# Patient Record
Sex: Female | Born: 1970 | Race: Black or African American | Hispanic: No | Marital: Single | Smoking: Never smoker
Health system: Southern US, Community
[De-identification: ages and names within clinical notes are randomized; demographics above are authoritative.]

## PROBLEM LIST (undated history)

## (undated) DIAGNOSIS — I10 Essential (primary) hypertension: Secondary | ICD-10-CM

## (undated) DIAGNOSIS — F419 Anxiety disorder, unspecified: Secondary | ICD-10-CM

## (undated) DIAGNOSIS — E785 Hyperlipidemia, unspecified: Secondary | ICD-10-CM

## (undated) DIAGNOSIS — E119 Type 2 diabetes mellitus without complications: Secondary | ICD-10-CM

## (undated) HISTORY — DX: Hyperlipidemia, unspecified: E78.5

## (undated) HISTORY — DX: Anxiety disorder, unspecified: F41.9

## (undated) HISTORY — PX: GALLBLADDER SURGERY: SHX652

## (undated) HISTORY — PX: ABDOMINAL HYSTERECTOMY: SHX81

---

## 2004-06-21 LAB — HM PAP SMEAR

## 2005-06-22 ENCOUNTER — Emergency Department: Payer: Self-pay | Admitting: Emergency Medicine

## 2005-07-07 ENCOUNTER — Ambulatory Visit: Payer: Self-pay | Admitting: Surgery

## 2008-03-11 ENCOUNTER — Ambulatory Visit: Payer: Self-pay | Admitting: Obstetrics and Gynecology

## 2008-03-17 ENCOUNTER — Inpatient Hospital Stay: Payer: Self-pay | Admitting: Obstetrics and Gynecology

## 2008-10-20 ENCOUNTER — Ambulatory Visit: Payer: Self-pay | Admitting: Internal Medicine

## 2008-10-24 ENCOUNTER — Emergency Department: Payer: Self-pay | Admitting: Emergency Medicine

## 2008-11-14 ENCOUNTER — Ambulatory Visit: Payer: Self-pay | Admitting: Internal Medicine

## 2008-11-17 ENCOUNTER — Ambulatory Visit: Payer: Self-pay | Admitting: Internal Medicine

## 2008-11-17 ENCOUNTER — Ambulatory Visit: Payer: Self-pay | Admitting: Gynecologic Oncology

## 2008-11-19 ENCOUNTER — Ambulatory Visit: Payer: Self-pay | Admitting: Internal Medicine

## 2008-12-01 ENCOUNTER — Ambulatory Visit: Payer: Self-pay | Admitting: Gynecologic Oncology

## 2008-12-16 ENCOUNTER — Emergency Department: Payer: Self-pay | Admitting: Emergency Medicine

## 2008-12-18 ENCOUNTER — Ambulatory Visit: Payer: Self-pay | Admitting: Internal Medicine

## 2009-10-02 ENCOUNTER — Emergency Department: Payer: Self-pay | Admitting: Emergency Medicine

## 2009-12-28 ENCOUNTER — Emergency Department: Payer: Self-pay | Admitting: Unknown Physician Specialty

## 2010-01-02 IMAGING — CR PELVIS - 1-2 VIEW
1 series · 1 of 1 positions shown · non-contrast
Comparison: none

REASON FOR EXAM: pelvic mass-for biopsy
COMMENTS:   LMP: Post Hysterectomy

[view not recorded]
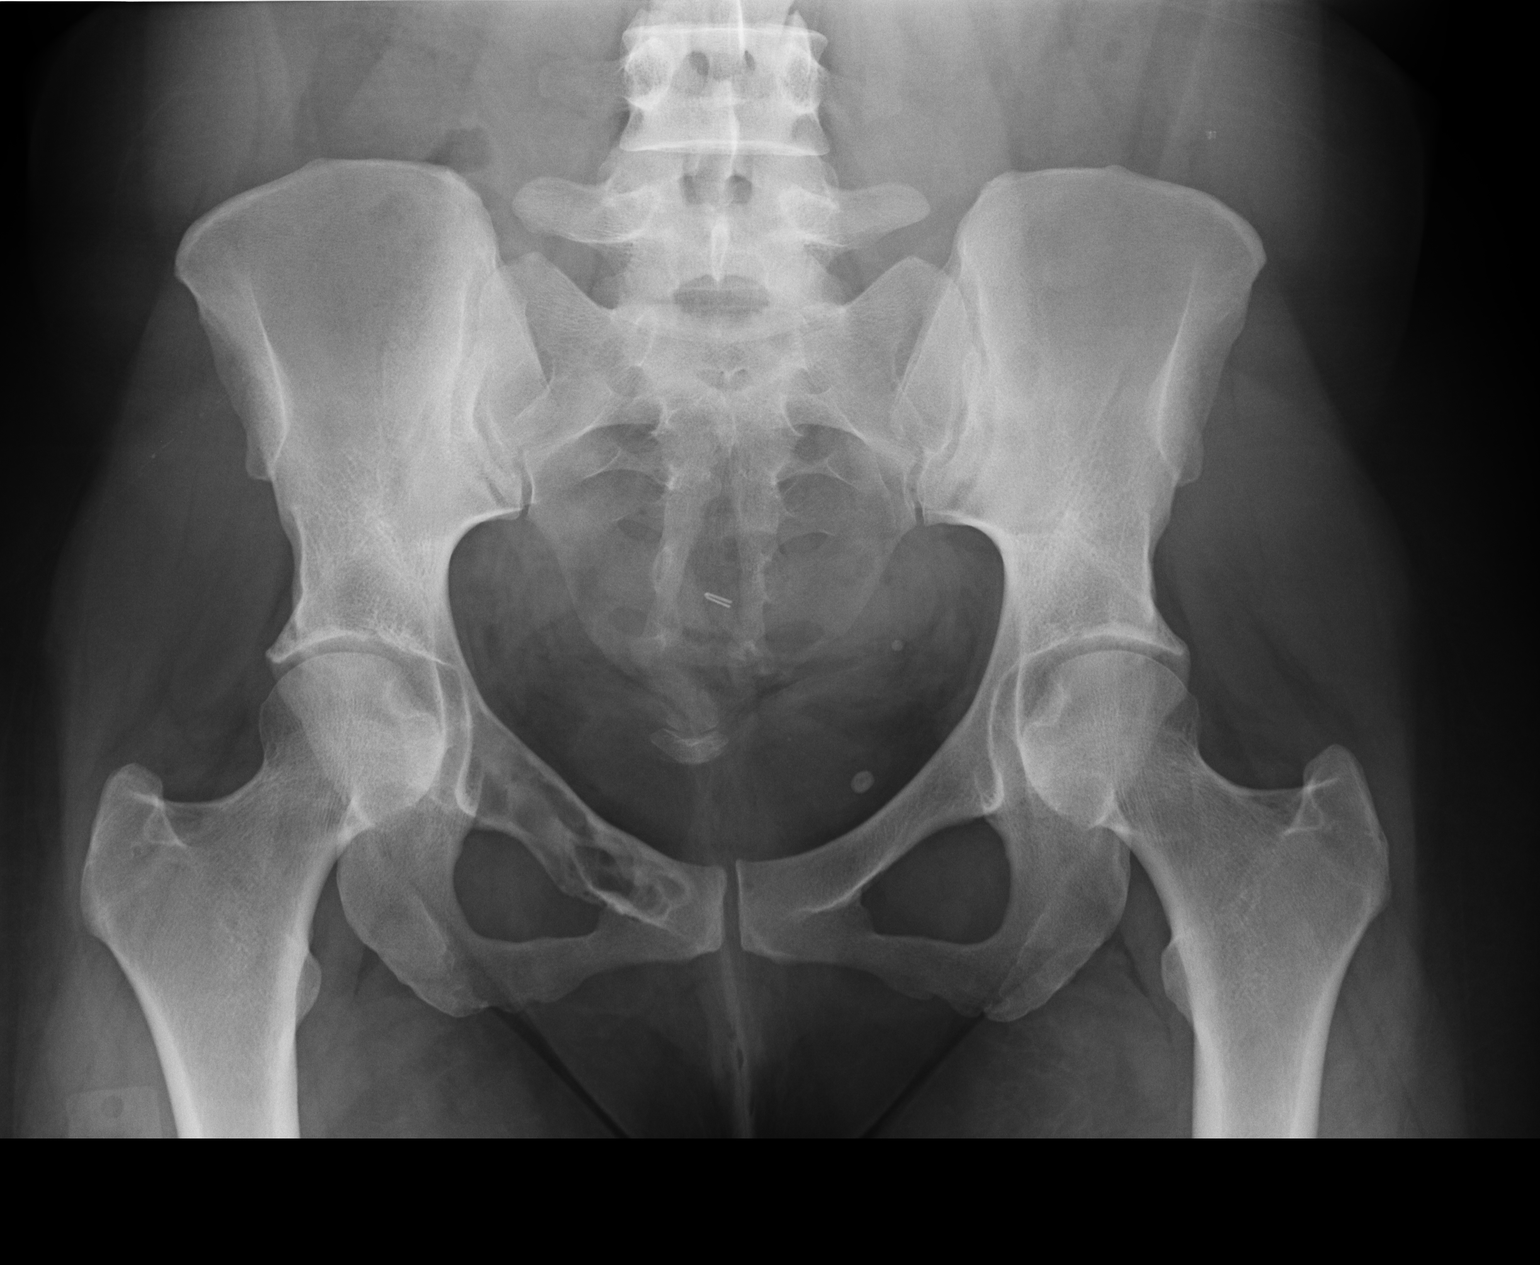

[1 of 1 positions shown; findings below may reference images not displayed]

PROCEDURE:     DXR - DXR PELVIS AP ONLY  - December 01, 2008  [DATE]

RESULT:     Single view of the pelvis is submitted and correlated with the
previous CT scan performed on 10/24/2008. Lucency is seen in the right
superior pubic ramus extending from the acetabulum to the pubic symphysis
region. There is suggestion of cortical disruption inferiorly secondary to
this lesion. The appearance is suggestive of a chondroid lesion. No other
focal bony abnormalities are evident. Surgical clip is seen in the pelvis in
the midline. The bowel gas pattern is unremarkable.
IMPRESSION: Findings suggestive of a chondroid lesion in the superior
pubic ramus as described possibly with cortical disruption. Orthopedic
oncologic surgical consultation is recommended.

## 2010-03-16 ENCOUNTER — Emergency Department: Payer: Self-pay | Admitting: Emergency Medicine

## 2011-04-25 ENCOUNTER — Ambulatory Visit: Payer: Self-pay

## 2011-05-17 ENCOUNTER — Ambulatory Visit: Payer: Self-pay

## 2011-06-22 ENCOUNTER — Emergency Department: Payer: Self-pay

## 2011-11-07 ENCOUNTER — Emergency Department: Payer: Self-pay | Admitting: *Deleted

## 2011-11-07 LAB — URINALYSIS, COMPLETE
Bacteria: NONE SEEN
Bilirubin,UR: NEGATIVE
Glucose,UR: NEGATIVE mg/dL (ref 0–75)
Ketone: NEGATIVE
Protein: NEGATIVE
Specific Gravity: 1.006 (ref 1.003–1.030)
Squamous Epithelial: 4
WBC UR: 2 /HPF (ref 0–5)

## 2012-01-19 DIAGNOSIS — F419 Anxiety disorder, unspecified: Secondary | ICD-10-CM | POA: Insufficient documentation

## 2012-08-23 ENCOUNTER — Emergency Department: Payer: Self-pay | Admitting: Internal Medicine

## 2012-09-14 ENCOUNTER — Ambulatory Visit: Payer: Self-pay | Admitting: Orthopedic Surgery

## 2012-11-29 ENCOUNTER — Emergency Department: Payer: Self-pay | Admitting: Emergency Medicine

## 2012-11-30 ENCOUNTER — Emergency Department: Payer: Self-pay | Admitting: Emergency Medicine

## 2013-05-25 ENCOUNTER — Emergency Department: Payer: Self-pay | Admitting: Emergency Medicine

## 2013-05-25 LAB — BASIC METABOLIC PANEL
Anion Gap: 6 — ABNORMAL LOW (ref 7–16)
BUN: 8 mg/dL (ref 7–18)
Calcium, Total: 8.7 mg/dL (ref 8.5–10.1)
Chloride: 104 mmol/L (ref 98–107)
Creatinine: 1.02 mg/dL (ref 0.60–1.30)
EGFR (African American): >60
EGFR (Non-African Amer.): >60
Glucose: 172 mg/dL — ABNORMAL HIGH (ref 65–99)
Osmolality: 274 (ref 275–301)
Sodium: 136 mmol/L (ref 136–145)

## 2013-05-25 LAB — CBC
HCT: 37.1 % (ref 35.0–47.0)
HGB: 12.6 g/dL (ref 12.0–16.0)
MCHC: 34 g/dL (ref 32.0–36.0)
MCV: 87 fL (ref 80–100)
Platelet: 203 10*3/uL (ref 150–440)
RBC: 4.29 10*6/uL (ref 3.80–5.20)
WBC: 5.9 10*3/uL (ref 3.6–11.0)

## 2013-05-25 LAB — URINALYSIS, COMPLETE
Bacteria: NONE SEEN
Bilirubin,UR: NEGATIVE
Blood: NEGATIVE
Glucose,UR: NEGATIVE mg/dL (ref 0–75)
Nitrite: NEGATIVE
Ph: 5 (ref 4.5–8.0)
RBC,UR: 6 /HPF (ref 0–5)
Specific Gravity: 1.017 (ref 1.003–1.030)

## 2013-05-27 LAB — URINE CULTURE

## 2013-05-29 ENCOUNTER — Emergency Department: Payer: Self-pay | Admitting: Internal Medicine

## 2013-05-29 ENCOUNTER — Ambulatory Visit: Payer: Self-pay | Admitting: Internal Medicine

## 2013-05-29 LAB — CBC
HCT: 41.8 %
HGB: 14 g/dL
MCH: 29.2 pg
MCHC: 33.4 g/dL
MCV: 87 fL
Platelet: 233 x10 3/mm 3
RBC: 4.78 X10 6/mm 3
RDW: 13.1 %
WBC: 6.5 x10 3/mm 3

## 2013-05-29 LAB — BASIC METABOLIC PANEL WITH GFR
Anion Gap: 5 — ABNORMAL LOW
BUN: 9 mg/dL
Calcium, Total: 9.3 mg/dL
Chloride: 102 mmol/L
Co2: 29 mmol/L
Creatinine: 0.92 mg/dL
EGFR (African American): >60
EGFR (Non-African Amer.): >60
Glucose: 144 mg/dL — ABNORMAL HIGH
Osmolality: 273
Potassium: 3.5 mmol/L
Sodium: 136 mmol/L

## 2013-06-12 ENCOUNTER — Emergency Department: Payer: Self-pay | Admitting: Emergency Medicine

## 2013-07-22 ENCOUNTER — Emergency Department: Payer: Self-pay | Admitting: Emergency Medicine

## 2013-07-22 LAB — URINALYSIS, COMPLETE
Bilirubin,UR: NEGATIVE
Ketone: NEGATIVE
Nitrite: NEGATIVE
Squamous Epithelial: 19
WBC UR: 5 /HPF (ref 0–5)

## 2014-04-29 ENCOUNTER — Emergency Department: Payer: Self-pay | Admitting: Emergency Medicine

## 2014-04-29 LAB — URINALYSIS, COMPLETE
BLOOD: NEGATIVE
Bacteria: NONE SEEN
Bilirubin,UR: NEGATIVE
GLUCOSE, UR: NEGATIVE mg/dL (ref 0–75)
Ketone: NEGATIVE
NITRITE: NEGATIVE
PH: 6 (ref 4.5–8.0)
Protein: NEGATIVE
RBC, UR: NONE SEEN /HPF (ref 0–5)
SPECIFIC GRAVITY: 1.012 (ref 1.003–1.030)
Squamous Epithelial: 18

## 2014-04-29 LAB — CBC
HCT: 36.9 % (ref 35.0–47.0)
HGB: 12 g/dL (ref 12.0–16.0)
MCH: 29.2 pg (ref 26.0–34.0)
MCHC: 32.6 g/dL (ref 32.0–36.0)
MCV: 90 fL (ref 80–100)
Platelet: 235 10*3/uL (ref 150–440)
RBC: 4.12 10*6/uL (ref 3.80–5.20)
RDW: 13.7 % (ref 11.5–14.5)
WBC: 5.7 10*3/uL (ref 3.6–11.0)

## 2014-04-29 LAB — BASIC METABOLIC PANEL
Anion Gap: 6 — ABNORMAL LOW (ref 7–16)
BUN: 9 mg/dL (ref 7–18)
CREATININE: 0.89 mg/dL (ref 0.60–1.30)
Calcium, Total: 8.7 mg/dL (ref 8.5–10.1)
Chloride: 109 mmol/L — ABNORMAL HIGH (ref 98–107)
Co2: 29 mmol/L (ref 21–32)
EGFR (African American): >60
Glucose: 150 mg/dL — ABNORMAL HIGH (ref 65–99)
Osmolality: 288 (ref 275–301)
POTASSIUM: 3.8 mmol/L (ref 3.5–5.1)
SODIUM: 144 mmol/L (ref 136–145)

## 2014-05-22 ENCOUNTER — Emergency Department: Payer: Self-pay | Admitting: Emergency Medicine

## 2014-05-22 LAB — URINALYSIS, COMPLETE
Bacteria: NONE SEEN
Bilirubin,UR: NEGATIVE
Blood: NEGATIVE
Glucose,UR: NEGATIVE mg/dL (ref 0–75)
Hyaline Cast: 1
KETONE: NEGATIVE
LEUKOCYTE ESTERASE: NEGATIVE
Nitrite: NEGATIVE
Ph: 5 (ref 4.5–8.0)
Protein: NEGATIVE
RBC, UR: NONE SEEN /HPF (ref 0–5)
SPECIFIC GRAVITY: 1.012 (ref 1.003–1.030)

## 2014-07-21 ENCOUNTER — Ambulatory Visit: Payer: Self-pay | Admitting: Obstetrics and Gynecology

## 2014-07-21 DIAGNOSIS — I10 Essential (primary) hypertension: Secondary | ICD-10-CM

## 2014-07-21 LAB — HEMOGLOBIN: HGB: 13.7 g/dL (ref 12.0–16.0)

## 2014-07-28 ENCOUNTER — Ambulatory Visit: Payer: Self-pay | Admitting: Obstetrics and Gynecology

## 2015-01-01 ENCOUNTER — Emergency Department
Admit: 2015-01-01 | Disposition: A | Discharge status: Home / Self Care | Payer: Self-pay | Admitting: Emergency Medicine

## 2015-01-01 LAB — URINALYSIS, COMPLETE
Bilirubin,UR: NEGATIVE
Blood: NEGATIVE
Glucose,UR: NEGATIVE mg/dL (ref 0–75)
Ketone: NEGATIVE
NITRITE: NEGATIVE
Ph: 6 (ref 4.5–8.0)
Protein: NEGATIVE
SPECIFIC GRAVITY: 1.015 (ref 1.003–1.030)

## 2015-01-01 LAB — COMPREHENSIVE METABOLIC PANEL
ALT: 29 U/L
Albumin: 3.7 g/dL
Alkaline Phosphatase: 67 U/L
Anion Gap: 4 — ABNORMAL LOW (ref 7–16)
BUN: 10 mg/dL
Bilirubin,Total: 0.3 mg/dL
CALCIUM: 8.7 mg/dL — AB
CO2: 27 mmol/L
Chloride: 108 mmol/L
Creatinine: 0.75 mg/dL
EGFR (African American): >60
Glucose: 146 mg/dL — ABNORMAL HIGH
Potassium: 3.7 mmol/L
SGOT(AST): 24 U/L
Sodium: 139 mmol/L
Total Protein: 7.7 g/dL

## 2015-01-01 LAB — CBC WITH DIFFERENTIAL/PLATELET
Basophil #: 0 10*3/uL (ref 0.0–0.1)
Basophil %: 0.6 %
EOS ABS: 0.2 10*3/uL (ref 0.0–0.7)
Eosinophil %: 4 %
HCT: 35.6 % (ref 35.0–47.0)
HGB: 11.9 g/dL — ABNORMAL LOW (ref 12.0–16.0)
Lymphocyte #: 2.3 10*3/uL (ref 1.0–3.6)
Lymphocyte %: 40 %
MCH: 28.7 pg (ref 26.0–34.0)
MCHC: 33.3 g/dL (ref 32.0–36.0)
MCV: 86 fL (ref 80–100)
MONOS PCT: 11.8 %
Monocyte #: 0.7 x10 3/mm (ref 0.2–0.9)
NEUTROS ABS: 2.5 10*3/uL (ref 1.4–6.5)
NEUTROS PCT: 43.6 %
PLATELETS: 211 10*3/uL (ref 150–440)
RBC: 4.13 10*6/uL (ref 3.80–5.20)
RDW: 14.2 % (ref 11.5–14.5)
WBC: 5.7 10*3/uL (ref 3.6–11.0)

## 2015-01-01 LAB — PROTIME-INR
INR: 0.9
Prothrombin Time: 12.5 s

## 2015-01-01 LAB — TROPONIN I

## 2015-01-01 LAB — APTT: Activated PTT: 28.5 s

## 2015-01-10 NOTE — Op Note (Signed)
PATIENT NAME:  Kelly [name], Kelly [name] MR#:  [id number] DATE OF BIRTH:  11/30/1970  DATE OF PROCEDURE:  07/28/2014  PREOPERATIVE DIAGNOSES:  1.  Right ovarian cyst.  2.  Pelvic pain.   POSTOPERATIVE DIAGNOSES: 1.  Right ovarian cyst.  2.  Pelvic pain.  3.  Dense pelvic adhesions.  OPERATIONS: Include:  1.  Right salpingo-oophorectomy.  2.  Lysis of adhesions.  3.  Mini laparotomy.   ANESTHESIA: General.   SURGEON: Chiquitta Matty S. Valentino Saxonherry, MD   CONSULTING SURGEON: Claude MangesWilliam F. Marterre, MD   ASSISTANT: Prentice DockerMartin [name]. DeFrancesco, MD   ESTIMATED BLOOD LOSS: 100 mL  OPERATIVE FLUIDS: 1300 mL  URINE OUTPUT: 25 mL  COMPLICATIONS: None.   FINDINGS: An enlarged right ovary with cystic component, approximately 6 to 7 cm. There were dense adhesions of the right ovary and cyst to the bowel. Uterus and left ovary with fallopian tube were surgically absent.   SPECIMEN: Right tube and ovary.   CONDITION: Stable.   PROCEDURE: The patient was taken to the operating room where she was placed under general anesthesia without difficulty. She was then prepped and draped in the normal sterile fashion and placed in dorsal lithotomy position using Allen stirrups. [name] straight catheterization was performed with return of 25 mL of clear urine. Next, [name] sponge stick was placed in the patient vagina for vaginal cuff manipulation. Attention was then turned to the abdomen. [name] 5 mm infraumbilical vertical incision was then made using the scalpel and [name] 5 mm trocar with sheath was inserted directly into the patient's abdomen under direct visualization using the laparoscope. Once entrance into the abdominal cavity had been confirmed, the abdomen was insufflated with CO2 gas appropriately. After adequate insufflation, the abdominal cavity was surveyed with the above findings noted.  Next, [name] 5 mm trocar was then inserted on the left lateral aspect of the patient's abdomen just lateral to the rectus abdominis muscles, careful to avoid  any vessels. Attention was then turned to the right side, where in [name] similar fashion an 11 mm trocar was then inserted into the patient's right lower abdominal cavity just lateral to the rectus abdominis muscles and careful to avoid any vasculature. [name] grasper was used to attempt to lift the bowel from the ovary; however, there were noted to be dense adhesions. The adhesions were noted to be from the epiploic appendices to the vaginal cuff. These adhesions were transected using the Harmonic scalpel. The ovarian cyst was then able to be grasped using [name] tenaculum. However, it was still noted that dense adhesions to the large bowel were present, and the ovarian cyst was difficult to mobilize adequately. At this time due to the difficulty in identifying [name] clear plane for transection of and lysis of adhesions, the decision was made to consult general surgery.  Dr. Duwaine MaxinWilliam Marterre, General Surgeon, responded to the call and scrubbed for the procedure. Lysis of adhesions of the bowel to the right ovary was performed using sharp dissection with the monopolar scissors as well as the Harmonic scalpel and blunt dissection. Once all of the adhesions had been freed, attention was then turned back to removal of the ovary and mass. Please see Dr. Hoy FinlayMarterre's dictation for lysis of adhesions procedure for bowel adhesiolysis. Once the bowel had been freed from the ovary, the ovary was elevated. The right infundibulopelvic ligament was then transected and ligated using the Harmonic scalpel. Once the right ovary was noted to be free and completely transected, the Endo Catch bag was then  inserted into the 11 mm port and attempts were made to place the ovarian cyst into the Endo Catch bag; however, the cyst was noted to be too large for the specimen bag. Next, an attempt was made to drain the cyst using [name] needle aspirator; however, adequate tension could not be put on the cyst and the cyst was unable to be drained.  The decision was  then made to perform [name] mini laparotomy to evacuate the large adnexal cyst, so attention was then turned to the suprapubic region where [name] suprapubic 5 mm port had been placed previously. This port was removed and the decision was made to perform [name] mini laparotomy at the site of the prior hysterectomy scar incision. [name] small, approximately 5 cm incision was made in the suprapubic region using the scalpel, and the incision was carried down to the level of the fascia using the Bovie. The Bovie was then incised and the rectus abdominis muscles were separated in the midline. The peritoneum was then grasped, tented up, and entered bluntly. The CO2 gas was expressed at this time. The cyst was then elevated up to the level of the incision using the laparoscopic tenaculum. [name] single-tooth tenaculum was then used to grasp lower on the ovarian cyst, and attempts were made to deliver the ovarian cyst through the mini laparotomy incision. Once the ovarian cyst was removed from the patient's abdomen, irrigation was performed at the site of removal.  After this, the fascia was then closed using [name] running suture of 0 Vicryl, and the skin was then closed using 4-0 Monocryl. Once the incision was closed, attention was turned back to the laparoscopy, where the final survey was performed in the lower pelvic region. There was some slight oozing at the site of adhesiolysis on the bowel; however, this was noted to be very minimal. The remainder of the abdomen was noted to be hemostatic. The lateral 5 mm trocars were then removed at this time and the carbon dioxide was allowed to express. After this, the 11 mm and 5 mm trocars were removed and the CO2 gas was expressed; after this, the 5 mm infraumbilical trocar was then removed, all under direct visualization with good hemostasis noted. Next, the 11 mm trocar was closed. The fascia and subcutaneous tissue were closed using [name] suture of 2-0 Vicryl in [name] figure-of-eight fashion and then the  skin was closed using [name] subcuticular layer of 4-0 Monocryl. Each incision was injected with 1% lidocaine with epinephrine, with [name] total of 20 mL injected. The laparoscopic incisions were closed using Dermabond. The sponge stick was removed from the patient's vagina prior. The patient was awakened and taken to the recovery room in stable condition. All instrument, needle and sponge counts were correct x 2 at the end of the procedure.    ____________________________ Jacques Earthly. Valentino Saxon, MD asc:ST D: 07/28/2014 21:52:21 ET T: 07/28/2014 22:23:22 ET JOB#: [id number]  cc: Jacques Earthly. Valentino Saxon, MD, <Dictator> Fabian November MD ELECTRONICALLY SIGNED 08/11/2014 9:56

## 2015-01-10 NOTE — Op Note (Signed)
PATIENT NAME:  [name], Ashira A MR#:  [id number] DATE OF BIRTH:  April 01, 1971  DATE OF PROCEDURE:  07/28/2014  OPERATION PERFORMED: Laparoscopic lysis of adhesions.   PROCEDURE IN DETAIL: I was called into the operating room by the gynecologists who were performing a laparoscopic oophorectomy and noted fairly dense adhesions to a loop of distal small intestine to the ovary in question. I dissected this off with a combination of non-cautery scissors, cauterized scissors, and the Harmonic scalpel in such a fashion that the serosa of the intestine was not harmed. The remainder of the procedure will be dictated by the gynecologists including the preoperative and postoperative diagnosis, indications, etc.    ____________________________ Claude MangesWilliam F. Yuuki Skeens, MD wfm:LT D: 08/03/2014 15:32:22 ET T: 08/03/2014 19:04:36 ET JOB#: [id number]  cc: Claude MangesWilliam F. Jaycub Noorani, MD, <Dictator> Jacques EarthlyAnika S. Valentino Saxonherry, MD Claude MangesWILLIAM F Jaela Yepez MD ELECTRONICALLY SIGNED 08/11/2014 20:39

## 2015-01-12 LAB — SURGICAL PATHOLOGY

## 2015-06-26 ENCOUNTER — Other Ambulatory Visit: Payer: Self-pay | Admitting: Obstetrics and Gynecology

## 2015-08-06 ENCOUNTER — Other Ambulatory Visit: Payer: Self-pay | Admitting: Physician Assistant

## 2015-08-06 DIAGNOSIS — Z1231 Encounter for screening mammogram for malignant neoplasm of breast: Secondary | ICD-10-CM

## 2015-08-18 ENCOUNTER — Ambulatory Visit: Payer: Medicaid Other | Attending: Physician Assistant

## 2015-08-19 ENCOUNTER — Emergency Department
Admission: EM | Admit: 2015-08-19 | Discharge: 2015-08-19 | Disposition: A | Discharge status: Home / Self Care | Payer: Medicaid Other | Attending: Student | Admitting: Student

## 2015-08-19 ENCOUNTER — Encounter: Payer: Self-pay | Admitting: Emergency Medicine

## 2015-08-19 DIAGNOSIS — I10 Essential (primary) hypertension: Secondary | ICD-10-CM | POA: Insufficient documentation

## 2015-08-19 DIAGNOSIS — Z7984 Long term (current) use of oral hypoglycemic drugs: Secondary | ICD-10-CM | POA: Insufficient documentation

## 2015-08-19 DIAGNOSIS — J209 Acute bronchitis, unspecified: Secondary | ICD-10-CM | POA: Insufficient documentation

## 2015-08-19 DIAGNOSIS — E119 Type 2 diabetes mellitus without complications: Secondary | ICD-10-CM | POA: Diagnosis not present

## 2015-08-19 DIAGNOSIS — Z79899 Other long term (current) drug therapy: Secondary | ICD-10-CM | POA: Insufficient documentation

## 2015-08-19 DIAGNOSIS — R05 Cough: Secondary | ICD-10-CM | POA: Diagnosis present

## 2015-08-19 HISTORY — DX: Type 2 diabetes mellitus without complications: E11.9

## 2015-08-19 HISTORY — DX: Essential (primary) hypertension: I10

## 2015-08-19 MED ORDER — AZITHROMYCIN 250 MG PO TABS: ORAL_TABLET | ORAL | Status: DC

## 2015-08-19 MED ORDER — GUAIFENESIN-CODEINE 100-10 MG/5ML PO SOLN: 10.0000 mL | Freq: Three times a day (TID) | ORAL | Status: DC | PRN

## 2015-08-19 MED ORDER — ALBUTEROL SULFATE HFA 108 (90 BASE) MCG/ACT IN AERS: 2.0000 | INHALATION_SPRAY | Freq: Four times a day (QID) | RESPIRATORY_TRACT | Status: DC | PRN

## 2015-08-19 NOTE — Discharge Instructions (Signed)

## 2015-08-19 NOTE — ED Provider Notes (Signed)
[facility] Emergency Department Provider Note ____________________________________________  Time seen: Approximately 8:02 PM  I have reviewed the triage vital signs and the nursing notes.   HISTORY  Chief Complaint Cough   HPI RUTHE ROEMER is a 44 y.o. female who presents to the emergency department for evaluation ofa nonproductive cough and wheezing with occasional shortness of breath for the past week. Symptoms are worse at night and when awakening. She denies fever. She had no relief with over-the-counter medications.   Past Medical History  Diagnosis Date  . Hypertension   . Diabetes mellitus without complication (HCC)     There are no active problems to display for this patient.   Past Surgical History  Procedure Laterality Date  . Abdominal hysterectomy      Current Outpatient Rx  Name  Route  Sig  Dispense  Refill  . hydrochlorothiazide (HYDRODIURIL) 25 MG tablet   Oral   Take 25 mg by mouth daily.         [name] lisinopril (PRINIVIL,ZESTRIL) 40 MG tablet   Oral   Take 40 mg by mouth daily.         . metFORMIN (GLUCOPHAGE) 500 MG tablet   Oral   Take by mouth 2 (two) times daily with a meal.         . albuterol (PROVENTIL HFA;VENTOLIN HFA) 108 (90 BASE) MCG/ACT inhaler   Inhalation   Inhale 2 puffs into the lungs every 6 (six) hours as needed for wheezing or shortness of breath.   1 Inhaler   0   . azithromycin (ZITHROMAX) 250 MG tablet      2 tablets today, then 1 tablet for the next 4 days.   6 each   0   . guaiFENesin-codeine 100-10 MG/5ML syrup   Oral   Take 10 mLs by mouth 3 (three) times daily as needed.   120 mL   0     Allergies Review of patient's allergies indicates no known allergies.  No family history on file.  Social History Social History  Substance Use Topics  . Smoking status: Never Smoker   . Smokeless tobacco: None  . Alcohol Use: No    Review of Systems Constitutional: No  fever/chills Eyes: No visual changes. ENT: No sore throat. Cardiovascular: Denies chest pain. Respiratory: Occasional shortness of breath. Positive for cough. Gastrointestinal: Negative for abdominal pain. Negative for nausea,  negative for vomiting.  Negative for diarrhea.  Genitourinary: Negative for dysuria. Musculoskeletal: Negative for body aches Skin: Negative for rash. Neurological: Negative for headaches, Negative for focal weakness or numbness.  10-point ROS otherwise negative.  ____________________________________________   PHYSICAL EXAM:  VITAL SIGNS: ED Triage Vitals  Enc Vitals Group     BP 08/19/15 1919 134/90 mmHg     Pulse Rate 08/19/15 1919 90     Resp 08/19/15 1919 20     Temp 08/19/15 1919 98.4 F (36.9 C)     Temp Source 08/19/15 1919 Oral     SpO2 08/19/15 1919 100 %     Weight 08/19/15 1919 180 lb (81.647 kg)     Height 08/19/15 1919  (1.626 m)     Head Cir --      Peak Flow --      Pain Score --      Pain Loc --      Pain Edu? --      Excl. in GC? --     Constitutional: Alert and oriented. Well  appearing and in no acute distress. Eyes: Conjunctivae are normal. PERRL. EOMI. Head: Atraumatic. Nose: No congestion/rhinnorhea. Mouth/Throat: Mucous membranes are moist.  Oropharynx non-erythematous. Neck: No stridor.  Lymphatic: No cervical lymphadenopathy. Cardiovascular: Normal rate, regular rhythm. Grossly normal heart sounds.  Good peripheral circulation. Respiratory: Normal respiratory effort.  No retractions. Lungs clear to auscultation bilaterally. Gastrointestinal: Soft and nontender. No distention. No abdominal bruits. No CVA tenderness. Musculoskeletal: No joint pain reported. Neurologic:  Normal speech and language. No gross focal neurologic deficits are appreciated. Speech is normal. No gait instability. Skin:  Skin is warm, dry and intact. No rash noted. Psychiatric: Mood and affect are normal. Speech and behavior are  normal.  ____________________________________________   LABS (all labs ordered are listed, but only abnormal results are displayed)  Labs Reviewed - No data to display ____________________________________________  EKG   ____________________________________________  RADIOLOGY  Not indicated ____________________________________________   PROCEDURES  Procedure(s) performed: None  Critical Care performed: No  ____________________________________________   INITIAL IMPRESSION / ASSESSMENT AND PLAN / ED COURSE  Pertinent labs & imaging results that were available during my care of the patient were reviewed by me and considered in my medical decision making (see chart for details).   Patient will be started on azithromycin and given an albuterol inhaler. She will have codeine with guaifenesin to take for cough as needed. She was encouraged to follow up with her primary care provider for symptoms that are not improving over the next 7 days. She was advised to return to the emergency department for symptoms that change or worsen if she is unable schedule an appointment. ____________________________________________   FINAL CLINICAL IMPRESSION(S) / ED DIAGNOSES  Final diagnoses:  Acute bronchitis, unspecified organism       Chinita PesterCari B Shahira Fiske, FNP 08/19/15 2011  Gayla DossEryka A Gayle, MD 08/20/15 [phone]

## 2015-08-19 NOTE — ED Notes (Addendum)
Patient ambulatory to triage with steady gait, without difficulty or distress noted; pt reports nonprod cough x week with no fever; nonsmoker; resp even/unlab, lungs clear, denies CP

## 2015-08-23 ENCOUNTER — Emergency Department: Payer: Medicaid Other

## 2015-08-23 ENCOUNTER — Emergency Department
Admission: EM | Admit: 2015-08-23 | Discharge: 2015-08-23 | Disposition: A | Discharge status: Home / Self Care | Payer: Medicaid Other | Attending: Emergency Medicine | Admitting: Emergency Medicine

## 2015-08-23 ENCOUNTER — Encounter: Payer: Self-pay | Admitting: Emergency Medicine

## 2015-08-23 DIAGNOSIS — Z7984 Long term (current) use of oral hypoglycemic drugs: Secondary | ICD-10-CM | POA: Diagnosis not present

## 2015-08-23 DIAGNOSIS — E119 Type 2 diabetes mellitus without complications: Secondary | ICD-10-CM | POA: Diagnosis not present

## 2015-08-23 DIAGNOSIS — Y92512 Supermarket, store or market as the place of occurrence of the external cause: Secondary | ICD-10-CM | POA: Insufficient documentation

## 2015-08-23 DIAGNOSIS — Y998 Other external cause status: Secondary | ICD-10-CM | POA: Diagnosis not present

## 2015-08-23 DIAGNOSIS — W500XXA Accidental hit or strike by another person, initial encounter: Secondary | ICD-10-CM | POA: Diagnosis not present

## 2015-08-23 DIAGNOSIS — S93602A Unspecified sprain of left foot, initial encounter: Secondary | ICD-10-CM | POA: Insufficient documentation

## 2015-08-23 DIAGNOSIS — I10 Essential (primary) hypertension: Secondary | ICD-10-CM | POA: Diagnosis not present

## 2015-08-23 DIAGNOSIS — Y9389 Activity, other specified: Secondary | ICD-10-CM | POA: Insufficient documentation

## 2015-08-23 DIAGNOSIS — S99912A Unspecified injury of left ankle, initial encounter: Secondary | ICD-10-CM | POA: Diagnosis present

## 2015-08-23 DIAGNOSIS — Z79899 Other long term (current) drug therapy: Secondary | ICD-10-CM | POA: Insufficient documentation

## 2015-08-23 MED ORDER — HYDROCODONE-ACETAMINOPHEN 5-325 MG PO TABS: 1.0000 | ORAL_TABLET | ORAL | Status: DC | PRN

## 2015-08-23 MED ORDER — IBUPROFEN 800 MG PO TABS: 800.0000 mg | ORAL_TABLET | Freq: Three times a day (TID) | ORAL | Status: DC | PRN

## 2015-08-23 NOTE — ED Provider Notes (Signed)
[facility] Emergency Department Provider Note ____________________________________________  Time seen: Approximately 10:59 AM  I have reviewed the triage vital signs and the nursing notes.   HISTORY  Chief Complaint Ankle Pain   HPI Kelly [name] is a 44 y.o. female who presents to emergency department for evaluation of left ankle pain. [place] and she attempted to catch her but instead the lady fell onto her left ankle. She states that she heard something pop. She has had a fracture in that ankle last year. She is unable to bear weight on the ankle without significant pain.   Past Medical History  Diagnosis Date  . Hypertension   . Diabetes mellitus without complication (HCC)     There are no active problems to display for this patient.   Past Surgical History  Procedure Laterality Date  . Abdominal hysterectomy      Current Outpatient Rx  Name  Route  Sig  Dispense  Refill  . hydrochlorothiazide (HYDRODIURIL) 25 MG tablet   Oral   Take 25 mg by mouth daily.         [name]. HYDROcodone-acetaminophen (NORCO/VICODIN) 5-325 MG tablet   Oral   Take 1 tablet by mouth every 4 (four) hours as needed.   12 tablet   0   . ibuprofen (ADVIL,MOTRIN) 800 MG tablet   Oral   Take 1 tablet (800 mg total) by mouth every 8 (eight) hours as needed.   30 tablet   0   . lisinopril (PRINIVIL,ZESTRIL) 40 MG tablet   Oral   Take 40 mg by mouth daily.         . metFORMIN (GLUCOPHAGE) 500 MG tablet   Oral   Take by mouth 2 (two) times daily with a meal.           Allergies Review of patient's allergies indicates no known allergies.  No family history on file.  Social History Social History  Substance Use Topics  . Smoking status: Never Smoker   . Smokeless tobacco: None  . Alcohol Use: No    Review of Systems Constitutional: No recent illness. Eyes: No visual changes. ENT:  No sore throat. Cardiovascular: Denies chest pain or palpitations. Respiratory: Denies shortness of breath. Gastrointestinal: No abdominal pain.  Genitourinary: Negative for dysuria. Musculoskeletal: Pain in left ankle Skin: Negative for rash. Neurological: Negative for headaches, focal weakness or numbness. 10-point ROS otherwise negative.  ____________________________________________   PHYSICAL EXAM:  VITAL SIGNS: ED Triage Vitals  Enc Vitals Group     BP --      Pulse --      Resp --      Temp --      Temp src --      SpO2 --      Weight --      Height --      Head Cir --      Peak Flow --      Pain Score 08/23/15 1037 7     Pain Loc --      Pain Edu? --      Excl. in GC? --     Constitutional: Alert and oriented. Well appearing and in no acute distress. Eyes: Conjunctivae are normal. EOMI. Head: Atraumatic. Nose: No congestion/rhinnorhea. Neck: No stridor.  Respiratory: Normal respiratory effort.   Musculoskeletal: Tenderness and swelling over the left lateral malleolus. Neurologic:  Normal speech and language. No gross  focal neurologic deficits are appreciated. Speech is normal. No gait instability. Skin:  Skin is warm, dry and intact. Atraumatic. Psychiatric: Mood and affect are normal. Speech and behavior are normal.  ____________________________________________   LABS (all labs ordered are listed, but only abnormal results are displayed)  Labs Reviewed - No data to display ____________________________________________  RADIOLOGY  Left foot and ankle negative for bony abnormality per radiology. ____________________________________________   PROCEDURES  Procedure(s) performed: None   ____________________________________________   INITIAL IMPRESSION / ASSESSMENT AND PLAN / ED COURSE  Pertinent labs & imaging results that were available during my care of the patient were reviewed by me and considered in my medical decision making (see chart for  details).  Patient was advised to follow-up with orthopedist for symptoms that are not improving over the next week. She was advised to return to the emergency department for symptoms that change or worsen if she is unable schedule an appointment. ____________________________________________   FINAL CLINICAL IMPRESSION(S) / ED DIAGNOSES  Final diagnoses:  Foot sprain, left, initial encounter       Chinita Pester, FNP 08/23/15 1227  Sharyn Creamer, MD 08/23/15 [phone]

## 2015-08-23 NOTE — ED Notes (Signed)
Left anklep ain since yesterday  States someone stepped on ankle yesterday

## 2015-08-23 NOTE — Discharge Instructions (Signed)

## 2015-11-20 ENCOUNTER — Emergency Department
Admission: EM | Admit: 2015-11-20 | Discharge: 2015-11-20 | Disposition: A | Discharge status: Home / Self Care | Payer: Medicaid Other | Attending: Emergency Medicine | Admitting: Emergency Medicine

## 2015-11-20 ENCOUNTER — Encounter: Payer: Self-pay | Admitting: Emergency Medicine

## 2015-11-20 ENCOUNTER — Emergency Department: Payer: Medicaid Other

## 2015-11-20 DIAGNOSIS — Z7984 Long term (current) use of oral hypoglycemic drugs: Secondary | ICD-10-CM | POA: Insufficient documentation

## 2015-11-20 DIAGNOSIS — S39012A Strain of muscle, fascia and tendon of lower back, initial encounter: Secondary | ICD-10-CM | POA: Diagnosis not present

## 2015-11-20 DIAGNOSIS — Y9289 Other specified places as the place of occurrence of the external cause: Secondary | ICD-10-CM | POA: Diagnosis not present

## 2015-11-20 DIAGNOSIS — Z79899 Other long term (current) drug therapy: Secondary | ICD-10-CM | POA: Insufficient documentation

## 2015-11-20 DIAGNOSIS — S3992XA Unspecified injury of lower back, initial encounter: Secondary | ICD-10-CM | POA: Diagnosis present

## 2015-11-20 DIAGNOSIS — N3090 Cystitis, unspecified without hematuria: Secondary | ICD-10-CM | POA: Diagnosis not present

## 2015-11-20 DIAGNOSIS — E119 Type 2 diabetes mellitus without complications: Secondary | ICD-10-CM | POA: Insufficient documentation

## 2015-11-20 DIAGNOSIS — I10 Essential (primary) hypertension: Secondary | ICD-10-CM | POA: Insufficient documentation

## 2015-11-20 DIAGNOSIS — Y9389 Activity, other specified: Secondary | ICD-10-CM | POA: Insufficient documentation

## 2015-11-20 DIAGNOSIS — X58XXXA Exposure to other specified factors, initial encounter: Secondary | ICD-10-CM | POA: Insufficient documentation

## 2015-11-20 DIAGNOSIS — Y998 Other external cause status: Secondary | ICD-10-CM | POA: Insufficient documentation

## 2015-11-20 LAB — URINALYSIS COMPLETE WITH MICROSCOPIC (ARMC ONLY)
Bilirubin Urine: NEGATIVE
Glucose, UA: NEGATIVE mg/dL
Hgb urine dipstick: NEGATIVE
Ketones, ur: NEGATIVE mg/dL
NITRITE: NEGATIVE
PROTEIN: NEGATIVE mg/dL
SPECIFIC GRAVITY, URINE: 1.01 (ref 1.005–1.030)
pH: 5 (ref 5.0–8.0)

## 2015-11-20 MED ORDER — SULFAMETHOXAZOLE-TRIMETHOPRIM 800-160 MG PO TABS: 1.0000 | ORAL_TABLET | Freq: Two times a day (BID) | ORAL | Status: DC

## 2015-11-20 MED ORDER — OXYCODONE-ACETAMINOPHEN 5-325 MG PO TABS: 1.0000 | ORAL_TABLET | ORAL | Status: DC | PRN

## 2015-11-20 MED ORDER — KETOROLAC TROMETHAMINE 30 MG/ML IJ SOLN
30.0000 mg | Freq: Once | INTRAMUSCULAR | Status: AC
  Administered 2015-11-20: 30 mg via INTRAMUSCULAR
  Filled 2015-11-20: qty 1

## 2015-11-20 MED ORDER — IBUPROFEN 800 MG PO TABS: 800.0000 mg | ORAL_TABLET | Freq: Three times a day (TID) | ORAL | Status: DC | PRN

## 2015-11-20 MED ORDER — CYCLOBENZAPRINE HCL 10 MG PO TABS: 10.0000 mg | ORAL_TABLET | Freq: Three times a day (TID) | ORAL | Status: DC | PRN

## 2015-11-20 NOTE — ED Provider Notes (Signed)
[facility] Emergency Department Provider Note  ____________________________________________  Time seen: Approximately 12:38 PM  I have reviewed the triage vital signs and the nursing notes.   HISTORY  Chief Complaint Back Pain    HPI Kelly [name] is a [age] y.o. female who presents with a two-week history of right lower back pain with sudden exacerbation today that took her breath away. Patient denies any shortness of breath denies any chest pain but still has had right flank pain. Past medical history is significant for kidney stones. Denies any dysuria or any urinary symptoms. Pain does not seem to be triggered by movement or position and does not seem to be relieved by rest. Has not taken anything over-the-counter to help with the pain at this time.   Past Medical History  Diagnosis Date  . Hypertension   . Diabetes mellitus without complication (HCC)     There are no active problems to display for this patient.   Past Surgical History  Procedure Laterality Date  . Abdominal hysterectomy      Current Outpatient Rx  Name  Route  Sig  Dispense  Refill  . cyclobenzaprine (FLEXERIL) 10 MG tablet   Oral   Take 1 tablet (10 mg total) by mouth every 8 (eight) hours as needed for muscle spasms.   30 tablet   1   . hydrochlorothiazide (HYDRODIURIL) 25 MG tablet   Oral   Take 25 mg by mouth daily.         [name]. ibuprofen (ADVIL,MOTRIN) 800 MG tablet   Oral   Take 1 tablet (800 mg total) by mouth every 8 (eight) hours as needed.   30 tablet   0   . lisinopril (PRINIVIL,ZESTRIL) 40 MG tablet   Oral   Take 40 mg by mouth daily.         . metFORMIN (GLUCOPHAGE) 500 MG tablet   Oral   Take by mouth 2 (two) times daily with a meal.         . oxyCODONE-acetaminophen (ROXICET) 5-325 MG tablet   Oral   Take 1-2 tablets by mouth every 4 (four) hours as needed for severe pain.   15 tablet   0   . sulfamethoxazole-trimethoprim (BACTRIM  DS,SEPTRA DS) 800-160 MG tablet   Oral   Take 1 tablet by mouth 2 (two) times daily.   20 tablet   0     Allergies Review of patient's allergies indicates no known allergies.  No family history on file.  Social History Social History  Substance Use Topics  . Smoking status: Never Smoker   . Smokeless tobacco: None  . Alcohol Use: No    Review of Systems Constitutional: No fever/chills Eyes: No visual changes. ENT: No sore throat. Cardiovascular: Denies chest pain. Respiratory: Denies shortness of breath. Gastrointestinal: No abdominal pain.  No nausea, no vomiting.  No diarrhea.  No constipation. Genitourinary: Negative for dysuria. Musculoskeletal: Positive for right flank pain Skin: Negative for rash. Neurological: Negative for headaches, focal weakness or numbness.  10-point ROS otherwise negative.  ____________________________________________   PHYSICAL EXAM:  VITAL SIGNS: ED Triage Vitals  Enc Vitals Group     BP 11/20/15 1215 120/91 mmHg     Pulse Rate 11/20/15 1215 67     Resp 11/20/15 1215 20     Temp 11/20/15 1215 98.2 F (36.8 C)     Temp Source 11/20/15 1215 Oral     SpO2 11/20/15 1215 97 %  Weight 11/20/15 1215 160 lb (72.576 kg)     Height 11/20/15 1215  (1.676 m)     Head Cir --      Peak Flow --      Pain Score 11/20/15 1216 9     Pain Loc --      Pain Edu? --      Excl. in GC? --     Constitutional: Alert and oriented. Well appearing and in no acute distress.  Cardiovascular: Normal rate, regular rhythm. Grossly normal heart sounds.  Good peripheral circulation. Respiratory: Normal respiratory effort.  No retractions. Lungs CTAB. Gastrointestinal: Soft and nontender. No distention. No abdominal bruits. Positive CVA tenderness to the right Musculoskeletal: No lower extremity tenderness nor edema.  No joint effusions. Straight leg raise negative distally neurovascularly intact. Point tenderness noted to the right lumbar paraspinal  costovertebral area. Neurologic:  Normal speech and language. No gross focal neurologic deficits are appreciated. No gait instability. Skin:  Skin is warm, dry and intact. No rash noted. Psychiatric: Mood and affect are normal. Speech and behavior are normal.  ____________________________________________   LABS (all labs ordered are listed, but only abnormal results are displayed)  Labs Reviewed  URINALYSIS COMPLETEWITH MICROSCOPIC ([facility] ONLY) - Abnormal; Notable for the following:    Color, Urine YELLOW (*)    APPearance HAZY (*)    Leukocytes, UA TRACE (*)    Bacteria, UA RARE (*)    Squamous Epithelial / LPF 0-5 (*)    All other components within normal limits   ____________________________________________   RADIOLOGY  CT scan negative for any acute renal calculi. ____________________________________________   PROCEDURES  Procedure(s) performed: None  Critical Care performed: No  ____________________________________________   INITIAL IMPRESSION / ASSESSMENT AND PLAN / ED COURSE  Pertinent labs & imaging results that were available during my care of the patient were reviewed by me and considered in my medical decision making (see chart for details).  Mild hematuria with UTI symptoms. Rx given for Bactrim DS twice a day, Flexeril 10 mg 3 times a day, ibuprofen 800 mg 3 times a day and Percocet 5/325 as needed for breakthrough pain. Patient to return to the ER with any worsening symptomology. She voices understanding and offers no other emergency medical complaints at this time. ____________________________________________   FINAL CLINICAL IMPRESSION(S) / ED DIAGNOSES  Final diagnoses:  Cystitis  Low back strain, initial encounter     This chart was dictated using voice recognition software/Dragon. Despite best efforts to proofread, errors can occur which can change the meaning. Any change was purely unintentional.   Evangeline Dakin, PA-C 11/20/15  1439  Governor Rooks, MD 11/20/15 [phone]

## 2015-11-20 NOTE — ED Notes (Signed)
Reports lower back pain x 2 wks, states when she moves it catches her breath.  No resp distress, skin w/d with good color.

## 2016-08-29 ENCOUNTER — Emergency Department
Admission: EM | Admit: 2016-08-29 | Discharge: 2016-08-29 | Disposition: A | Discharge status: Home / Self Care | Payer: Medicaid Other | Attending: Emergency Medicine | Admitting: Emergency Medicine

## 2016-08-29 ENCOUNTER — Encounter: Payer: Self-pay | Admitting: Emergency Medicine

## 2016-08-29 ENCOUNTER — Emergency Department: Payer: Medicaid Other

## 2016-08-29 DIAGNOSIS — E119 Type 2 diabetes mellitus without complications: Secondary | ICD-10-CM | POA: Diagnosis not present

## 2016-08-29 DIAGNOSIS — I10 Essential (primary) hypertension: Secondary | ICD-10-CM | POA: Insufficient documentation

## 2016-08-29 DIAGNOSIS — Z79899 Other long term (current) drug therapy: Secondary | ICD-10-CM | POA: Diagnosis not present

## 2016-08-29 DIAGNOSIS — R109 Unspecified abdominal pain: Secondary | ICD-10-CM | POA: Diagnosis present

## 2016-08-29 DIAGNOSIS — Z7984 Long term (current) use of oral hypoglycemic drugs: Secondary | ICD-10-CM | POA: Diagnosis not present

## 2016-08-29 DIAGNOSIS — N12 Tubulo-interstitial nephritis, not specified as acute or chronic: Secondary | ICD-10-CM | POA: Insufficient documentation

## 2016-08-29 LAB — CBC WITH DIFFERENTIAL/PLATELET
BASOS PCT: 1 %
Basophils Absolute: 0 10*3/uL (ref 0–0.1)
EOS PCT: 6 %
Eosinophils Absolute: 0.4 10*3/uL (ref 0–0.7)
HEMATOCRIT: 43.2 % (ref 35.0–47.0)
Hemoglobin: 14.2 g/dL (ref 12.0–16.0)
Lymphocytes Relative: 38 %
Lymphs Abs: 2.5 10*3/uL (ref 1.0–3.6)
MCH: 27.6 pg (ref 26.0–34.0)
MCHC: 32.9 g/dL (ref 32.0–36.0)
MCV: 84 fL (ref 80.0–100.0)
MONO ABS: 0.6 10*3/uL (ref 0.2–0.9)
MONOS PCT: 10 %
NEUTROS ABS: 3 10*3/uL (ref 1.4–6.5)
Neutrophils Relative %: 45 %
PLATELETS: 269 10*3/uL (ref 150–440)
RBC: 5.14 MIL/uL (ref 3.80–5.20)
RDW: 13.6 % (ref 11.5–14.5)
WBC: 6.4 10*3/uL (ref 3.6–11.0)

## 2016-08-29 LAB — BASIC METABOLIC PANEL
Anion gap: 9 (ref 5–15)
BUN: 13 mg/dL (ref 6–20)
CALCIUM: 10 mg/dL (ref 8.9–10.3)
CO2: 26 mmol/L (ref 22–32)
CREATININE: 0.77 mg/dL (ref 0.44–1.00)
Chloride: 103 mmol/L (ref 101–111)
GFR calc non Af Amer: >60 mL/min (ref >60)
Glucose, Bld: 196 mg/dL — ABNORMAL HIGH (ref 65–99)
Potassium: 3.6 mmol/L (ref 3.5–5.1)
Sodium: 138 mmol/L (ref 135–145)

## 2016-08-29 LAB — URINALYSIS, COMPLETE (UACMP) WITH MICROSCOPIC
BILIRUBIN URINE: NEGATIVE
GLUCOSE, UA: NEGATIVE mg/dL
HGB URINE DIPSTICK: NEGATIVE
KETONES UR: NEGATIVE mg/dL
NITRITE: NEGATIVE
PROTEIN: 30 mg/dL — AB
Specific Gravity, Urine: 1.021 (ref 1.005–1.030)
pH: 5 (ref 5.0–8.0)

## 2016-08-29 MED ORDER — SODIUM CHLORIDE 0.9 % IV BOLUS (SEPSIS)
1000.0000 mL | Freq: Once | INTRAVENOUS | Status: AC
  Administered 2016-08-29: 1000 mL via INTRAVENOUS

## 2016-08-29 MED ORDER — CEFTRIAXONE SODIUM-DEXTROSE 1-3.74 GM-% IV SOLR
INTRAVENOUS | Status: AC
  Administered 2016-08-29: 1 g via INTRAVENOUS
  Filled 2016-08-29: qty 50

## 2016-08-29 MED ORDER — KETOROLAC TROMETHAMINE 30 MG/ML IJ SOLN
30.0000 mg | Freq: Once | INTRAMUSCULAR | Status: AC
  Administered 2016-08-29: 30 mg via INTRAVENOUS
  Filled 2016-08-29: qty 1

## 2016-08-29 MED ORDER — CEFTRIAXONE SODIUM-DEXTROSE 1-3.74 GM-% IV SOLR
1.0000 g | Freq: Once | INTRAVENOUS | Status: AC
  Administered 2016-08-29: 1 g via INTRAVENOUS

## 2016-08-29 MED ORDER — SULFAMETHOXAZOLE-TRIMETHOPRIM 800-160 MG PO TABS: 1.0000 | ORAL_TABLET | Freq: Two times a day (BID) | ORAL | 0 refills | Status: AC

## 2016-08-29 MED ORDER — CEFTRIAXONE SODIUM 1 G IJ SOLR: 1.0000 g | Freq: Once | INTRAMUSCULAR | Status: DC

## 2016-08-29 NOTE — ED Provider Notes (Signed)
[facility] Emergency Department Provider Note  ____________________________________________  Time seen: Approximately 4:18 PM  I have reviewed the triage vital signs and the nursing notes.   HISTORY  Chief Complaint Flank Pain   HPI Kelly [name] is a 45 y.o. female a history of diabetes and hypertension who presents for evaluation of dysuria and right flank pain. Patient reports 2 weeks of urinary frequency and mild right flank pain. Today her pain became much more severe, now wanted to 9 out of 10, throbbing pain, located in the right flank, nonradiating. Patient denies any prior history of kidney stones. She reports that the pain is been getting progressively worse over the course of the last 2 weeks together with her urinary symptoms. She denies nausea, vomiting, fever, chills, abdominal pain. Patient is status post cholecystectomy many years ago. She does endorse that her sugars have been higher at home recently. No vaginal discharge. Patient status post hysterectomy.  Past Medical History:  Diagnosis Date  . Diabetes mellitus without complication (HCC)   . Hypertension     There are no active problems to display for this patient.   Past Surgical History:  Procedure Laterality Date  . ABDOMINAL HYSTERECTOMY      Prior to Admission medications   Medication Sig Start Date End Date Taking? Authorizing Provider  cyclobenzaprine (FLEXERIL) 10 MG tablet Take 1 tablet (10 mg total) by mouth every 8 (eight) hours as needed for muscle spasms. 11/20/15   Charmayne Sheerharles M Beers, PA-C  hydrochlorothiazide (HYDRODIURIL) 25 MG tablet Take 25 mg by mouth daily.    Historical Provider, MD  ibuprofen (ADVIL,MOTRIN) 800 MG tablet Take 1 tablet (800 mg total) by mouth every 8 (eight) hours as needed. 11/20/15   Charmayne Sheerharles M Beers, PA-C  lisinopril (PRINIVIL,ZESTRIL) 40 MG tablet Take 40 mg by mouth daily.    Historical Provider, MD  metFORMIN (GLUCOPHAGE) 500 MG tablet Take by  mouth 2 (two) times daily with a meal.    Historical Provider, MD  oxyCODONE-acetaminophen (ROXICET) 5-325 MG tablet Take 1-2 tablets by mouth every 4 (four) hours as needed for severe pain. 11/20/15   Charmayne Sheerharles M Beers, PA-C  sulfamethoxazole-trimethoprim (BACTRIM DS,SEPTRA DS) 800-160 MG tablet Take 1 tablet by mouth 2 (two) times daily. 08/29/16 09/05/16  Nita Sicklearolina Blayke Cordrey, MD    Allergies Patient has no known allergies.  No family history on file.  Social History Social History  Substance Use Topics  . Smoking status: Never Smoker  . Smokeless tobacco: Not on file  . Alcohol use No    Review of Systems  Constitutional: Negative for fever. Eyes: Negative for visual changes. ENT: Negative for sore throat. Neck: No neck pain  Cardiovascular: Negative for chest pain. Respiratory: Negative for shortness of breath. Gastrointestinal: Negative for abdominal pain, vomiting or diarrhea. Genitourinary: + urinary frequency and R flank pain Musculoskeletal: Negative for back pain. Skin: Negative for rash. Neurological: Negative for headaches, weakness or numbness. Psych: No SI or HI  ____________________________________________   PHYSICAL EXAM:  VITAL SIGNS: ED Triage Vitals  Enc Vitals Group     BP 08/29/16 1432 126/86     Pulse Rate 08/29/16 1432 92     Resp 08/29/16 1432 16     Temp 08/29/16 1432 98.5 F (36.9 C)     Temp Source 08/29/16 1432 Oral     SpO2 08/29/16 1432 99 %     Weight 08/29/16 1432 189 lb (85.7 kg)     Height 08/29/16 1432 5'  5" (1.651 m)     Head Circumference --      Peak Flow --      Pain Score 08/29/16 1437 9     Pain Loc --      Pain Edu? --      Excl. in GC? --     Constitutional: Alert and oriented. Well appearing and in no apparent distress. HEENT:      Head: Normocephalic and atraumatic.         Eyes: Conjunctivae are normal. Sclera is non-icteric. EOMI. PERRL      Mouth/Throat: Mucous membranes are moist.       Neck: Supple with no  signs of meningismus. Cardiovascular: Regular rate and rhythm. No murmurs, gallops, or rubs. 2+ symmetrical distal pulses are present in all extremities. No JVD. Respiratory: Normal respiratory effort. Lungs are clear to auscultation bilaterally. No wheezes, crackles, or rhonchi.  Gastrointestinal: Soft, non tender, and non distended with positive bowel sounds. No rebound or guarding. Genitourinary: Mild R CVA tenderness. Musculoskeletal: Nontender with normal range of motion in all extremities. No edema, cyanosis, or erythema of extremities. Neurologic: Normal speech and language. Face is symmetric. Moving all extremities. No gross focal neurologic deficits are appreciated. Skin: Skin is warm, dry and intact. No rash noted. Psychiatric: Mood and affect are normal. Speech and behavior are normal.  ____________________________________________   LABS (all labs ordered are listed, but only abnormal results are displayed)  Labs Reviewed  URINALYSIS, COMPLETE (UACMP) WITH MICROSCOPIC - Abnormal; Notable for the following:       Result Value   Color, Urine YELLOW (*)    APPearance HAZY (*)    Protein, ur 30 (*)    Leukocytes, UA MODERATE (*)    Bacteria, UA MANY (*)    Squamous Epithelial / LPF 6-30 (*)    All other components within normal limits  BASIC METABOLIC PANEL - Abnormal; Notable for the following:    Glucose, Bld 196 (*)    All other components within normal limits  URINE CULTURE  CBC WITH DIFFERENTIAL/PLATELET   ____________________________________________  EKG  none ____________________________________________  RADIOLOGY  CT renal:  1. No acute abdominal/pelvic findings, mass lesions or lymphadenopathy. 2. No renal, ureteral or bladder calculi or mass. 3. Status post cholecystectomy. No biliary dilatation. 4. 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 ____________________________________________   PROCEDURES  Procedure(s) performed: None Procedures Critical Care performed:  None ____________________________________________   INITIAL IMPRESSION / ASSESSMENT AND PLAN / ED COURSE  45 y.o. female a history of diabetes and hypertension who presents for evaluation of dysuria and right flank pain x 2 weeks worse today. UA concerning for urinary tract infection with many bacteria, moderate leuks, and too numerous to count WBCs. We'll check CBC, BMP to rule out DKA. We'll give IV fluids, IV Toradol, IV ceftriaxone. Patient has no blood in her urine, no prior history of kidney stones, and a progressively worsening pain together with the urinary symptoms making kidney stone less likely.  Clinical Course as of Aug 29 2038  Mon Aug 29, 2016  1827 Patient continues to endorse severe pain 7/10 on the R flank therefore CT renal was ordered to rule out kidney stone.  [CV]    Clinical Course User Index [CV] Nita Sicklearolina Rufus Beske, MD    Pertinent labs & imaging results that were available during my care of the patient were reviewed by me and considered in my medical decision making (see chart  for details).    ____________________________________________   FINAL CLINICAL IMPRESSION(S) / ED DIAGNOSES  Final diagnoses:  Pyelonephritis      NEW MEDICATIONS STARTED DURING THIS VISIT:  Discharge Medication List as of 08/29/2016  6:24 PM       Note:  This document was prepared using Dragon voice recognition software and may include unintentional dictation errors.    Nita Sickle, MD 08/29/16 2039

## 2016-08-29 NOTE — ED Triage Notes (Signed)
Patient presents to the ED with flank pain x 1 week and urinary urgency and frequency.  Patient denies dysuria.  Patient reports flank pain is very sharp and will make her feel short of breath occasionally due to it's severity.  Patient ambulatory to triage without difficulty.  No obvious distress at this time.

## 2016-08-31 LAB — URINE CULTURE: Culture: >=100000 — AB

## 2016-09-17 ENCOUNTER — Emergency Department
Admission: EM | Admit: 2016-09-17 | Discharge: 2016-09-18 | Disposition: A | Discharge status: Home / Self Care | Payer: Medicaid Other | Attending: Emergency Medicine | Admitting: Emergency Medicine

## 2016-09-17 DIAGNOSIS — M545 Low back pain, unspecified: Secondary | ICD-10-CM

## 2016-09-17 DIAGNOSIS — Z7984 Long term (current) use of oral hypoglycemic drugs: Secondary | ICD-10-CM | POA: Insufficient documentation

## 2016-09-17 DIAGNOSIS — I10 Essential (primary) hypertension: Secondary | ICD-10-CM | POA: Diagnosis not present

## 2016-09-17 DIAGNOSIS — E119 Type 2 diabetes mellitus without complications: Secondary | ICD-10-CM | POA: Diagnosis not present

## 2016-09-17 DIAGNOSIS — R109 Unspecified abdominal pain: Secondary | ICD-10-CM | POA: Insufficient documentation

## 2016-09-17 LAB — URINALYSIS, COMPLETE (UACMP) WITH MICROSCOPIC
BACTERIA UA: NONE SEEN
Bilirubin Urine: NEGATIVE
Glucose, UA: >=500 mg/dL — AB
HGB URINE DIPSTICK: NEGATIVE
Ketones, ur: NEGATIVE mg/dL
NITRITE: NEGATIVE
PH: 5 (ref 5.0–8.0)
Protein, ur: 100 mg/dL — AB
SPECIFIC GRAVITY, URINE: 1.02 (ref 1.005–1.030)

## 2016-09-17 LAB — BASIC METABOLIC PANEL
Anion gap: 10 (ref 5–15)
BUN: 10 mg/dL (ref 6–20)
CHLORIDE: 102 mmol/L (ref 101–111)
CO2: 27 mmol/L (ref 22–32)
Calcium: 9.7 mg/dL (ref 8.9–10.3)
Creatinine, Ser: 0.79 mg/dL (ref 0.44–1.00)
GFR calc Af Amer: >60 mL/min (ref >60)
GFR calc non Af Amer: >60 mL/min (ref >60)
Glucose, Bld: 303 mg/dL — ABNORMAL HIGH (ref 65–99)
POTASSIUM: 3.2 mmol/L — AB (ref 3.5–5.1)
SODIUM: 139 mmol/L (ref 135–145)

## 2016-09-17 LAB — CBC
HEMATOCRIT: 40 % (ref 35.0–47.0)
HEMOGLOBIN: 13.3 g/dL (ref 12.0–16.0)
MCH: 27.9 pg (ref 26.0–34.0)
MCHC: 33.3 g/dL (ref 32.0–36.0)
MCV: 83.8 fL (ref 80.0–100.0)
Platelets: 228 10*3/uL (ref 150–440)
RBC: 4.77 MIL/uL (ref 3.80–5.20)
RDW: 13.7 % (ref 11.5–14.5)
WBC: 6.1 10*3/uL (ref 3.6–11.0)

## 2016-09-17 MED ORDER — KETOROLAC TROMETHAMINE 60 MG/2ML IM SOLN
60.0000 mg | Freq: Once | INTRAMUSCULAR | Status: AC
  Administered 2016-09-18: 60 mg via INTRAMUSCULAR
  Filled 2016-09-17: qty 2

## 2016-09-17 MED ORDER — LIDOCAINE 5 % EX PTCH
1.0000 | MEDICATED_PATCH | CUTANEOUS | Status: DC
  Administered 2016-09-18: 1 via TRANSDERMAL
  Filled 2016-09-17: qty 1

## 2016-09-17 NOTE — ED Triage Notes (Signed)
Pt states that she was seen and treated a couple weeks ago for same, pt states that she was told she had a kidney infection, pt reports completing her antibiotics and states that the pain is worse.

## 2016-09-17 NOTE — ED Notes (Signed)
MD at bedside. 

## 2016-09-18 ENCOUNTER — Emergency Department: Payer: Medicaid Other

## 2016-09-18 MED ORDER — DIAZEPAM 2 MG PO TABS: 2.0000 mg | ORAL_TABLET | Freq: Four times a day (QID) | ORAL | 0 refills | Status: DC | PRN

## 2016-09-18 MED ORDER — TRAMADOL HCL 50 MG PO TABS: 50.0000 mg | ORAL_TABLET | Freq: Four times a day (QID) | ORAL | 0 refills | Status: DC | PRN

## 2016-09-18 MED ORDER — TRAMADOL HCL 50 MG PO TABS
50.0000 mg | ORAL_TABLET | Freq: Once | ORAL | Status: AC
  Administered 2016-09-18: 50 mg via ORAL
  Filled 2016-09-18: qty 1

## 2016-09-18 MED ORDER — LIDOCAINE 5 % EX PTCH: 1.0000 | MEDICATED_PATCH | Freq: Two times a day (BID) | CUTANEOUS | 0 refills | Status: DC

## 2016-09-18 NOTE — ED Provider Notes (Signed)
[facility] Emergency Department Provider Note   ____________________________________________   First MD Initiated Contact with Patient 09/17/16 1142     (approximate)  I have reviewed the triage vital signs and the nursing notes.   HISTORY  Chief Complaint Flank Pain    HPI Kelly [name] is a [age] y.o. female who comes into the hospital today with some right-sided lower back pain. The patient reports that this started about 2 weeks ago and has been getting worse. She was here 2 months ago with similar symptoms and was told it was a kidney infection. The patient reports that last night she couldn't get out of the tub because of the pain. She had a CT last time that told her that it was an infection. She reports that she didn't do anything to hurt her back it started on his own. The patient rates her pain a 9 out of 10 in intensity. She reports that it feels like cramps and contractions in her back. It doesn't hurt to urinate but she did have some dark looking urine. She's had normal bowel movements with no blood in her urine. The patient has no nausea no vomiting and some occasional shortness of breath. She is here today for evaluation.   Past Medical History:  Diagnosis Date  . Diabetes mellitus without complication (HCC)   . Hypertension     There are no active problems to display for this patient.   Past Surgical History:  Procedure Laterality Date  . ABDOMINAL HYSTERECTOMY      Prior to Admission medications   Medication Sig Start Date End Date Taking? Authorizing Provider  cyclobenzaprine (FLEXERIL) 10 MG tablet Take 1 tablet (10 mg total) by mouth every 8 (eight) hours as needed for muscle spasms. 11/20/15   Charmayne Sheerharles M Beers, PA-C  diazepam (VALIUM) 2 MG tablet Take 1 tablet (2 mg total) by mouth every 6 (six) hours as needed for anxiety. 09/18/16   Rebecka ApleyAllison P Yanina Knupp, MD  hydrochlorothiazide (HYDRODIURIL) 25 MG tablet Take 25 mg by mouth daily.     Historical Provider, MD  ibuprofen (ADVIL,MOTRIN) 800 MG tablet Take 1 tablet (800 mg total) by mouth every 8 (eight) hours as needed. 11/20/15   Charmayne Sheerharles M Beers, PA-C  lidocaine (LIDODERM) 5 % Place 1 patch onto the skin every 12 (twelve) hours. Remove & Discard patch within 12 hours or as directed by MD 09/18/16 09/18/17  Rebecka ApleyAllison P Yael Coppess, MD  lisinopril (PRINIVIL,ZESTRIL) 40 MG tablet Take 40 mg by mouth daily.    Historical Provider, MD  metFORMIN (GLUCOPHAGE) 500 MG tablet Take by mouth 2 (two) times daily with a meal.    Historical Provider, MD  oxyCODONE-acetaminophen (ROXICET) 5-325 MG tablet Take 1-2 tablets by mouth every 4 (four) hours as needed for severe pain. 11/20/15   Charmayne Sheerharles M Beers, PA-C  traMADol (ULTRAM) 50 MG tablet Take 1 tablet (50 mg total) by mouth every 6 (six) hours as needed. 09/18/16   Rebecka ApleyAllison P Laticia Vannostrand, MD    Allergies Patient has no known allergies.  No family history on file.  Social History Social History  Substance Use Topics  . Smoking status: Never Smoker  . Smokeless tobacco: Not on file  . Alcohol use No    Review of Systems Constitutional: No fever/chills Eyes: No visual changes. ENT: No sore throat. Cardiovascular: Denies chest pain. Respiratory: Denies shortness of breath. Gastrointestinal: No abdominal pain.  No nausea, no vomiting.  No diarrhea.  No constipation. Genitourinary:  Negative for dysuria. Musculoskeletal:  back pain. Skin: Negative for rash. Neurological: Negative for headaches, focal weakness or numbness.  10-point ROS otherwise negative.  ____________________________________________   PHYSICAL EXAM:  VITAL SIGNS: ED Triage Vitals  Enc Vitals Group     BP 09/17/16 1621 (!) 151/94     Pulse Rate 09/17/16 1621 94     Resp 09/17/16 1621 18     Temp 09/17/16 1621 97.9 F (36.6 C)     Temp Source 09/17/16 1621 Oral     SpO2 09/17/16 1621 98 %     Weight 09/17/16 1622 189 lb (85.7 kg)     Height 09/17/16 1622 5\' 5"   (1.651 m)     Head Circumference --      Peak Flow --      Pain Score 09/17/16 1625 9     Pain Loc --      Pain Edu? --      Excl. in GC? --     Constitutional: Alert and oriented. Well appearing and in no acute distress. Eyes: Conjunctivae are normal. PERRL. EOMI. Head: Atraumatic. Nose: No congestion/rhinnorhea. Mouth/Throat: Mucous membranes are moist.  Oropharynx non-erythematous. Cardiovascular: Normal rate, regular rhythm. Grossly normal heart sounds.  Good peripheral circulation. Respiratory: Normal respiratory effort.  No retractions. Lungs CTAB. Gastrointestinal: Soft and nontender. No distention. Positive bowel sounds Musculoskeletal: Tenderness to palpation of right back down into her sciatic region with negative straight leg raise.   Neurologic:  Normal speech and language.  Skin:  Skin is warm, dry and intact.  Psychiatric: Mood and affect are normal.   ____________________________________________   LABS (all labs ordered are listed, but only abnormal results are displayed)  Labs Reviewed  URINALYSIS, COMPLETE (UACMP) WITH MICROSCOPIC - Abnormal; Notable for the following:       Result Value   Color, Urine YELLOW (*)    APPearance CLOUDY (*)    Glucose, UA >=500 (*)    Protein, ur 100 (*)    Leukocytes, UA LARGE (*)    Squamous Epithelial / LPF TOO NUMEROUS TO COUNT (*)    All other components within normal limits  BASIC METABOLIC PANEL - Abnormal; Notable for the following:    Potassium 3.2 (*)    Glucose, Bld 303 (*)    All other components within normal limits  CBC   ____________________________________________  EKG  none ____________________________________________  RADIOLOGY  US renal ____________________________________________   PROCEDURES  Procedure(s) performed: None  Procedures  Critical Care performed: No  ____________________________________________   INITIAL IMPRESSION / ASSESSMENT AND PLAN / ED COURSE  Pertinent labs &  imaging results that were available during my care of the patient were reviewed by me and considered in my medical decision making (see chart for details).  This is a 45 year old female who comes into the hospital today with some right-sided back pain. I did send the patient for an ultrasound looking for possible hydronephrosis or kidney stone but that was negative. I feel that this patient's pain is musculoskeletal. I did give the patient a shot of Toradol and a Lidoderm patch. I will also give the patient some tramadol. The patient has an appointment with her doctor on January 19. I did tell the patient that she should follow-up for further evaluation. She should stay hydrated and take the medicine at home. The patient otherwise has no further complaints she'll be discharged home.  Clinical Course as of Sep 18 199  Sun Sep 18, 2016  0116 Normal renal ultrasound  US Renal [AW]    Clinical Course User Index [AW] Rebecka Apley, MD     ____________________________________________   FINAL CLINICAL IMPRESSION(S) / ED DIAGNOSES  Final diagnoses:  Right flank pain  Flank pain  Acute right-sided low back pain without sciatica      NEW MEDICATIONS STARTED DURING THIS VISIT:  Discharge Medication List as of 09/18/2016  1:38 AM    START taking these medications   Details  diazepam (VALIUM) 2 MG tablet Take 1 tablet (2 mg total) by mouth every 6 (six) hours as needed for anxiety., Starting Sun 09/18/2016, Print    lidocaine (LIDODERM) 5 % Place 1 patch onto the skin every 12 (twelve) hours. Remove & Discard patch within 12 hours or as directed by MD, Starting Sun 09/18/2016, Until Mon 09/18/2017, Print    traMADol (ULTRAM) 50 MG tablet Take 1 tablet (50 mg total) by mouth every 6 (six) hours as needed., Starting Sun 09/18/2016, Print         Note:  This document was prepared using Dragon voice recognition software and may include unintentional dictation errors.    Rebecka Apley, MD 09/18/16 0200

## 2016-09-18 NOTE — Discharge Instructions (Signed)
Please take your medicine and follow up with your primary care physician.

## 2016-10-25 ENCOUNTER — Emergency Department
Admission: EM | Admit: 2016-10-25 | Discharge: 2016-10-25 | Disposition: A | Discharge status: Home / Self Care | Payer: Medicaid Other | Attending: Emergency Medicine | Admitting: Emergency Medicine

## 2016-10-25 ENCOUNTER — Encounter: Payer: Self-pay | Admitting: Emergency Medicine

## 2016-10-25 DIAGNOSIS — S60862A Insect bite (nonvenomous) of left wrist, initial encounter: Secondary | ICD-10-CM | POA: Diagnosis present

## 2016-10-25 DIAGNOSIS — W57XXXA Bitten or stung by nonvenomous insect and other nonvenomous arthropods, initial encounter: Secondary | ICD-10-CM | POA: Diagnosis not present

## 2016-10-25 DIAGNOSIS — Z79899 Other long term (current) drug therapy: Secondary | ICD-10-CM | POA: Insufficient documentation

## 2016-10-25 DIAGNOSIS — Z794 Long term (current) use of insulin: Secondary | ICD-10-CM | POA: Diagnosis not present

## 2016-10-25 DIAGNOSIS — Y939 Activity, unspecified: Secondary | ICD-10-CM | POA: Insufficient documentation

## 2016-10-25 DIAGNOSIS — Y929 Unspecified place or not applicable: Secondary | ICD-10-CM | POA: Diagnosis not present

## 2016-10-25 DIAGNOSIS — E119 Type 2 diabetes mellitus without complications: Secondary | ICD-10-CM | POA: Diagnosis not present

## 2016-10-25 DIAGNOSIS — I10 Essential (primary) hypertension: Secondary | ICD-10-CM | POA: Diagnosis not present

## 2016-10-25 DIAGNOSIS — L089 Local infection of the skin and subcutaneous tissue, unspecified: Secondary | ICD-10-CM | POA: Diagnosis not present

## 2016-10-25 DIAGNOSIS — Y999 Unspecified external cause status: Secondary | ICD-10-CM | POA: Diagnosis not present

## 2016-10-25 MED ORDER — IBUPROFEN 600 MG PO TABS: 600.0000 mg | ORAL_TABLET | Freq: Three times a day (TID) | ORAL | 0 refills | Status: DC | PRN

## 2016-10-25 MED ORDER — SULFAMETHOXAZOLE-TRIMETHOPRIM 800-160 MG PO TABS: 1.0000 | ORAL_TABLET | Freq: Two times a day (BID) | ORAL | 0 refills | Status: DC

## 2016-10-25 MED ORDER — TRAMADOL HCL 50 MG PO TABS: 50.0000 mg | ORAL_TABLET | Freq: Four times a day (QID) | ORAL | 0 refills | Status: DC | PRN

## 2016-10-25 NOTE — ED Triage Notes (Signed)
Possible insect bite to left wrist  Area red and swollen

## 2016-10-25 NOTE — ED Provider Notes (Signed)
[facility] Emergency Department Provider Note   ____________________________________________   First MD Initiated Contact with Patient 10/25/16 1733     (approximate)  I have reviewed the triage vital signs and the nursing notes.   HISTORY  Chief Complaint Insect Bite    HPI Kelly [name] is a 46 y.o. female patient complaining of redness, pain, and swellingto left wrist for 2 days. Patient states she felt insect bite but did not see what bit her. He stated pain is increase in the last 6 hours. Patient rates the pain as 8/10. Patient described a pain as "tightness and sharp and equal. No palliative measures for this complaint.   Past Medical History:  Diagnosis Date  . Diabetes mellitus without complication (HCC)   . Hypertension     There are no active problems to display for this patient.   Past Surgical History:  Procedure Laterality Date  . ABDOMINAL HYSTERECTOMY      Prior to Admission medications   Medication Sig Start Date End Date Taking? Authorizing Provider  insulin detemir (LEVEMIR) 100 UNIT/ML injection Inject into the skin at bedtime.   Yes Historical Provider, MD  cyclobenzaprine (FLEXERIL) 10 MG tablet Take 1 tablet (10 mg total) by mouth every 8 (eight) hours as needed for muscle spasms. 11/20/15   Charmayne Sheer Beers, PA-C  diazepam (VALIUM) 2 MG tablet Take 1 tablet (2 mg total) by mouth every 6 (six) hours as needed for anxiety. 09/18/16   Rebecka Apley, MD  hydrochlorothiazide (HYDRODIURIL) 25 MG tablet Take 25 mg by mouth daily.    Historical Provider, MD  ibuprofen (ADVIL,MOTRIN) 600 MG tablet Take 1 tablet (600 mg total) by mouth every 8 (eight) hours as needed. 10/25/16   Joni Reining, PA-C  ibuprofen (ADVIL,MOTRIN) 800 MG tablet Take 1 tablet (800 mg total) by mouth every 8 (eight) hours as needed. 11/20/15   Charmayne Sheer Beers, PA-C  lidocaine (LIDODERM) 5 % Place 1 patch onto the skin every 12 (twelve) hours. Remove &  Discard patch within 12 hours or as directed by MD 09/18/16 09/18/17  Rebecka Apley, MD  lisinopril (PRINIVIL,ZESTRIL) 40 MG tablet Take 40 mg by mouth daily.    Historical Provider, MD  metFORMIN (GLUCOPHAGE) 500 MG tablet Take by mouth 2 (two) times daily with a meal.    Historical Provider, MD  oxyCODONE-acetaminophen (ROXICET) 5-325 MG tablet Take 1-2 tablets by mouth every 4 (four) hours as needed for severe pain. 11/20/15   Charmayne Sheer Beers, PA-C  sulfamethoxazole-trimethoprim (BACTRIM DS,SEPTRA DS) 800-160 MG tablet Take 1 tablet by mouth 2 (two) times daily. 10/25/16   Joni Reining, PA-C  traMADol (ULTRAM) 50 MG tablet Take 1 tablet (50 mg total) by mouth every 6 (six) hours as needed. 09/18/16   Rebecka Apley, MD  traMADol (ULTRAM) 50 MG tablet Take 1 tablet (50 mg total) by mouth every 6 (six) hours as needed for moderate pain. 10/25/16   Joni Reining, PA-C    Allergies Patient has no known allergies.  No family history on file.  Social History Social History  Substance Use Topics  . Smoking status: Never Smoker  . Smokeless tobacco: Never Used  . Alcohol use No    Review of Systems Constitutional: No fever/chills Eyes: No visual changes. ENT: No sore throat. Cardiovascular: Denies chest pain. Respiratory: Denies shortness of breath. Gastrointestinal: No abdominal pain.  No nausea, no vomiting.  No diarrhea.  No constipation. Genitourinary: Negative for  dysuria. Musculoskeletal: Negative for back pain. Skin: Negative for rash.Swelling and redness left wrist Neurological: Negative for headaches, focal weakness or numbness. 10-point ROS otherwise negative.  ____________________________________________   PHYSICAL EXAM:  VITAL SIGNS: ED Triage Vitals  Enc Vitals Group     BP 10/25/16 1714 116/83     Pulse Rate 10/25/16 1714 (!) 107     Resp 10/25/16 1714 20     Temp 10/25/16 1714 98 F (36.7 C)     Temp Source 10/25/16 1714 Oral     SpO2 10/25/16 1714 100 %      Weight 10/25/16 1713 180 lb (81.6 kg)     Height 10/25/16 1713 5\' 4"  (1.626 m)     Head Circumference --      Peak Flow --      Pain Score 10/25/16 1714 8     Pain Loc --      Pain Edu? --      Excl. in GC? --     Constitutional: Alert and oriented. Well appearing and in no acute distress. Eyes: Conjunctivae are normal. PERRL. EOMI. Head: Atraumatic. Nose: No congestion/rhinnorhea. Mouth/Throat: Mucous membranes are moist.  Oropharynx non-erythematous. Neck: No stridor.  No cervical spine tenderness to palpation. Hematological/Lymphatic/Immunilogical: No cervical lymphadenopathy. Cardiovascular: Normal rate, regular rhythm. Grossly normal heart sounds.  Good peripheral circulation. Respiratory: Normal respiratory effort.  No retractions. Lungs CTAB. Gastrointestinal: Soft and nontender. No distention. No abdominal bruits. No CVA tenderness. Musculoskeletal: No lower extremity tenderness nor edema.  No joint effusions. Neurologic:  Normal speech and language. No gross focal neurologic deficits are appreciated. No gait instability. Skin:  Skin is warm, dry and intact. No rash noted.Edema and erythema radial aspect the right wrist. Psychiatric: Mood and affect are normal. Speech and behavior are normal.  ____________________________________________   LABS (all labs ordered are listed, but only abnormal results are displayed)  Labs Reviewed - No data to display ____________________________________________  EKG   ____________________________________________  RADIOLOGY   ____________________________________________   PROCEDURES  Procedure(s) performed: None  Procedures  Critical Care performed: No  ____________________________________________   INITIAL IMPRESSION / ASSESSMENT AND PLAN / ED COURSE  Pertinent labs & imaging results that were available during my care of the patient were reviewed by me and considered in my medical decision making (see chart for  details).  Infected insect bite. Patient given discharge Instructions. Patient given prescription for Bactrim DS, ibuprofen, tramadol. Patient advised follow-up family doctor if no improvement in 3-5 days.      ____________________________________________   FINAL CLINICAL IMPRESSION(S) / ED DIAGNOSES  Final diagnoses:  Bug bite with infection, initial encounter      NEW MEDICATIONS STARTED DURING THIS VISIT:  New Prescriptions   IBUPROFEN (ADVIL,MOTRIN) 600 MG TABLET    Take 1 tablet (600 mg total) by mouth every 8 (eight) hours as needed.   SULFAMETHOXAZOLE-TRIMETHOPRIM (BACTRIM DS,SEPTRA DS) 800-160 MG TABLET    Take 1 tablet by mouth 2 (two) times daily.   TRAMADOL (ULTRAM) 50 MG TABLET    Take 1 tablet (50 mg total) by mouth every 6 (six) hours as needed for moderate pain.     Note:  This document was prepared using Dragon voice recognition software and may include unintentional dictation errors.    Joni ReiningRonald K Jezebelle Ledwell, PA-C 10/25/16 1741    Myrna Blazeravid Matthew Schaevitz, MD 10/25/16 [phone]

## 2016-11-02 ENCOUNTER — Encounter: Payer: Self-pay | Admitting: *Deleted

## 2016-11-02 ENCOUNTER — Emergency Department: Payer: Medicaid Other

## 2016-11-02 ENCOUNTER — Emergency Department
Admission: EM | Admit: 2016-11-02 | Discharge: 2016-11-02 | Disposition: A | Discharge status: Home / Self Care | Payer: Medicaid Other | Attending: Emergency Medicine | Admitting: Emergency Medicine

## 2016-11-02 DIAGNOSIS — E119 Type 2 diabetes mellitus without complications: Secondary | ICD-10-CM | POA: Diagnosis not present

## 2016-11-02 DIAGNOSIS — Z79899 Other long term (current) drug therapy: Secondary | ICD-10-CM | POA: Diagnosis not present

## 2016-11-02 DIAGNOSIS — I10 Essential (primary) hypertension: Secondary | ICD-10-CM | POA: Diagnosis not present

## 2016-11-02 DIAGNOSIS — R05 Cough: Secondary | ICD-10-CM | POA: Diagnosis present

## 2016-11-02 DIAGNOSIS — Z794 Long term (current) use of insulin: Secondary | ICD-10-CM | POA: Diagnosis not present

## 2016-11-02 DIAGNOSIS — J111 Influenza due to unidentified influenza virus with other respiratory manifestations: Secondary | ICD-10-CM | POA: Diagnosis not present

## 2016-11-02 MED ORDER — TRIAMCINOLONE ACETONIDE 0.025 % EX OINT: 1.0000 "application " | TOPICAL_OINTMENT | Freq: Two times a day (BID) | CUTANEOUS | 0 refills | Status: DC

## 2016-11-02 MED ORDER — RANITIDINE HCL 300 MG PO TABS: 150.0000 mg | ORAL_TABLET | Freq: Every day | ORAL | 1 refills | Status: DC

## 2016-11-02 NOTE — ED Notes (Signed)
See triage note  States she developed cough congestion and body aches with fever about 3 days ago  Afebrile on arrival

## 2016-11-02 NOTE — ED Triage Notes (Signed)
States flu like symptoms for 4 days, awake and alert in no acute distress

## 2016-11-02 NOTE — ED Provider Notes (Signed)
[facility] Emergency Department Provider Note  ____________________________________________  Time seen: Approximately 4:12 PM  I have reviewed the triage vital signs and the nursing notes.   HISTORY  Chief Complaint Cough and Nasal Congestion    HPI Kelly [name] is a 46 y.o. female with a history of diabetes and hypertension presents to the emergency department with congestion, rhinorrhea, nonproductive cough, diarrhea, myalgias, and fatigue for the past 6 days. Patient states that her son has had similar symptoms. She has not taken her temperature. However, she has had chills/sweats. Patient is staying hydrated. She has a normal appetite. Patient denies chest pain, chest tightness, shortness of breath, abdominal pain, nausea and vomiting. She has taken Tylenol. No other alleviating measures have been attempted.       Past Medical History:  Diagnosis Date  . Diabetes mellitus without complication (HCC)   . Hypertension     There are no active problems to display for this patient.   Past Surgical History:  Procedure Laterality Date  . ABDOMINAL HYSTERECTOMY      Prior to Admission medications   Medication Sig Start Date End Date Taking? Authorizing Provider  cyclobenzaprine (FLEXERIL) 10 MG tablet Take 1 tablet (10 mg total) by mouth every 8 (eight) hours as needed for muscle spasms. 11/20/15   Charmayne Sheer Beers, PA-C  diazepam (VALIUM) 2 MG tablet Take 1 tablet (2 mg total) by mouth every 6 (six) hours as needed for anxiety. 09/18/16   Rebecka Apley, MD  hydrochlorothiazide (HYDRODIURIL) 25 MG tablet Take 25 mg by mouth daily.    Historical Provider, MD  ibuprofen (ADVIL,MOTRIN) 600 MG tablet Take 1 tablet (600 mg total) by mouth every 8 (eight) hours as needed. 10/25/16   Joni Reining, PA-C  ibuprofen (ADVIL,MOTRIN) 800 MG tablet Take 1 tablet (800 mg total) by mouth every 8 (eight) hours as needed. 11/20/15   Charmayne Sheer Beers, PA-C  insulin detemir  (LEVEMIR) 100 UNIT/ML injection Inject into the skin at bedtime.    Historical Provider, MD  lidocaine (LIDODERM) 5 % Place 1 patch onto the skin every 12 (twelve) hours. Remove & Discard patch within 12 hours or as directed by MD 09/18/16 09/18/17  Rebecka Apley, MD  lisinopril (PRINIVIL,ZESTRIL) 40 MG tablet Take 40 mg by mouth daily.    Historical Provider, MD  metFORMIN (GLUCOPHAGE) 500 MG tablet Take by mouth 2 (two) times daily with a meal.    Historical Provider, MD  oxyCODONE-acetaminophen (ROXICET) 5-325 MG tablet Take 1-2 tablets by mouth every 4 (four) hours as needed for severe pain. 11/20/15   Charmayne Sheer Beers, PA-C  sulfamethoxazole-trimethoprim (BACTRIM DS,SEPTRA DS) 800-160 MG tablet Take 1 tablet by mouth 2 (two) times daily. 10/25/16   Joni Reining, PA-C  traMADol (ULTRAM) 50 MG tablet Take 1 tablet (50 mg total) by mouth every 6 (six) hours as needed. 09/18/16   Rebecka Apley, MD  traMADol (ULTRAM) 50 MG tablet Take 1 tablet (50 mg total) by mouth every 6 (six) hours as needed for moderate pain. 10/25/16   Joni Reining, PA-C    Allergies Patient has no known allergies.  History reviewed. No pertinent family history.  Social History Social History  Substance Use Topics  . Smoking status: Never Smoker  . Smokeless tobacco: Never Used  . Alcohol use No     Review of Systems  Constitutional: Patient has had chills/sweats. Eyes: No visual changes. No discharge ENT: Patient has had congestion.  Cardiovascular: no chest pain. Respiratory: Patient has had non-productive cough. No SOB. Gastrointestinal: Patient has had nausea.  Genitourinary: Negative for dysuria. No hematuria Musculoskeletal: Patient has had myalgias. Skin: Negative for rash, abrasions, lacerations, ecchymosis. Neurological: Patient has had headache, no focal weakness or numbness. ____________________________________________   PHYSICAL EXAM:  VITAL SIGNS: ED Triage Vitals [11/02/16 1317]  Enc  Vitals Group     BP 114/77     Pulse Rate (!) 102     Resp 18     Temp 98 F (36.7 C)     Temp Source Oral     SpO2 95 %     Weight 180 lb (81.6 kg)     Height 5\' 4"  (1.626 m)     Head Circumference      Peak Flow      Pain Score 8     Pain Loc      Pain Edu?      Excl. in GC?     Constitutional: Alert and oriented. Patient is lying supine in bed.  Eyes: Conjunctivae are normal. PERRL. EOMI. Head: Atraumatic. ENT:      Ears: Tympanic membranes are injected bilaterally without evidence of effusion or purulent exudate. Bony landmarks are visualized bilaterally. No pain with palpation at the tragus.      Nose: Nasal turbinates are edematous and erythematous. Trace rhinorrhea visualized.      Mouth/Throat: Mucous membranes are moist. Posterior pharynx is mildly erythematous. No tonsillar hypertrophy or purulent exudate. Uvula is midline. Neck: Full range of motion. No pain is elicited with flexion at the neck. Hematological/Lymphatic/Immunilogical: No cervical lymphadenopathy. Cardiovascular: Mildly tachycardic, regular rhythm. Normal S1 and S2.  Good peripheral circulation. Respiratory: Normal respiratory effort without tachypnea or retractions. Lungs CTAB. Good air entry to the bases with no decreased or absent breath sounds. Gastrointestinal: Bowel sounds 4 quadrants. Soft and nontender to palpation. No guarding or rigidity. No palpable masses. No distention. No CVA tenderness.  Skin:  Skin is warm, dry and intact. No rash noted. Psychiatric: Mood and affect are normal. Speech and behavior are normal. Patient exhibits appropriate insight and judgement. ____________________________________________   LABS (all labs ordered are listed, but only abnormal results are displayed)  Labs Reviewed - No data to display ____________________________________________  EKG   ____________________________________________  RADIOLOGY  Geraldo PitterI, Anu Stagner M Shelley Cocke, personally viewed and evaluated  these images (plain radiographs) as part of my medical decision making, as well as reviewing the written report by the radiologist.   Dg Chest 2 View  Result Date: 11/02/2016 CLINICAL DATA:  Acute onset of cough.  Initial encounter. EXAM: CHEST  2 VIEW COMPARISON:  Chest radiograph performed 06/12/2013 FINDINGS: The lungs are well-aerated and clear. There is no evidence of focal opacification, pleural effusion or pneumothorax. The heart is normal in size; the mediastinal contour is within normal limits. No acute osseous abnormalities are seen. Clips are noted within the right upper quadrant, reflecting prior cholecystectomy. IMPRESSION: No acute cardiopulmonary process seen. Electronically Signed   By: Roanna RaiderJeffery  Chang M.D.   On: 11/02/2016 16:34    ____________________________________________    PROCEDURES  Procedure(s) performed:    Procedures    Medications - No data to display   ____________________________________________   INITIAL IMPRESSION / ASSESSMENT AND PLAN / ED COURSE  Pertinent labs & imaging results that were available during my care of the patient were reviewed by me and considered in my medical decision making (see chart for details).  Review of the [place] CSRS  was performed in accordance of the [facility] prior to dispensing any controlled drugs.     Assessment and Plan:  Influenza:  Patient presents to the emergency department with congestion, rhinorrhea, nonproductive cough, shortness of breath, diarrhea, myalgias, and fatigue for the past 6 days. Symptoms are consistent with Influenza. DG chest revealed no findings consistent with pneumonia. Tamiflu was not prescribed at discharge as it is not therapeutically efficacious given duration of patient's symptoms. Rest and hydration were encouraged. Patient was advised to follow-up with his primary care provider in one week. Physical exam and vital signs are reassuring at this time. All patient questions were  answered. ____________________________________________  FINAL CLINICAL IMPRESSION(S) / ED DIAGNOSES  Final diagnoses:  Influenza      NEW MEDICATIONS STARTED DURING THIS VISIT:  Discharge Medication List as of 11/02/2016  5:05 PM          This chart was dictated using voice recognition software/Dragon. Despite best efforts to proofread, errors can occur which can change the meaning. Any change was purely unintentional.    Orvil Feil, PA-C 11/02/16 1726    Governor Rooks, MD 11/02/16 2050

## 2016-11-08 ENCOUNTER — Emergency Department: Payer: Medicaid Other

## 2016-11-08 ENCOUNTER — Emergency Department
Admission: EM | Admit: 2016-11-08 | Discharge: 2016-11-08 | Disposition: A | Discharge status: Home / Self Care | Payer: Medicaid Other | Attending: Emergency Medicine | Admitting: Emergency Medicine

## 2016-11-08 ENCOUNTER — Encounter: Payer: Self-pay | Admitting: Emergency Medicine

## 2016-11-08 DIAGNOSIS — R0789 Other chest pain: Secondary | ICD-10-CM | POA: Insufficient documentation

## 2016-11-08 DIAGNOSIS — I10 Essential (primary) hypertension: Secondary | ICD-10-CM | POA: Diagnosis not present

## 2016-11-08 DIAGNOSIS — Z7984 Long term (current) use of oral hypoglycemic drugs: Secondary | ICD-10-CM | POA: Diagnosis not present

## 2016-11-08 DIAGNOSIS — E119 Type 2 diabetes mellitus without complications: Secondary | ICD-10-CM | POA: Insufficient documentation

## 2016-11-08 DIAGNOSIS — Z791 Long term (current) use of non-steroidal anti-inflammatories (NSAID): Secondary | ICD-10-CM | POA: Insufficient documentation

## 2016-11-08 DIAGNOSIS — R079 Chest pain, unspecified: Secondary | ICD-10-CM

## 2016-11-08 LAB — CBC
HCT: 38 % (ref 35.0–47.0)
Hemoglobin: 12.8 g/dL (ref 12.0–16.0)
MCH: 28.2 pg (ref 26.0–34.0)
MCHC: 33.7 g/dL (ref 32.0–36.0)
MCV: 83.8 fL (ref 80.0–100.0)
PLATELETS: 233 10*3/uL (ref 150–440)
RBC: 4.53 MIL/uL (ref 3.80–5.20)
RDW: 13.3 % (ref 11.5–14.5)
WBC: 6.1 10*3/uL (ref 3.6–11.0)

## 2016-11-08 LAB — BASIC METABOLIC PANEL
Anion gap: 7 (ref 5–15)
BUN: 15 mg/dL (ref 6–20)
CALCIUM: 9.7 mg/dL (ref 8.9–10.3)
CHLORIDE: 100 mmol/L — AB (ref 101–111)
CO2: 29 mmol/L (ref 22–32)
CREATININE: 0.98 mg/dL (ref 0.44–1.00)
GFR calc Af Amer: >60 mL/min (ref >60)
GFR calc non Af Amer: >60 mL/min (ref >60)
Glucose, Bld: 294 mg/dL — ABNORMAL HIGH (ref 65–99)
Potassium: 4.2 mmol/L (ref 3.5–5.1)
Sodium: 136 mmol/L (ref 135–145)

## 2016-11-08 LAB — TROPONIN I

## 2016-11-08 MED ORDER — GUAIFENESIN-CODEINE 100-10 MG/5ML PO SOLN: 5.0000 mL | Freq: Four times a day (QID) | ORAL | 0 refills | Status: DC | PRN

## 2016-11-08 NOTE — Discharge Instructions (Signed)
You have been seen in the emergency department today for chest pain. Your workup has shown normal results. As we discussed please follow-up with your primary care physician in the next 1-2 days for recheck. Return to the emergency department for any further chest pain, trouble breathing, or any other symptom personally concerning to yourself. °

## 2016-11-08 NOTE — ED Triage Notes (Signed)
Patient to ER for c/o L sided chest pain that feels like a sharp pressure. Patient states she also feels like her left hand is tingling. Patient was recently seen for flu like symptoms. Patient denies any current flu like symptoms or fever.

## 2016-11-08 NOTE — ED Provider Notes (Signed)
[facility] Emergency Department Provider Note  Time seen: 8:32 PM  I have reviewed the triage vital signs and the nursing notes.   HISTORY  Chief Complaint Chest Pain    HPI Kelly [name] is a 46 y.o. female with a past medical history of diabetes, hypertension, presents to the emergency department with left-sided chest discomfort and some radiation into the left arm. According to the patient last week she was sick with a flulike illness coughing with congestion. She states continued mild cough. This is morning she has been experiencing some left-sided chest discomfort worse with coughing, with minimal radiation down the left arm. Denies diaphoresis or nausea. Patient states mild discomfort currently.  Past Medical History:  Diagnosis Date  . Diabetes mellitus without complication (HCC)   . Hypertension     There are no active problems to display for this patient.   Past Surgical History:  Procedure Laterality Date  . ABDOMINAL HYSTERECTOMY      Prior to Admission medications   Medication Sig Start Date End Date Taking? Authorizing Provider  cyclobenzaprine (FLEXERIL) 10 MG tablet Take 1 tablet (10 mg total) by mouth every 8 (eight) hours as needed for muscle spasms. 11/20/15   Charmayne Sheerharles M Beers, PA-C  diazepam (VALIUM) 2 MG tablet Take 1 tablet (2 mg total) by mouth every 6 (six) hours as needed for anxiety. 09/18/16   Rebecka ApleyAllison P Webster, MD  hydrochlorothiazide (HYDRODIURIL) 25 MG tablet Take 25 mg by mouth daily.    Historical Provider, MD  ibuprofen (ADVIL,MOTRIN) 600 MG tablet Take 1 tablet (600 mg total) by mouth every 8 (eight) hours as needed. 10/25/16   Joni Reiningonald K Smith, PA-C  ibuprofen (ADVIL,MOTRIN) 800 MG tablet Take 1 tablet (800 mg total) by mouth every 8 (eight) hours as needed. 11/20/15   Charmayne Sheerharles M Beers, PA-C  insulin detemir (LEVEMIR) 100 UNIT/ML injection Inject into the skin at bedtime.    Historical Provider, MD  lidocaine (LIDODERM) 5 % Place  1 patch onto the skin every 12 (twelve) hours. Remove & Discard patch within 12 hours or as directed by MD 09/18/16 09/18/17  Rebecka ApleyAllison P Webster, MD  lisinopril (PRINIVIL,ZESTRIL) 40 MG tablet Take 40 mg by mouth daily.    Historical Provider, MD  metFORMIN (GLUCOPHAGE) 500 MG tablet Take by mouth 2 (two) times daily with a meal.    Historical Provider, MD  oxyCODONE-acetaminophen (ROXICET) 5-325 MG tablet Take 1-2 tablets by mouth every 4 (four) hours as needed for severe pain. 11/20/15   Charmayne Sheerharles M Beers, PA-C  sulfamethoxazole-trimethoprim (BACTRIM DS,SEPTRA DS) 800-160 MG tablet Take 1 tablet by mouth 2 (two) times daily. 10/25/16   Joni Reiningonald K Smith, PA-C  traMADol (ULTRAM) 50 MG tablet Take 1 tablet (50 mg total) by mouth every 6 (six) hours as needed. 09/18/16   Rebecka ApleyAllison P Webster, MD  traMADol (ULTRAM) 50 MG tablet Take 1 tablet (50 mg total) by mouth every 6 (six) hours as needed for moderate pain. 10/25/16   Joni Reiningonald K Smith, PA-C    No Known Allergies  No family history on file.  Social History Social History  Substance Use Topics  . Smoking status: Never Smoker  . Smokeless tobacco: Never Used  . Alcohol use No    Review of Systems Constitutional: Negative for fever. Cardiovascular: Left-sided chest discomfort Respiratory: Negative for shortness of breath. Continued dry cough Gastrointestinal: Negative for abdominal pain, vomiting and diarrhea. Neurological: Negative for headache 10-point ROS otherwise negative.  ____________________________________________   PHYSICAL  EXAM:  VITAL SIGNS: ED Triage Vitals [11/08/16 1908]  Enc Vitals Group     BP 136/84     Pulse Rate 94     Resp 20     Temp 98.2 F (36.8 C)     Temp Source Oral     SpO2 100 %     Weight 180 lb (81.6 kg)     Height 5\' 4"  (1.626 m)     Head Circumference      Peak Flow      Pain Score 9     Pain Loc      Pain Edu?      Excl. in GC?     Constitutional: Alert and oriented. Well appearing and in no  distress. Eyes: Normal exam ENT   Head: Normocephalic and atraumatic   Mouth/Throat: Mucous membranes are moist. Cardiovascular: Normal rate, regular rhythm. No murmur Respiratory: Normal respiratory effort without tachypnea nor retractions. Breath sounds are clear Gastrointestinal: Soft and nontender. No distention. Musculoskeletal: Nontender with normal range of motion in all extremities. 2+ radial pulse and left upper extremity, no edema. Neurologic:  Normal speech and language. No gross focal neurologic deficits  Skin:  Skin is warm, dry and intact.  Psychiatric: Mood and affect are normal.   ____________________________________________    EKG  EKG reviewed and interpreted by myself shows normal sinus rhythm at 97 bpm, narrow QRS, normal axis, normal intervals, no concerning ST changes.  ____________________________________________    RADIOLOGY  Chest x-ray negative  ____________________________________________   INITIAL IMPRESSION / ASSESSMENT AND PLAN / ED COURSE  Pertinent labs & imaging results that were available during my care of the patient were reviewed by me and considered in my medical decision making (see chart for details).  The patient presents to the emergency department with continued cough with left-sided chest discomfort. Patient's physical exam is largely within normal limits, EKG is reassuring, chest x-ray is negative, labs including troponin are normal. Patient's pain could be due to pleurisy from her continued cough. We will discharge with a short course of cough medication. I discussed primary care follow-up. She continues to have discomfort as well as mine normal chest pain return precautions.  ____________________________________________   FINAL CLINICAL IMPRESSION(S) / ED DIAGNOSES  Chest pain    Minna Antis, MD 11/08/16 2035

## 2017-01-31 ENCOUNTER — Encounter: Payer: Self-pay | Admitting: Emergency Medicine

## 2017-01-31 ENCOUNTER — Emergency Department
Admission: EM | Admit: 2017-01-31 | Discharge: 2017-01-31 | Disposition: A | Discharge status: Home / Self Care | Payer: Medicaid Other | Attending: Emergency Medicine | Admitting: Emergency Medicine

## 2017-01-31 DIAGNOSIS — Z79899 Other long term (current) drug therapy: Secondary | ICD-10-CM | POA: Insufficient documentation

## 2017-01-31 DIAGNOSIS — I10 Essential (primary) hypertension: Secondary | ICD-10-CM | POA: Insufficient documentation

## 2017-01-31 DIAGNOSIS — L723 Sebaceous cyst: Secondary | ICD-10-CM | POA: Diagnosis present

## 2017-01-31 DIAGNOSIS — E119 Type 2 diabetes mellitus without complications: Secondary | ICD-10-CM | POA: Insufficient documentation

## 2017-01-31 DIAGNOSIS — L089 Local infection of the skin and subcutaneous tissue, unspecified: Secondary | ICD-10-CM

## 2017-01-31 DIAGNOSIS — Z794 Long term (current) use of insulin: Secondary | ICD-10-CM | POA: Diagnosis not present

## 2017-01-31 MED ORDER — CEPHALEXIN 500 MG PO CAPS: 500.0000 mg | ORAL_CAPSULE | Freq: Three times a day (TID) | ORAL | 0 refills | Status: DC

## 2017-01-31 NOTE — ED Provider Notes (Signed)
[facility] Emergency Department Provider Note ____________________________________________  Time seen: 1202  I have reviewed the triage vital signs and the nursing notes.  HISTORY  Chief Complaint  Insect Bite  HPI Kelly [name] is a 46 y.o. female presents to the ED for evaluation of what she assumes maybe an insect bite to her right upper chest. Patient describes a tender, firm, indurated area to theright anterior ribs, just under her bra strap. She reports there is been present for about 2 weeks but is coming increasingly tender and firm. She denies any spontaneous drainage, redness, warmth, or itching. She also denies any intermittent fevers, chills, sweats. She denies any previous history of abscesses, boils, or staph infection.  Past Medical History:  Diagnosis Date  . Diabetes mellitus without complication (HCC)   . Hypertension     There are no active problems to display for this patient.   Past Surgical History:  Procedure Laterality Date  . ABDOMINAL HYSTERECTOMY      Prior to Admission medications   Medication Sig Start Date End Date Taking? Authorizing Provider  cephALEXin (KEFLEX) 500 MG capsule Take 1 capsule (500 mg total) by mouth 3 (three) times daily. 01/31/17   Kealie Barrie, Charlesetta IvoryJenise V Bacon, PA-C  cyclobenzaprine (FLEXERIL) 10 MG tablet Take 1 tablet (10 mg total) by mouth every 8 (eight) hours as needed for muscle spasms. 11/20/15   Beers, Charmayne Sheerharles M, PA-C  diazepam (VALIUM) 2 MG tablet Take 1 tablet (2 mg total) by mouth every 6 (six) hours as needed for anxiety. 09/18/16   Rebecka ApleyWebster, Allison P, MD  guaiFENesin-codeine 100-10 MG/5ML syrup Take 5 mLs by mouth every 6 (six) hours as needed for cough. 11/08/16   Minna AntisPaduchowski, Kevin, MD  hydrochlorothiazide (HYDRODIURIL) 25 MG tablet Take 25 mg by mouth daily.    [provider]  ibuprofen (ADVIL,MOTRIN) 600 MG tablet Take 1 tablet (600 mg total) by mouth every 8 (eight) hours as needed.  10/25/16   Joni ReiningSmith, Ronald K, PA-C  ibuprofen (ADVIL,MOTRIN) 800 MG tablet Take 1 tablet (800 mg total) by mouth every 8 (eight) hours as needed. 11/20/15   Beers, Charmayne Sheerharles M, PA-C  insulin detemir (LEVEMIR) 100 UNIT/ML injection Inject into the skin at bedtime.    [provider]  lidocaine (LIDODERM) 5 % Place 1 patch onto the skin every 12 (twelve) hours. Remove & Discard patch within 12 hours or as directed by MD 09/18/16 09/18/17  Rebecka ApleyWebster, Allison P, MD  lisinopril (PRINIVIL,ZESTRIL) 40 MG tablet Take 40 mg by mouth daily.    [provider]  metFORMIN (GLUCOPHAGE) 500 MG tablet Take by mouth 2 (two) times daily with a meal.    [provider]  oxyCODONE-acetaminophen (ROXICET) 5-325 MG tablet Take 1-2 tablets by mouth every 4 (four) hours as needed for severe pain. 11/20/15   Beers, Charmayne Sheerharles M, PA-C  sulfamethoxazole-trimethoprim (BACTRIM DS,SEPTRA DS) 800-160 MG tablet Take 1 tablet by mouth 2 (two) times daily. 10/25/16   Joni ReiningSmith, Ronald K, PA-C  traMADol (ULTRAM) 50 MG tablet Take 1 tablet (50 mg total) by mouth every 6 (six) hours as needed. 09/18/16   Rebecka ApleyWebster, Allison P, MD  traMADol (ULTRAM) 50 MG tablet Take 1 tablet (50 mg total) by mouth every 6 (six) hours as needed for moderate pain. 10/25/16   Joni ReiningSmith, Ronald K, PA-C    Allergies Patient has no known allergies.  History reviewed. No pertinent family history.  Social History Social History  Substance Use Topics  . Smoking status:  Never Smoker  . Smokeless tobacco: Never Used  . Alcohol use No    Review of Systems  Constitutional: Negative for fever. Cardiovascular: Negative for chest pain. Respiratory: Negative for shortness of breath. Musculoskeletal: Negative for back pain. Skin: Negative for rash. Possible insect bite as above Neurological: Negative for headaches, focal weakness or numbness. ____________________________________________  PHYSICAL EXAM:  VITAL SIGNS: ED Triage Vitals [01/31/17 1114]   Enc Vitals Group     BP 134/74     Pulse Rate 84     Resp 16     Temp 98.3 F (36.8 C)     Temp Source Oral     SpO2 98 %     Weight 170 lb (77.1 kg)     Height 5\' 5"  (1.651 m)     Head Circumference      Peak Flow      Pain Score 7     Pain Loc      Pain Edu?      Excl. in GC?     Constitutional: Alert and oriented. Well appearing and in no distress. Head: Normocephalic and atraumatic. Cardiovascular: Normal rate, regular rhythm. Normal distal pulses. Respiratory: Normal respiratory effort. No wheezes/rales/rhonchi. Skin:  Skin is warm, dry and intact. No rash noted. Patient with a area of superficial erythema surrounding a central punctum. This deep, palpable subcutaneous skin lesion is firm and indurated to a diameter of about 1.5 cm. No fluctuance, pointing, or spontaneous drainage is noted. ____________________________________________  INITIAL IMPRESSION / ASSESSMENT AND PLAN / ED COURSE  Patient with what appears to be an infected sebaceous cyst to the and anterior right chest wall. No indication at this point for spontaneous drainage as the area is firm and without pointing, fluctuance, or spontaneous drainage. The patient is started on Keflex and will be treated for this infected sebaceous cyst. Wound precautions and follow-up indications are reviewed. She will return to the ED or see her provider at [facility] treated as discussed. ____________________________________________  FINAL CLINICAL IMPRESSION(S) / ED DIAGNOSES  Final diagnoses:  Infected sebaceous cyst of skin      Tryone Kille, Charlesetta Ivory, PA-C 01/31/17 1221    Charlynne Pander, MD 01/31/17 [phone]

## 2017-01-31 NOTE — ED Notes (Signed)
See triage note   States she noticed a possible insect bite under right breast about 1 week ago redness and tenderness noted to area

## 2017-01-31 NOTE — ED Triage Notes (Signed)
Pt reports got bit by insect one week ago under right breast.  C/o redness and pain at site, denies fevers. Ambulatory to triage without difficulty.

## 2017-01-31 NOTE — Discharge Instructions (Signed)
You are being treated for an infected sebaceous cyst of the skin. These benign cysts often become infected. Take the antibiotic as directed. Apply warm compresses to promote healing. Follow-up with your provider or return to the ED as discussed for possible incision and drainage.

## 2017-02-06 ENCOUNTER — Emergency Department
Admission: EM | Admit: 2017-02-06 | Discharge: 2017-02-06 | Disposition: A | Discharge status: Home / Self Care | Payer: Medicaid Other | Attending: Emergency Medicine | Admitting: Emergency Medicine

## 2017-02-06 ENCOUNTER — Encounter: Payer: Self-pay | Admitting: Emergency Medicine

## 2017-02-06 DIAGNOSIS — Z7984 Long term (current) use of oral hypoglycemic drugs: Secondary | ICD-10-CM | POA: Insufficient documentation

## 2017-02-06 DIAGNOSIS — L0291 Cutaneous abscess, unspecified: Secondary | ICD-10-CM

## 2017-02-06 DIAGNOSIS — L02211 Cutaneous abscess of abdominal wall: Secondary | ICD-10-CM | POA: Diagnosis present

## 2017-02-06 DIAGNOSIS — E119 Type 2 diabetes mellitus without complications: Secondary | ICD-10-CM | POA: Diagnosis not present

## 2017-02-06 DIAGNOSIS — I10 Essential (primary) hypertension: Secondary | ICD-10-CM | POA: Insufficient documentation

## 2017-02-06 DIAGNOSIS — Z79899 Other long term (current) drug therapy: Secondary | ICD-10-CM | POA: Insufficient documentation

## 2017-02-06 MED ORDER — LIDOCAINE HCL (PF) 1 % IJ SOLN
5.0000 mL | Freq: Once | INTRAMUSCULAR | Status: AC
  Administered 2017-02-06: 5 mL
  Filled 2017-02-06: qty 5

## 2017-02-06 MED ORDER — SULFAMETHOXAZOLE-TRIMETHOPRIM 800-160 MG PO TABS: 1.0000 | ORAL_TABLET | Freq: Two times a day (BID) | ORAL | 0 refills | Status: AC

## 2017-02-06 NOTE — ED Provider Notes (Signed)
[facility] Emergency Department Provider Note   ____________________________________________   I have reviewed the triage vital signs and the nursing notes.   HISTORY  Chief Complaint Abscess    HPI Kelly [name] is a 46 y.o. female presents to the ED for revaluation of what was likely an infected sebaceous cyst. Patient completed a course of antibiotics prescribed without improvement. Patient noted area became more swollen, tender and more erythematous. Patient also feels the area is getting more irritated by her bra strap. Patient denies any intermittent fevers, chills, sweats. She denies any previous history of abscesses, boils, or staph infection.   Past Medical History:  Diagnosis Date  . Diabetes mellitus without complication (HCC)   . Hypertension     There are no active problems to display for this patient.   Past Surgical History:  Procedure Laterality Date  . ABDOMINAL HYSTERECTOMY      Prior to Admission medications   Medication Sig Start Date End Date Taking? Authorizing Provider  cephALEXin (KEFLEX) 500 MG capsule Take 1 capsule (500 mg total) by mouth 3 (three) times daily. 01/31/17   Menshew, Charlesetta Ivory, PA-C  cyclobenzaprine (FLEXERIL) 10 MG tablet Take 1 tablet (10 mg total) by mouth every 8 (eight) hours as needed for muscle spasms. 11/20/15   Beers, Charmayne Sheer, PA-C  diazepam (VALIUM) 2 MG tablet Take 1 tablet (2 mg total) by mouth every 6 (six) hours as needed for anxiety. 09/18/16   Rebecka Apley, MD  guaiFENesin-codeine 100-10 MG/5ML syrup Take 5 mLs by mouth every 6 (six) hours as needed for cough. 11/08/16   Minna Antis, MD  hydrochlorothiazide (HYDRODIURIL) 25 MG tablet Take 25 mg by mouth daily.    [provider]  ibuprofen (ADVIL,MOTRIN) 600 MG tablet Take 1 tablet (600 mg total) by mouth every 8 (eight) hours as needed. 10/25/16   Joni Reining, PA-C  ibuprofen (ADVIL,MOTRIN) 800 MG tablet Take 1  tablet (800 mg total) by mouth every 8 (eight) hours as needed. 11/20/15   Beers, Charmayne Sheer, PA-C  insulin detemir (LEVEMIR) 100 UNIT/ML injection Inject into the skin at bedtime.    [provider]  lidocaine (LIDODERM) 5 % Place 1 patch onto the skin every 12 (twelve) hours. Remove & Discard patch within 12 hours or as directed by MD 09/18/16 09/18/17  Rebecka Apley, MD  lisinopril (PRINIVIL,ZESTRIL) 40 MG tablet Take 40 mg by mouth daily.    [provider]  metFORMIN (GLUCOPHAGE) 500 MG tablet Take by mouth 2 (two) times daily with a meal.    [provider]  oxyCODONE-acetaminophen (ROXICET) 5-325 MG tablet Take 1-2 tablets by mouth every 4 (four) hours as needed for severe pain. 11/20/15   Beers, Charmayne Sheer, PA-C  sulfamethoxazole-trimethoprim (BACTRIM DS,SEPTRA DS) 800-160 MG tablet Take 1 tablet by mouth 2 (two) times daily. 02/06/17 02/09/17  Nia Nathaniel M, PA-C  traMADol (ULTRAM) 50 MG tablet Take 1 tablet (50 mg total) by mouth every 6 (six) hours as needed. 09/18/16   Rebecka Apley, MD  traMADol (ULTRAM) 50 MG tablet Take 1 tablet (50 mg total) by mouth every 6 (six) hours as needed for moderate pain. 10/25/16   Joni Reining, PA-C    Allergies Patient has no known allergies.  No family history on file.  Social History Social History  Substance Use Topics  . Smoking status: Never Smoker  . Smokeless tobacco: Never Used  . Alcohol use No  Review of Systems Constitutional: Negative for fever. Cardiovascular: Negative for chest pain. Respiratory: Negative for shortness of breath. Musculoskeletal: Negative for back pain. Skin: Negative for rash. Possible infected sebaceous cyst or abscess. Neurological: Negative for headaches, focal weakness or numbness. ____________________________________________   PHYSICAL EXAM:  VITAL SIGNS: ED Triage Vitals  Enc Vitals Group     BP 02/06/17 1322 103/76     Pulse Rate 02/06/17 1322 85     Resp  02/06/17 1322 16     Temp 02/06/17 1322 98.3 F (36.8 C)     Temp Source 02/06/17 1322 Oral     SpO2 02/06/17 1322 100 %     Weight 02/06/17 1322 170 lb (77.1 kg)     Height 02/06/17 1322 5\' 5"  (1.651 m)     Head Circumference --      Peak Flow --      Pain Score 02/06/17 1328 8     Pain Loc --      Pain Edu? --      Excl. in GC? --     Constitutional: Alert and oriented. Well appearing and in no acute distress.  Head: Normocephalic and atraumatic. Cardiovascular: Normal rate, regular rhythm. Normal distal pulses. Respiratory: Normal respiratory effort. No wheezes/rales/rhonchi.  Skin: Skin is warm, dry and intact. No rash noted. Area of superficial erythema surrounding a central punctum. Small area of fluctuance with surrounding induration. Area ~3.0 x 5.0 cm. No spontaneous drainage is noted. ____________________________________________   LABS (all labs ordered are listed, but only abnormal results are displayed)  Labs Reviewed - No data to display ____________________________________________  EKG None ____________________________________________  RADIOLOGY None ____________________________________________   PROCEDURES  Procedure(s) performed: INCISION AND DRAINAGE Performed by: Clois Comberraci M Khristy Kalan Consent: Verbal consent obtained. Risks and benefits: risks, benefits and alternatives were discussed Type: abscess  Body area: upper right abdomen Anesthesia: local infiltration  Incision was made with a scalpel.  Local anesthetic: lidocaine 1% %   Anesthetic total: 4.0 ml  Complexity: complex Blunt dissection to break up loculations  Drainage: purulent  Drainage amount: ~8-10 ml  Packing material: 1/4 in iodoform gauze  Patient tolerance: Patient tolerated the procedure well with no immediate complications.    Critical Care performed: no ____________________________________________   INITIAL IMPRESSION / ASSESSMENT AND PLAN / ED COURSE  Pertinent  labs & imaging results that were available during my care of the patient were reviewed by me and considered in my medical decision making (see chart for details).  Patient presented with abscess along the right upper abdominal wall below the breast line. Patient originally seen and given a course of antibiotics however did not improve. Area of fluctuance was noted and the decision made to incise and drain as noted above in the procedures. Patient tolerated procedure without complications. Reviewed wound management with patient and instructed patient to return in 2 days for wound check. Patient verbalized understanding of return for wound check instructions. Patient prescribed short course of antibiotics as noted in discharge instructions. Patient informed of clinical course, understand medical decision-making process, and agree with plan.  Patient was advised to follow up with PCP and was also advised to return to the emergency department for symptoms that change or worsened.     ____________________________________________   FINAL CLINICAL IMPRESSION(S) / ED DIAGNOSES  Final diagnoses:  Abscess       NEW MEDICATIONS STARTED DURING THIS VISIT:  Discharge Medication List as of 02/06/2017  4:58 PM       Note:  This document was prepared using Dragon voice recognition software and may include unintentional dictation errors.   Clois Comber, PA-C 02/07/17 0220    Jeanmarie Plant, MD 02/10/17 [phone]

## 2017-02-06 NOTE — ED Triage Notes (Signed)
Says the abscess on right upper abd has gotten larger and may need to be lanced.

## 2017-02-06 NOTE — ED Notes (Signed)
States she was seen about 1 week ago   Thought she was bitten by insect   conts to have tenderness with swelling under right breast   Near bra strap.

## 2017-02-06 NOTE — Discharge Instructions (Signed)
Return to primary care doctor or the emergency department in 2 days for wound re-check.

## 2017-02-08 ENCOUNTER — Emergency Department
Admission: EM | Admit: 2017-02-08 | Discharge: 2017-02-08 | Disposition: A | Discharge status: Home / Self Care | Payer: Medicaid Other | Attending: Emergency Medicine | Admitting: Emergency Medicine

## 2017-02-08 ENCOUNTER — Encounter: Payer: Self-pay | Admitting: Medical Oncology

## 2017-02-08 DIAGNOSIS — Z7984 Long term (current) use of oral hypoglycemic drugs: Secondary | ICD-10-CM | POA: Insufficient documentation

## 2017-02-08 DIAGNOSIS — Z794 Long term (current) use of insulin: Secondary | ICD-10-CM | POA: Diagnosis not present

## 2017-02-08 DIAGNOSIS — E119 Type 2 diabetes mellitus without complications: Secondary | ICD-10-CM | POA: Insufficient documentation

## 2017-02-08 DIAGNOSIS — I10 Essential (primary) hypertension: Secondary | ICD-10-CM | POA: Diagnosis not present

## 2017-02-08 DIAGNOSIS — Z4801 Encounter for change or removal of surgical wound dressing: Secondary | ICD-10-CM | POA: Insufficient documentation

## 2017-02-08 DIAGNOSIS — Z09 Encounter for follow-up examination after completed treatment for conditions other than malignant neoplasm: Secondary | ICD-10-CM

## 2017-02-08 MED ORDER — LIDOCAINE HCL (PF) 1 % IJ SOLN
INTRAMUSCULAR | Status: AC
  Filled 2017-02-08: qty 5

## 2017-02-08 NOTE — ED Triage Notes (Signed)
Pt was seen for abscess 2 days ago and told to come for re-check.

## 2017-02-08 NOTE — ED Provider Notes (Signed)
[facility] Emergency Department Provider Note   ____________________________________________    I have reviewed the triage vital signs and the nursing notes.   HISTORY  Chief Complaint Follow-up     HPI Kelly [name] is a 46 y.o. female who presents for recheck after I&D of abscess. On The 21st of this month patient had I&D of skin abscess likely caused by underwire of her bra. Successful I&D was performed and she returns for recheck. She has been taking her abx. She reports pain has improved, no fevers or spreading redness. No nausea or vomiting. Does report continued drainage.    Past Medical History:  Diagnosis Date  . Diabetes mellitus without complication (HCC)   . Hypertension     There are no active problems to display for this patient.   Past Surgical History:  Procedure Laterality Date  . ABDOMINAL HYSTERECTOMY      Prior to Admission medications   Medication Sig Start Date End Date Taking? Authorizing Provider  cephALEXin (KEFLEX) 500 MG capsule Take 1 capsule (500 mg total) by mouth 3 (three) times daily. 01/31/17   Menshew, Charlesetta IvoryJenise V Bacon, PA-C  cyclobenzaprine (FLEXERIL) 10 MG tablet Take 1 tablet (10 mg total) by mouth every 8 (eight) hours as needed for muscle spasms. 11/20/15   Beers, Charmayne Sheerharles M, PA-C  diazepam (VALIUM) 2 MG tablet Take 1 tablet (2 mg total) by mouth every 6 (six) hours as needed for anxiety. 09/18/16   Rebecka ApleyWebster, Allison P, MD  guaiFENesin-codeine 100-10 MG/5ML syrup Take 5 mLs by mouth every 6 (six) hours as needed for cough. 11/08/16   Minna AntisPaduchowski, Kevin, MD  hydrochlorothiazide (HYDRODIURIL) 25 MG tablet Take 25 mg by mouth daily.    [provider]  ibuprofen (ADVIL,MOTRIN) 600 MG tablet Take 1 tablet (600 mg total) by mouth every 8 (eight) hours as needed. 10/25/16   Joni ReiningSmith, Ronald K, PA-C  ibuprofen (ADVIL,MOTRIN) 800 MG tablet Take 1 tablet (800 mg total) by mouth every 8 (eight) hours as needed. 11/20/15    Beers, Charmayne Sheerharles M, PA-C  insulin detemir (LEVEMIR) 100 UNIT/ML injection Inject into the skin at bedtime.    [provider]  lidocaine (LIDODERM) 5 % Place 1 patch onto the skin every 12 (twelve) hours. Remove & Discard patch within 12 hours or as directed by MD 09/18/16 09/18/17  Rebecka ApleyWebster, Allison P, MD  lisinopril (PRINIVIL,ZESTRIL) 40 MG tablet Take 40 mg by mouth daily.    [provider]  metFORMIN (GLUCOPHAGE) 500 MG tablet Take by mouth 2 (two) times daily with a meal.    [provider]  oxyCODONE-acetaminophen (ROXICET) 5-325 MG tablet Take 1-2 tablets by mouth every 4 (four) hours as needed for severe pain. 11/20/15   Beers, Charmayne Sheerharles M, PA-C  sulfamethoxazole-trimethoprim (BACTRIM DS,SEPTRA DS) 800-160 MG tablet Take 1 tablet by mouth 2 (two) times daily. 02/06/17 02/09/17  Little, Traci M, PA-C  traMADol (ULTRAM) 50 MG tablet Take 1 tablet (50 mg total) by mouth every 6 (six) hours as needed. 09/18/16   Rebecka ApleyWebster, Allison P, MD  traMADol (ULTRAM) 50 MG tablet Take 1 tablet (50 mg total) by mouth every 6 (six) hours as needed for moderate pain. 10/25/16   Joni ReiningSmith, Ronald K, PA-C     Allergies Patient has no known allergies.  No family history on file.  Social History Social History  Substance Use Topics  . Smoking status: Never Smoker  . Smokeless tobacco: Never Used  . Alcohol use No  Review of Systems  Constitutional: No fever/chills     Gastrointestinal: No abdominal pain.  No nausea, no vomiting.     Skin: Negative for rash.     ____________________________________________   PHYSICAL EXAM:  VITAL SIGNS: ED Triage Vitals  Enc Vitals Group     BP 02/08/17 1044 112/79     Pulse Rate 02/08/17 1043 (!) 103     Resp 02/08/17 1043 18     Temp 02/08/17 1043 98.6 F (37 C)     Temp Source 02/08/17 1043 Oral     SpO2 02/08/17 1043 98 %     Weight 02/08/17 1044 77.1 kg (170 lb)     Height 02/08/17 1044 1.651 m (5\' 5" )     Head  Circumference --      Peak Flow --      Pain Score 02/08/17 1043 4     Pain Loc --      Pain Edu? --      Excl. in GC? --     Constitutional: Alert and oriented. No acute distress. Pleasant and interactive  Head: Atraumatic.  Mouth/Throat: Mucous membranes are moist.   Cardiovascular: Normal rate, regular rhythm. Heart rate 88 on my exam  Respiratory: Normal respiratory effort.  No retractions.    Neurologic:  Normal speech and language. No gross focal neurologic deficits are appreciated.   Skin:  Skin is warm, dry and intact. Packing remains in place and small incision under her right breast, some surrounding induration, no clear fluctuance however no evidence of cellulitis or worsening infection   ____________________________________________   LABS (all labs ordered are listed, but only abnormal results are displayed)  Labs Reviewed - No data to display ____________________________________________  EKG   ____________________________________________  RADIOLOGY  None ____________________________________________   PROCEDURES  Procedure(s) performed: yes  INCISION AND DRAINAGE Performed by: Jene Every Consent: Verbal consent obtained. Risks and benefits: risks, benefits and alternatives were discussed Type: abscess   Anesthesia: local infiltration    Local anesthetic: lidocaine 1%   Anesthetic total: 3 ml   Blunt dissection performed to rupture any septations  Drainage: serosanguinous  Drainage amount: 4 cc  Packing material: 1/4 in iodoform gauze  Patient tolerance: Patient tolerated the procedure well with no immediate complications.       Critical Care performed: No ____________________________________________   INITIAL IMPRESSION / ASSESSMENT AND PLAN / ED COURSE  Pertinent labs & imaging results that were available during my care of the patient were reviewed by me and considered in my medical decision making (see chart for  details).  Given continued drainage and surrounding induration I numbed the area and performed blunt dissection but was unable to drain any further significant purulent material. Patient has been wearing bra with underwire pressing firmly against the abscess area, I recommended that she not do so. I will have her return in 2 days for an additional recheck to make sure this is healing properly. She understands the plan and knows that she can return sooner if worsening condition ____________________________________________   FINAL CLINICAL IMPRESSION(S) / ED DIAGNOSES  Final diagnoses:  Encounter for recheck of abscess following incision and drainage      NEW MEDICATIONS STARTED DURING THIS VISIT:  Discharge Medication List as of 02/08/2017 11:34 AM       Note:  This document was prepared using Dragon voice recognition software and may include unintentional dictation errors.    Jene Every, MD 02/08/17 [phone]

## 2017-02-10 ENCOUNTER — Encounter: Payer: Self-pay | Admitting: Emergency Medicine

## 2017-02-10 ENCOUNTER — Emergency Department
Admission: EM | Admit: 2017-02-10 | Discharge: 2017-02-10 | Disposition: A | Discharge status: Home / Self Care | Payer: Medicaid Other | Attending: Emergency Medicine | Admitting: Emergency Medicine

## 2017-02-10 DIAGNOSIS — Z794 Long term (current) use of insulin: Secondary | ICD-10-CM | POA: Diagnosis not present

## 2017-02-10 DIAGNOSIS — I10 Essential (primary) hypertension: Secondary | ICD-10-CM | POA: Insufficient documentation

## 2017-02-10 DIAGNOSIS — L02211 Cutaneous abscess of abdominal wall: Secondary | ICD-10-CM | POA: Insufficient documentation

## 2017-02-10 DIAGNOSIS — Z4800 Encounter for change or removal of nonsurgical wound dressing: Secondary | ICD-10-CM | POA: Diagnosis present

## 2017-02-10 DIAGNOSIS — E119 Type 2 diabetes mellitus without complications: Secondary | ICD-10-CM | POA: Diagnosis not present

## 2017-02-10 DIAGNOSIS — Z7984 Long term (current) use of oral hypoglycemic drugs: Secondary | ICD-10-CM | POA: Insufficient documentation

## 2017-02-10 DIAGNOSIS — Z79899 Other long term (current) drug therapy: Secondary | ICD-10-CM | POA: Insufficient documentation

## 2017-02-10 DIAGNOSIS — Z5189 Encounter for other specified aftercare: Secondary | ICD-10-CM

## 2017-02-10 MED ORDER — LIDOCAINE HCL (PF) 1 % IJ SOLN
INTRAMUSCULAR | Status: AC
  Filled 2017-02-10: qty 5

## 2017-02-10 MED ORDER — SULFAMETHOXAZOLE-TRIMETHOPRIM 800-160 MG PO TABS: 1.0000 | ORAL_TABLET | Freq: Two times a day (BID) | ORAL | 0 refills | Status: DC

## 2017-02-10 MED ORDER — HYDROCODONE-ACETAMINOPHEN 5-325 MG PO TABS: 1.0000 | ORAL_TABLET | ORAL | 0 refills | Status: DC | PRN

## 2017-02-10 NOTE — ED Provider Notes (Signed)
[facility] Emergency Department Provider Note  ____________________________________________  Time seen: Approximately 10:10 AM  I have reviewed the triage vital signs and the nursing notes.   HISTORY  Chief Complaint Wound Check  HPI Kelly [name] is a 46 y.o. female who presents to the emergency department for wound check after I&Dhere on 02/06/17. She states that the area stopped draining, but has another area just below that has become red and tender as well. She denies fever or other concerns.  Past Medical History:  Diagnosis Date  . Diabetes mellitus without complication (HCC)   . Hypertension     There are no active problems to display for this patient.   Past Surgical History:  Procedure Laterality Date  . ABDOMINAL HYSTERECTOMY      Prior to Admission medications   Medication Sig Start Date End Date Taking? Authorizing Provider  cephALEXin (KEFLEX) 500 MG capsule Take 1 capsule (500 mg total) by mouth 3 (three) times daily. 01/31/17   Menshew, Charlesetta Ivory, PA-C  cyclobenzaprine (FLEXERIL) 10 MG tablet Take 1 tablet (10 mg total) by mouth every 8 (eight) hours as needed for muscle spasms. 11/20/15   Beers, Charmayne Sheer, PA-C  diazepam (VALIUM) 2 MG tablet Take 1 tablet (2 mg total) by mouth every 6 (six) hours as needed for anxiety. 09/18/16   Rebecka Apley, MD  guaiFENesin-codeine 100-10 MG/5ML syrup Take 5 mLs by mouth every 6 (six) hours as needed for cough. 11/08/16   Minna Antis, MD  hydrochlorothiazide (HYDRODIURIL) 25 MG tablet Take 25 mg by mouth daily.    [provider]  HYDROcodone-acetaminophen (NORCO/VICODIN) 5-325 MG tablet Take 1 tablet by mouth every 4 (four) hours as needed for moderate pain. 02/10/17 02/10/18  Tran Randle, Rulon Eisenmenger B, FNP  ibuprofen (ADVIL,MOTRIN) 600 MG tablet Take 1 tablet (600 mg total) by mouth every 8 (eight) hours as needed. 10/25/16   Joni Reining, PA-C  ibuprofen (ADVIL,MOTRIN) 800 MG tablet  Take 1 tablet (800 mg total) by mouth every 8 (eight) hours as needed. 11/20/15   Beers, Charmayne Sheer, PA-C  insulin detemir (LEVEMIR) 100 UNIT/ML injection Inject into the skin at bedtime.    [provider]  lidocaine (LIDODERM) 5 % Place 1 patch onto the skin every 12 (twelve) hours. Remove & Discard patch within 12 hours or as directed by MD 09/18/16 09/18/17  Rebecka Apley, MD  lisinopril (PRINIVIL,ZESTRIL) 40 MG tablet Take 40 mg by mouth daily.    [provider]  metFORMIN (GLUCOPHAGE) 500 MG tablet Take by mouth 2 (two) times daily with a meal.    [provider]  oxyCODONE-acetaminophen (ROXICET) 5-325 MG tablet Take 1-2 tablets by mouth every 4 (four) hours as needed for severe pain. 11/20/15   Beers, Charmayne Sheer, PA-C  sulfamethoxazole-trimethoprim (BACTRIM DS,SEPTRA DS) 800-160 MG tablet Take 1 tablet by mouth 2 (two) times daily. 02/10/17   Addisson Frate, Rulon Eisenmenger B, FNP  traMADol (ULTRAM) 50 MG tablet Take 1 tablet (50 mg total) by mouth every 6 (six) hours as needed. 09/18/16   Rebecka Apley, MD  traMADol (ULTRAM) 50 MG tablet Take 1 tablet (50 mg total) by mouth every 6 (six) hours as needed for moderate pain. 10/25/16   Joni Reining, PA-C    Allergies Patient has no known allergies.  History reviewed. No pertinent family history.  Social History Social History  Substance Use Topics  . Smoking status: Never Smoker  . Smokeless tobacco: Never Used  .  Alcohol use No    Review of Systems Constitutional: No fever/chills Eyes: No visual changes. ENT: No sore throat. Gastrointestinal: No abdominal pain.  No nausea, no vomiting.  Musculoskeletal: Negative for pain. Skin: Positive for tenderness and erythema of skin under right breast. Neurological: Negative for headaches, focal weakness or numbness. _________________________________________   PHYSICAL EXAM:  VITAL SIGNS: ED Triage Vitals [02/10/17 1009]  Enc Vitals Group     BP 133/88     Pulse  Rate (!) 102     Resp 20     Temp 98.2 F (36.8 C)     Temp Source Oral     SpO2 99 %     Weight 170 lb (77.1 kg)     Height 5\' 5"  (1.651 m)     Head Circumference      Peak Flow      Pain Score 8     Pain Loc      Pain Edu?      Excl. in GC?     Constitutional: Alert and oriented. Well appearing and in no acute distress. Eyes: Conjunctivae are normal. EOMI. Nose: No congestion/rhinnorhea. Mouth/Throat: Mucous membranes are moist.  Cardiovascular:Good peripheral circulation. Respiratory: Normal respiratory effort.  No retractions.  Musculoskeletal: Full ROM throughout. Neurologic:  Normal speech and language. No gross focal neurologic deficits are appreciated. Speech is normal. No gait instability. Skin: Indurated and fluctuant area under site of 1st I&D. Iodoform packing remains in place of the first I&D site. Psychiatric: Mood and affect are normal. Speech and behavior are normal.  ____________________________________________   LABS (all labs ordered are listed, but only abnormal results are displayed)  Labs Reviewed - No data to display ____________________________________________  RADIOLOGY  Not indicated ____________________________________________   PROCEDURES  Procedure(s) performed:   INCISION AND DRAINAGE Performed by: Kem Boroughsari Sherron Mummert Consent: Verbal consent obtained. Risks and benefits: risks, benefits and alternatives were discussed Type: abscess  Body area: Lower chest wall  Anesthesia: local infiltration  Incision was made with a scalpel.  Local anesthetic: lidocaine 1% without epinephrine  Anesthetic total: 4 ml  Complexity: complex  Blunt dissection to break up loculations  Drainage: purulent  Drainage amount: small  Packing material: 1/4 in iodoform gauze  Patient tolerance: Patient tolerated the procedure well with no immediate complications.   Packing also removed and replaced in 1st I&D  site.  ___________________________________________   INITIAL IMPRESSION / ASSESSMENT AND PLAN / ED COURSE  Pertinent labs & imaging results that were available during my care of the patient were reviewed by me and considered in my medical decision making (see chart for details).  46 year old female presenting to the ER for wound check and evaluation of another lesion. Second I&D performed today with return of only a small amount of purulent drainage. She will be given a second round of antibiotics and Norco for pain. She is to pull the packing out in 2 days if the drainage has stopped, redness is gone, and pain has greatly improved. If any of these symptoms remain, she will leave the packing in place, see her PCP or return to the ER. ____________________________________________   FINAL CLINICAL IMPRESSION(S) / ED DIAGNOSES  Final diagnoses:  Visit for wound check  Abscess of skin of abdomen    Note:  This document was prepared using Dragon voice recognition software and may include unintentional dictation errors.     Chinita Pesterriplett, Liller Yohn B, FNP 02/10/17 1443    Jeanmarie PlantMcShane, James A, MD 02/10/17 [id number]

## 2017-02-10 NOTE — ED Triage Notes (Signed)
Pt here for check of wound. Had abscess drained under right breast 5/21.

## 2017-02-10 NOTE — Discharge Instructions (Signed)
Pull the packing out in 2 days if your pain has improved and the redness has gone away. If pain is still present or redness remains, leave the packing in place and come back here or see your PCP.

## 2017-03-07 ENCOUNTER — Emergency Department
Admission: EM | Admit: 2017-03-07 | Discharge: 2017-03-07 | Disposition: A | Discharge status: Home / Self Care | Payer: No Typology Code available for payment source | Attending: Emergency Medicine | Admitting: Emergency Medicine

## 2017-03-07 ENCOUNTER — Emergency Department: Payer: No Typology Code available for payment source

## 2017-03-07 ENCOUNTER — Encounter: Payer: Self-pay | Admitting: Emergency Medicine

## 2017-03-07 DIAGNOSIS — M25511 Pain in right shoulder: Secondary | ICD-10-CM | POA: Diagnosis not present

## 2017-03-07 DIAGNOSIS — Y929 Unspecified place or not applicable: Secondary | ICD-10-CM | POA: Insufficient documentation

## 2017-03-07 DIAGNOSIS — Y999 Unspecified external cause status: Secondary | ICD-10-CM | POA: Diagnosis not present

## 2017-03-07 DIAGNOSIS — Z5321 Procedure and treatment not carried out due to patient leaving prior to being seen by health care provider: Secondary | ICD-10-CM | POA: Diagnosis not present

## 2017-03-07 DIAGNOSIS — Y939 Activity, unspecified: Secondary | ICD-10-CM | POA: Diagnosis not present
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
## 2017-03-07 NOTE — ED Notes (Signed)
Pt seen walking toward lobby, stating she is leaving. Pt informed that the provider is currently in another room. Pt doesn't want to wait since other family members were already DC.

## 2017-04-20 ENCOUNTER — Encounter: Payer: Self-pay | Admitting: Emergency Medicine

## 2017-04-20 ENCOUNTER — Emergency Department
Admission: EM | Admit: 2017-04-20 | Discharge: 2017-04-20 | Disposition: A | Discharge status: Home / Self Care | Payer: Medicaid Other | Attending: Emergency Medicine | Admitting: Emergency Medicine

## 2017-04-20 ENCOUNTER — Emergency Department: Payer: Medicaid Other

## 2017-04-20 DIAGNOSIS — E119 Type 2 diabetes mellitus without complications: Secondary | ICD-10-CM | POA: Diagnosis not present

## 2017-04-20 DIAGNOSIS — R319 Hematuria, unspecified: Secondary | ICD-10-CM

## 2017-04-20 DIAGNOSIS — N390 Urinary tract infection, site not specified: Secondary | ICD-10-CM | POA: Diagnosis not present

## 2017-04-20 DIAGNOSIS — I10 Essential (primary) hypertension: Secondary | ICD-10-CM | POA: Diagnosis not present

## 2017-04-20 DIAGNOSIS — Z79899 Other long term (current) drug therapy: Secondary | ICD-10-CM | POA: Insufficient documentation

## 2017-04-20 DIAGNOSIS — R1011 Right upper quadrant pain: Secondary | ICD-10-CM | POA: Diagnosis present

## 2017-04-20 DIAGNOSIS — Z794 Long term (current) use of insulin: Secondary | ICD-10-CM | POA: Insufficient documentation

## 2017-04-20 DIAGNOSIS — R109 Unspecified abdominal pain: Secondary | ICD-10-CM

## 2017-04-20 LAB — URINALYSIS, COMPLETE (UACMP) WITH MICROSCOPIC
Bilirubin Urine: NEGATIVE
HGB URINE DIPSTICK: NEGATIVE
Ketones, ur: NEGATIVE mg/dL
NITRITE: NEGATIVE
PROTEIN: 30 mg/dL — AB
Specific Gravity, Urine: 1.014 (ref 1.005–1.030)
pH: 5 (ref 5.0–8.0)

## 2017-04-20 LAB — BASIC METABOLIC PANEL
Anion gap: 10 (ref 5–15)
BUN: 14 mg/dL (ref 6–20)
CHLORIDE: 104 mmol/L (ref 101–111)
CO2: 26 mmol/L (ref 22–32)
Calcium: 9.6 mg/dL (ref 8.9–10.3)
Creatinine, Ser: 0.79 mg/dL (ref 0.44–1.00)
GFR calc Af Amer: >60 mL/min (ref >60)
GLUCOSE: 281 mg/dL — AB (ref 65–99)
POTASSIUM: 3.8 mmol/L (ref 3.5–5.1)
Sodium: 140 mmol/L (ref 135–145)

## 2017-04-20 LAB — CBC
HEMATOCRIT: 39.6 % (ref 35.0–47.0)
Hemoglobin: 13.3 g/dL (ref 12.0–16.0)
MCH: 28.4 pg (ref 26.0–34.0)
MCHC: 33.7 g/dL (ref 32.0–36.0)
MCV: 84.5 fL (ref 80.0–100.0)
Platelets: 247 10*3/uL (ref 150–440)
RBC: 4.69 MIL/uL (ref 3.80–5.20)
RDW: 13.1 % (ref 11.5–14.5)
WBC: 6.5 10*3/uL (ref 3.6–11.0)

## 2017-04-20 MED ORDER — PHENAZOPYRIDINE HCL 200 MG PO TABS: 200.0000 mg | ORAL_TABLET | Freq: Three times a day (TID) | ORAL | 0 refills | Status: DC | PRN

## 2017-04-20 MED ORDER — CEPHALEXIN 500 MG PO CAPS: 500.0000 mg | ORAL_CAPSULE | Freq: Three times a day (TID) | ORAL | 0 refills | Status: DC

## 2017-04-20 MED ORDER — CEPHALEXIN 500 MG PO CAPS
500.0000 mg | ORAL_CAPSULE | Freq: Once | ORAL | Status: AC
  Administered 2017-04-20: 500 mg via ORAL
  Filled 2017-04-20: qty 1

## 2017-04-20 NOTE — ED Provider Notes (Signed)
[facility] Emergency Department Provider Note  Time seen: 7:17 PM  I have reviewed the triage vital signs and the nursing notes.   HISTORY  Chief Complaint Flank Pain    HPI Kelly [name] is a 46 y.o. female with a past medical history of hypertension, diabetes, kidney stones presents to the emergency department for 2 weeks of intermittent right-sided pain. According to the patient for the past 2 weeks she has been  intermittent flank pain which she describes as sharp. States she has a history of kidney stones and this feels similar, but she is concerned that it is lasting so long. States she saw blood in her urine approximately one week ago. Denies any dysuria. States intermittent nausea with one episode of vomiting yesterday. Denies fever, chest pain. Denies diarrhea.  Past Medical History:  Diagnosis Date  . Diabetes mellitus without complication (HCC)   . Hypertension     There are no active problems to display for this patient.   Past Surgical History:  Procedure Laterality Date  . ABDOMINAL HYSTERECTOMY      Prior to Admission medications   Medication Sig Start Date End Date Taking? Authorizing Provider  cephALEXin (KEFLEX) 500 MG capsule Take 1 capsule (500 mg total) by mouth 3 (three) times daily. 01/31/17   Menshew, Charlesetta IvoryJenise V Bacon, PA-C  cyclobenzaprine (FLEXERIL) 10 MG tablet Take 1 tablet (10 mg total) by mouth every 8 (eight) hours as needed for muscle spasms. 11/20/15   Beers, Charmayne Sheerharles M, PA-C  diazepam (VALIUM) 2 MG tablet Take 1 tablet (2 mg total) by mouth every 6 (six) hours as needed for anxiety. 09/18/16   Rebecka ApleyWebster, Allison P, MD  guaiFENesin-codeine 100-10 MG/5ML syrup Take 5 mLs by mouth every 6 (six) hours as needed for cough. 11/08/16   Minna AntisPaduchowski, Caili Escalera, MD  hydrochlorothiazide (HYDRODIURIL) 25 MG tablet Take 25 mg by mouth daily.    [provider]  HYDROcodone-acetaminophen (NORCO/VICODIN) 5-325 MG tablet Take 1 tablet by  mouth every 4 (four) hours as needed for moderate pain. 02/10/17 02/10/18  Triplett, Rulon Eisenmengerari B, FNP  ibuprofen (ADVIL,MOTRIN) 600 MG tablet Take 1 tablet (600 mg total) by mouth every 8 (eight) hours as needed. 10/25/16   Joni ReiningSmith, Ronald K, PA-C  ibuprofen (ADVIL,MOTRIN) 800 MG tablet Take 1 tablet (800 mg total) by mouth every 8 (eight) hours as needed. 11/20/15   Beers, Charmayne Sheerharles M, PA-C  insulin detemir (LEVEMIR) 100 UNIT/ML injection Inject into the skin at bedtime.    [provider]  lidocaine (LIDODERM) 5 % Place 1 patch onto the skin every 12 (twelve) hours. Remove & Discard patch within 12 hours or as directed by MD 09/18/16 09/18/17  Rebecka ApleyWebster, Allison P, MD  lisinopril (PRINIVIL,ZESTRIL) 40 MG tablet Take 40 mg by mouth daily.    [provider]  metFORMIN (GLUCOPHAGE) 500 MG tablet Take by mouth 2 (two) times daily with a meal.    [provider]  oxyCODONE-acetaminophen (ROXICET) 5-325 MG tablet Take 1-2 tablets by mouth every 4 (four) hours as needed for severe pain. 11/20/15   Beers, Charmayne Sheerharles M, PA-C  sulfamethoxazole-trimethoprim (BACTRIM DS,SEPTRA DS) 800-160 MG tablet Take 1 tablet by mouth 2 (two) times daily. 02/10/17   Triplett, Rulon Eisenmengerari B, FNP  traMADol (ULTRAM) 50 MG tablet Take 1 tablet (50 mg total) by mouth every 6 (six) hours as needed. 09/18/16   Rebecka ApleyWebster, Allison P, MD  traMADol (ULTRAM) 50 MG tablet Take 1 tablet (50 mg total) by mouth every 6 (  six) hours as needed for moderate pain. 10/25/16   Joni ReiningSmith, Ronald K, PA-C    No Known Allergies  History reviewed. No pertinent family history.  Social History Social History  Substance Use Topics  . Smoking status: Never Smoker  . Smokeless tobacco: Never Used  . Alcohol use No    Review of Systems Constitutional: Negative for fever. Cardiovascular: Negative for chest pain. Respiratory: Negative for shortness of breath. Gastrointestinal: Right flank pain. Positive for nausea and vomiting yesterday. Negative for  diarrhea Genitourinary: Negative for dysuria. Hematuria 1 week ago. Musculoskeletal: Right back pain Neurological: Negative for headache All other ROS negative  ____________________________________________   PHYSICAL EXAM:  VITAL SIGNS: ED Triage Vitals  Enc Vitals Group     BP 04/20/17 1852 131/85     Pulse Rate 04/20/17 1852 87     Resp 04/20/17 1852 16     Temp 04/20/17 1852 98.1 F (36.7 C)     Temp Source 04/20/17 1852 Oral     SpO2 04/20/17 1852 100 %     Weight 04/20/17 1851 181 lb (82.1 kg)     Height 04/20/17 1851 5\' 5"  (1.651 m)     Head Circumference --      Peak Flow --      Pain Score 04/20/17 1851 8     Pain Loc --      Pain Edu? --      Excl. in GC? --     Constitutional: Alert and oriented. Well appearing and in no distress. Eyes: Normal exam ENT   Head: Normocephalic and atraumatic.   Mouth/Throat: Mucous membranes are moist. Cardiovascular: Normal rate, regular rhythm. No murmur Respiratory: Normal respiratory effort without tachypnea nor retractions. Breath sounds are clear  Gastrointestinal: Soft and nontender. No distention.  Mild right CVA tenderness. Musculoskeletal: Nontender with normal range of motion in all extremities. Neurologic:  Normal speech and language. No gross focal neurologic deficits are appreciated. Skin:  Skin is warm, dry and intact.  Psychiatric: Mood and affect are normal. Speech and behavior are normal.   ____________________________________________     RADIOLOGY  CT negative  ____________________________________________   INITIAL IMPRESSION / ASSESSMENT AND PLAN / ED COURSE  Pertinent labs & imaging results that were available during my care of the patient were reviewed by me and considered in my medical decision making (see chart for details).  Patient presents to the emergency department with right flank pain. States intermittent pain over the past 2 weeks which is somewhat sharp in nature. Denies  dysuria. Patient's labs are largely within normal limits besides mild hyperglycemia. Urinalysis pending. Given the patient's right flank pain with complaints of hematuria 1 week ago I discussed with the patient taking a CT scan to evaluate for ureterolithiasis. I discussed pros and cons including radiation exposure/cancer risk. Patient is agreeable to CT scan.  CT negative. Urinalysis consistent with many bacteria. We will treat as a urinary tract infection.  ____________________________________________   FINAL CLINICAL IMPRESSION(S) / ED DIAGNOSES  Right flank pain Urinary tract infection   Minna AntisPaduchowski, Rilie Glanz, MD 04/20/17 [id number]

## 2017-04-20 NOTE — ED Notes (Signed)
E-signature pad in the room is not working, unable to obtain E-signature from the patient; discharge instructions reviewed, follow up care and home care reviewed, prescription medication reviewed; pt verbalized understanding.

## 2017-04-20 NOTE — ED Notes (Signed)

## 2017-04-20 NOTE — ED Triage Notes (Signed)
Pt c/o right flank pain for 2-3 days. Has seen some hematuria.  Does have hx kidney stones and this feels similar. Did vomit yesterday none today.  No fevers.  Ambulatory without difficulty.

## 2017-04-23 LAB — URINE CULTURE

## 2017-07-11 ENCOUNTER — Emergency Department
Admission: EM | Admit: 2017-07-11 | Discharge: 2017-07-11 | Disposition: A | Discharge status: Home / Self Care | Payer: Medicaid Other | Attending: Emergency Medicine | Admitting: Emergency Medicine

## 2017-07-11 ENCOUNTER — Inpatient Hospital Stay
Admission: AD | Admit: 2017-07-11 | Discharge: 2017-07-18 | DRG: 885 | Disposition: A | Discharge status: Home / Self Care | Payer: Medicaid Other | Attending: Psychiatry | Admitting: Psychiatry

## 2017-07-11 ENCOUNTER — Encounter: Payer: Self-pay | Admitting: Emergency Medicine

## 2017-07-11 DIAGNOSIS — Z915 Personal history of self-harm: Secondary | ICD-10-CM

## 2017-07-11 DIAGNOSIS — Z79899 Other long term (current) drug therapy: Secondary | ICD-10-CM | POA: Diagnosis not present

## 2017-07-11 DIAGNOSIS — Z23 Encounter for immunization: Secondary | ICD-10-CM | POA: Diagnosis not present

## 2017-07-11 DIAGNOSIS — F333 Major depressive disorder, recurrent, severe with psychotic symptoms: Secondary | ICD-10-CM | POA: Diagnosis present

## 2017-07-11 DIAGNOSIS — N3000 Acute cystitis without hematuria: Secondary | ICD-10-CM

## 2017-07-11 DIAGNOSIS — R440 Auditory hallucinations: Secondary | ICD-10-CM | POA: Diagnosis present

## 2017-07-11 DIAGNOSIS — E119 Type 2 diabetes mellitus without complications: Secondary | ICD-10-CM | POA: Diagnosis present

## 2017-07-11 DIAGNOSIS — R51 Headache: Secondary | ICD-10-CM | POA: Diagnosis not present

## 2017-07-11 DIAGNOSIS — I10 Essential (primary) hypertension: Secondary | ICD-10-CM | POA: Diagnosis present

## 2017-07-11 DIAGNOSIS — T383X6A Underdosing of insulin and oral hypoglycemic [antidiabetic] drugs, initial encounter: Secondary | ICD-10-CM | POA: Diagnosis present

## 2017-07-11 DIAGNOSIS — E1165 Type 2 diabetes mellitus with hyperglycemia: Secondary | ICD-10-CM | POA: Insufficient documentation

## 2017-07-11 DIAGNOSIS — Z91128 Patient's intentional underdosing of medication regimen for other reason: Secondary | ICD-10-CM | POA: Diagnosis not present

## 2017-07-11 DIAGNOSIS — G4700 Insomnia, unspecified: Secondary | ICD-10-CM | POA: Diagnosis present

## 2017-07-11 DIAGNOSIS — X58XXXA Exposure to other specified factors, initial encounter: Secondary | ICD-10-CM | POA: Diagnosis present

## 2017-07-11 DIAGNOSIS — N390 Urinary tract infection, site not specified: Secondary | ICD-10-CM | POA: Diagnosis present

## 2017-07-11 DIAGNOSIS — R739 Hyperglycemia, unspecified: Secondary | ICD-10-CM

## 2017-07-11 DIAGNOSIS — R45851 Suicidal ideations: Secondary | ICD-10-CM | POA: Diagnosis not present

## 2017-07-11 LAB — URINALYSIS, COMPLETE (UACMP) WITH MICROSCOPIC
BILIRUBIN URINE: NEGATIVE
HGB URINE DIPSTICK: NEGATIVE
KETONES UR: NEGATIVE mg/dL
NITRITE: POSITIVE — AB
Specific Gravity, Urine: 1.014 (ref 1.005–1.030)
pH: 5 (ref 5.0–8.0)

## 2017-07-11 LAB — CBC
HCT: 45.9 % (ref 35.0–47.0)
HEMOGLOBIN: 14.7 g/dL (ref 12.0–16.0)
MCH: 26.7 pg (ref 26.0–34.0)
MCHC: 32 g/dL (ref 32.0–36.0)
MCV: 83.6 fL (ref 80.0–100.0)
Platelets: 251 10*3/uL (ref 150–440)
RBC: 5.5 MIL/uL — AB (ref 3.80–5.20)
RDW: 13.3 % (ref 11.5–14.5)
WBC: 5.2 10*3/uL (ref 3.6–11.0)

## 2017-07-11 LAB — COMPREHENSIVE METABOLIC PANEL
ALBUMIN: 3.8 g/dL (ref 3.5–5.0)
ALK PHOS: 106 U/L (ref 38–126)
ALT: 40 U/L (ref 14–54)
ANION GAP: 14 (ref 5–15)
AST: 33 U/L (ref 15–41)
BUN: 6 mg/dL (ref 6–20)
CALCIUM: 9.7 mg/dL (ref 8.9–10.3)
CO2: 25 mmol/L (ref 22–32)
Chloride: 102 mmol/L (ref 101–111)
Creatinine, Ser: 0.85 mg/dL (ref 0.44–1.00)
GFR calc non Af Amer: >60 mL/min (ref >60)
GLUCOSE: 311 mg/dL — AB (ref 65–99)
Potassium: 3.4 mmol/L — ABNORMAL LOW (ref 3.5–5.1)
Sodium: 141 mmol/L (ref 135–145)
Total Bilirubin: 0.6 mg/dL (ref 0.3–1.2)
Total Protein: 8.2 g/dL — ABNORMAL HIGH (ref 6.5–8.1)

## 2017-07-11 LAB — ACETAMINOPHEN LEVEL

## 2017-07-11 LAB — GLUCOSE, CAPILLARY: Glucose-Capillary: 304 mg/dL — ABNORMAL HIGH (ref 65–99)

## 2017-07-11 LAB — URINE DRUG SCREEN, QUALITATIVE (ARMC ONLY)
AMPHETAMINES, UR SCREEN: NOT DETECTED
BARBITURATES, UR SCREEN: NOT DETECTED
Benzodiazepine, Ur Scrn: NOT DETECTED
COCAINE METABOLITE, UR ~~LOC~~: NOT DETECTED
Cannabinoid 50 Ng, Ur ~~LOC~~: NOT DETECTED
MDMA (ECSTASY) UR SCREEN: NOT DETECTED
METHADONE SCREEN, URINE: NOT DETECTED
OPIATE, UR SCREEN: NOT DETECTED
Phencyclidine (PCP) Ur S: NOT DETECTED
TRICYCLIC, UR SCREEN: NOT DETECTED

## 2017-07-11 LAB — PREGNANCY, URINE: PREG TEST UR: NEGATIVE

## 2017-07-11 LAB — SALICYLATE LEVEL

## 2017-07-11 LAB — ETHANOL: Alcohol, Ethyl (B): <10 mg/dL (ref <10)

## 2017-07-11 MED ORDER — METOPROLOL TARTRATE 25 MG PO TABS
25.0000 mg | ORAL_TABLET | Freq: Once | ORAL | Status: AC
  Administered 2017-07-11: 25 mg via ORAL
  Filled 2017-07-11: qty 1

## 2017-07-11 MED ORDER — FLUOXETINE HCL 20 MG PO CAPS
20.0000 mg | ORAL_CAPSULE | Freq: Every day | ORAL | Status: DC
  Administered 2017-07-11: 20 mg via ORAL
  Filled 2017-07-11: qty 1

## 2017-07-11 MED ORDER — QUETIAPINE FUMARATE 25 MG PO TABS: 100.0000 mg | ORAL_TABLET | Freq: Every day | ORAL | Status: DC

## 2017-07-11 MED ORDER — INSULIN ASPART 100 UNIT/ML ~~LOC~~ SOLN
0.0000 [IU] | Freq: Three times a day (TID) | SUBCUTANEOUS | Status: DC
  Administered 2017-07-12: 5 [IU] via SUBCUTANEOUS
  Administered 2017-07-12: 2 [IU] via SUBCUTANEOUS
  Administered 2017-07-12: 15 [IU] via SUBCUTANEOUS
  Administered 2017-07-13: 3 [IU] via SUBCUTANEOUS
  Administered 2017-07-13: 8 [IU] via SUBCUTANEOUS
  Administered 2017-07-13: 5 [IU] via SUBCUTANEOUS
  Administered 2017-07-14: 8 [IU] via SUBCUTANEOUS
  Administered 2017-07-14: 5 [IU] via SUBCUTANEOUS
  Administered 2017-07-14: 3 [IU] via SUBCUTANEOUS
  Administered 2017-07-15 (×3): 8 [IU] via SUBCUTANEOUS
  Administered 2017-07-16 – 2017-07-17 (×4): 5 [IU] via SUBCUTANEOUS
  Administered 2017-07-17: 8 [IU] via SUBCUTANEOUS
  Administered 2017-07-18: 5 [IU] via SUBCUTANEOUS
  Administered 2017-07-18: 8 [IU] via SUBCUTANEOUS
  Administered 2017-07-18: 3 [IU] via SUBCUTANEOUS
  Filled 2017-07-11 (×12): qty 1

## 2017-07-11 MED ORDER — HYDROCHLOROTHIAZIDE 25 MG PO TABS
25.0000 mg | ORAL_TABLET | Freq: Every day | ORAL | Status: DC
  Administered 2017-07-11: 25 mg via ORAL
  Filled 2017-07-11: qty 1

## 2017-07-11 MED ORDER — ACETAMINOPHEN 325 MG PO TABS
650.0000 mg | ORAL_TABLET | Freq: Four times a day (QID) | ORAL | Status: DC | PRN
  Administered 2017-07-14 – 2017-07-16 (×4): 650 mg via ORAL
  Filled 2017-07-11 (×5): qty 2

## 2017-07-11 MED ORDER — LISINOPRIL 10 MG PO TABS
40.0000 mg | ORAL_TABLET | Freq: Every day | ORAL | Status: DC
  Administered 2017-07-11: 40 mg via ORAL
  Filled 2017-07-11: qty 4

## 2017-07-11 MED ORDER — METFORMIN HCL 500 MG PO TABS
500.0000 mg | ORAL_TABLET | Freq: Two times a day (BID) | ORAL | Status: DC
  Administered 2017-07-12: 500 mg via ORAL
  Filled 2017-07-11: qty 1

## 2017-07-11 MED ORDER — QUETIAPINE FUMARATE 100 MG PO TABS
100.0000 mg | ORAL_TABLET | Freq: Every day | ORAL | Status: DC
  Administered 2017-07-11: 100 mg via ORAL
  Filled 2017-07-11: qty 1

## 2017-07-11 MED ORDER — FLUOXETINE HCL 20 MG PO CAPS
20.0000 mg | ORAL_CAPSULE | Freq: Every day | ORAL | Status: DC
  Administered 2017-07-12 – 2017-07-14 (×3): 20 mg via ORAL
  Filled 2017-07-11 (×3): qty 1

## 2017-07-11 MED ORDER — TRAZODONE HCL 100 MG PO TABS
100.0000 mg | ORAL_TABLET | Freq: Every evening | ORAL | Status: DC | PRN
  Administered 2017-07-11: 100 mg via ORAL
  Filled 2017-07-11: qty 1

## 2017-07-11 MED ORDER — METFORMIN HCL 500 MG PO TABS: 500.0000 mg | ORAL_TABLET | Freq: Two times a day (BID) | ORAL | Status: DC

## 2017-07-11 MED ORDER — INSULIN ASPART 100 UNIT/ML ~~LOC~~ SOLN: 0.0000 [IU] | Freq: Three times a day (TID) | SUBCUTANEOUS | Status: DC

## 2017-07-11 MED ORDER — SULFAMETHOXAZOLE-TRIMETHOPRIM 800-160 MG PO TABS: 1.0000 | ORAL_TABLET | Freq: Two times a day (BID) | ORAL | Status: DC

## 2017-07-11 MED ORDER — HYDROCHLOROTHIAZIDE 25 MG PO TABS
25.0000 mg | ORAL_TABLET | Freq: Every day | ORAL | Status: DC
Start: 2017-07-12 — End: 2017-07-18
  Administered 2017-07-12 – 2017-07-18 (×7): 25 mg via ORAL
  Filled 2017-07-11 (×7): qty 1

## 2017-07-11 MED ORDER — ALUM & MAG HYDROXIDE-SIMETH 200-200-20 MG/5ML PO SUSP: 30.0000 mL | ORAL | Status: DC | PRN

## 2017-07-11 MED ORDER — INSULIN ASPART 100 UNIT/ML ~~LOC~~ SOLN
SUBCUTANEOUS | Status: AC
  Filled 2017-07-11: qty 1

## 2017-07-11 MED ORDER — INSULIN ASPART 100 UNIT/ML ~~LOC~~ SOLN
10.0000 [IU] | Freq: Once | SUBCUTANEOUS | Status: AC
  Administered 2017-07-11: 10 [IU] via SUBCUTANEOUS
  Filled 2017-07-11: qty 1

## 2017-07-11 MED ORDER — LISINOPRIL 20 MG PO TABS
40.0000 mg | ORAL_TABLET | Freq: Every day | ORAL | Status: DC
  Administered 2017-07-13 – 2017-07-18 (×6): 40 mg via ORAL
  Filled 2017-07-11 (×7): qty 2

## 2017-07-11 MED ORDER — MAGNESIUM HYDROXIDE 400 MG/5ML PO SUSP: 30.0000 mL | Freq: Every day | ORAL | Status: DC | PRN
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
## 2017-07-11 NOTE — ED Provider Notes (Addendum)
[facility] Emergency Department Provider Note  ____________________________________________  Time seen: Approximately 5:02 PM  I have reviewed the triage vital signs and the nursing notes.   HISTORY  Chief Complaint Suicidal    HPI Kelly [name] is a 46 y.o. female with a history of hypertension and diabetes, depression, presenting for suicidal ideations and auditory hallucinations. The patient reports that last week, she was having some auditory hallucinations, which is new for her. Over the last several days, she has been feeling depressed and has had thoughts of killing herself by overdosing on Tylenol, Tylenol PM, and other medications." She denies any homicidal ideations or visual hallucinations. She has no medical complaints at this time.   Past Medical History:  Diagnosis Date  . Diabetes mellitus without complication (HCC)   . Hypertension     There are no active problems to display for this patient.   Past Surgical History:  Procedure Laterality Date  . ABDOMINAL HYSTERECTOMY      Current Outpatient Rx  . Order #: [id number] Class: Print  . Order #: [id number] Class: Print  . Order #: [id number] Class: Print  . Order #: [id number] Class: Print  . Order #: [id number] Class: Historical Med  . Order #: [id number] Class: Print  . Order #: [id number] Class: Print  . Order #: [id number] Class: Print  . Order #: [id number] Class: Historical Med  . Order #: [id number] Class: Print  . Order #: [id number] Class: Historical Med  . Order #: [id number] Class: Historical Med  . Order #: [id number] Class: Print  . Order #: [id number] Class: Print  . Order #: [id number] Class: Print  . Order #: [id number] Class: Print  . Order #: [id number] Class: Print    Allergies Patient has no known allergies.  No family history on file.  Social History Social History  Substance Use Topics  . Smoking status: Never Smoker  . Smokeless tobacco: Never Used  . Alcohol use No     Review of Systems Constitutional: No fever/chills.no lightheadedness or syncope. Eyes: No visual changes. ENT: No sore throat. No congestion or rhinorrhea. Cardiovascular: Denies chest pain. Denies palpitations. Respiratory: Denies shortness of breath.  No cough. Gastrointestinal: No abdominal pain.  No nausea, no vomiting.  No diarrhea.  No constipation. Genitourinary: Negative for dysuria. Musculoskeletal: Negative for back pain. Skin: Negative for rash. Neurological: Negative for headaches. No focal numbness, tingling or weakness.  Psychiatric:positive depression. Positive suicidal ideations. Positive auditory hallucinations.  ____________________________________________   PHYSICAL EXAM:  VITAL SIGNS: ED Triage Vitals  Enc Vitals Group     BP 07/11/17 1621 (!) 157/109     Pulse Rate 07/11/17 1621 (!) 102     Resp 07/11/17 1621 15     Temp 07/11/17 1621 97.9 F (36.6 C)     Temp Source 07/11/17 1621 Oral     SpO2 07/11/17 1621 100 %     Weight 07/11/17 1621 180 lb (81.6 kg)     Height 07/11/17 1621 5\' 6"  (1.676 m)     Head Circumference --      Peak Flow --      Pain Score 07/11/17 1645 9     Pain Loc --      Pain Edu? --      Excl. in GC? --     Constitutional: Alert and oriented. Well appearing and in no acute distress. Answers questions appropriately. Eyes: Conjunctivae are normal.  EOMI. No scleral icterus. Head: Atraumatic. Nose: No congestion/rhinnorhea. Mouth/Throat: Mucous membranes are moist.  Neck: No stridor.  Supple.  No meningismus Cardiovascular: Normal rate,  regular rhythm. No murmurs, rubs or gallops.  Respiratory: Normal respiratory effort.  No accessory muscle use or retractions. Lungs CTAB.  No wheezes, rales or ronchi. Musculoskeletal: No LE edema.  Neurologic:  A&Ox3.  Speech is clear.  Face and smile are symmetric.  EOMI.  Moves all extremities well. Skin:  Skin is warm, dry and intact. No rash noted. Psychiatric: the patient has a  depressed mood and affect. She has normal speech without flight of ideas or pressured speech. She has suicidal ideations, auditory hallucinations, but denies HI or visual hallucinations.  ____________________________________________   LABS (all labs ordered are listed, but only abnormal results are displayed)  Labs Reviewed  COMPREHENSIVE METABOLIC PANEL - Abnormal; Notable for the following:       Result Value   Potassium 3.4 (*)    Glucose, Bld 311 (*)    Total Protein 8.2 (*)    All other components within normal limits  CBC - Abnormal; Notable for the following:    RBC 5.50 (*)    All other components within normal limits  ETHANOL  SALICYLATE LEVEL  ACETAMINOPHEN LEVEL  URINE DRUG SCREEN, QUALITATIVE ([facility] ONLY)  URINALYSIS, COMPLETE (UACMP) WITH MICROSCOPIC  POC URINE PREG, ED   ____________________________________________  EKG  ED ECG REPORT I, Rockne MenghiniNorman, Anne-Caroline, the attending physician, personally viewed and interpreted this ECG.   Date: 07/11/2017  EKG Time: 1920  Rate: 89  Rhythm: normal sinus rhythm  Axis: leftward  Intervals:none  ST&T Change: No STEMI  ____________________________________________  RADIOLOGY  No results found.  ____________________________________________   PROCEDURES  Procedure(s) performed: None  Procedures  Critical Care performed: No ____________________________________________   INITIAL IMPRESSION / ASSESSMENT AND PLAN / ED COURSE  Pertinent labs & imaging results that were available during my care of the patient were reviewed by me and considered in my medical decision making (see chart for details).  46 y.o. female with a history of depression presenting with depression and suicidal ideations, new auditory hallucinations last week that has now resolved. Overall, the patient is mildly hypertensivebut she does have essential hypertension at baseline.she also has hyperglycemia, which is likely due to her baseline  diabetes. She has no medical complaints at this time.I will check a Tylenol level, given that she states that she would use this for her overdose. We will also check basic labs, thyroid panel due to her new hallucinations. Age 89 would be atypical for first schizophrenic break but this is on the differential. An intracranial mass is much less likely as there are no other focal neurologic deficits.the patient has been placed under involuntary commitment and a psychiatry consult has been called.  ----------------------------------------- 5:57 PM on 07/11/2017 -----------------------------------------  I spoke with Dr. Delaney Meigslaypacs, the psychiatrist on-call, who plans to admit the patient. At this time there are no inpatient beds available, but [facility] as [facility] or at an outside facility if a bed can be found.  ____________________________________________  FINAL CLINICAL IMPRESSION(S) / ED DIAGNOSES  Final diagnoses:  Suicidal ideation  Severe episode of recurrent major depressive disorder, with psychotic features (HCC)  Auditory hallucination  Hyperglycemia  Essential hypertension         NEW MEDICATIONS STARTED DURING THIS VISIT:  New Prescriptions   No medications on file      Rockne MenghiniNorman, Anne-Caroline, MD 07/11/17 1712    Rockne MenghiniNorman, Anne-Caroline, MD 07/11/17 1757    Rockne MenghiniNorman, Anne-Caroline, MD 07/11/17 1924

## 2017-07-11 NOTE — ED Notes (Signed)
Spoke with patients daughter, per patients request, and gave her the passcode ([id number]).
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
## 2017-07-11 NOTE — ED Notes (Signed)
BEHAVIORAL HEALTH ROUNDING Patient sleeping: No. Patient alert and oriented: yes Behavior appropriate: Yes.  ; If no, describe:  Nutrition and fluids offered: yes Toileting and hygiene offered: Yes  Sitter present: q15 minute observations and security  monitoring Law enforcement present: Yes  ODS  

## 2017-07-11 NOTE — ED Notes (Signed)
Pt lying on stretcher, lights dimmed. Watching television with blanket covering her. Denies any needs at this time.

## 2017-07-11 NOTE — ED Notes (Signed)
[facility]

## 2017-07-11 NOTE — ED Notes (Signed)

## 2017-07-11 NOTE — ED Notes (Signed)
Pt informed that she will be staying in ED 21 until 9:15 and then will be moved to room 307. BHU updated.

## 2017-07-11 NOTE — ED Notes (Signed)
Pt informed of move from ED 21 to BHU 8. Pt agrees to room change.

## 2017-07-11 NOTE — BH Assessment (Signed)
[facility] Amend[facility]acs.  Attending Physician will be Dr. Johnella MoloneyMcNew.   Patient [facility] room 307[facility] [place] Intake Paper Work has been signed and placed on patient chart.  ER staff is aware of the admission

## 2017-07-11 NOTE — ED Triage Notes (Signed)
Patient presents to ED via POV from home with SI. Patient states, "I would take a bunch of pills". Patient denies having access to pills that would kill her if she took a large quantity. Patient crying during triage. Cooperative, makes eye contact. Patient states she has felt this way for 2 weeks. Patient denies HI.

## 2017-07-12 LAB — HEMOGLOBIN A1C
HEMOGLOBIN A1C: 11.5 % — AB (ref 4.8–5.6)
Hgb A1c MFr Bld: 11.4 % — ABNORMAL HIGH (ref 4.8–5.6)
MEAN PLASMA GLUCOSE: 283.35 mg/dL
MEAN PLASMA GLUCOSE: 280.48 mg/dL

## 2017-07-12 LAB — GLUCOSE, CAPILLARY
GLUCOSE-CAPILLARY: 371 mg/dL — AB (ref 65–99)
GLUCOSE-CAPILLARY: 224 mg/dL — AB (ref 65–99)
Glucose-Capillary: 143 mg/dL — ABNORMAL HIGH (ref 65–99)
Glucose-Capillary: 223 mg/dL — ABNORMAL HIGH (ref 65–99)

## 2017-07-12 LAB — TSH: TSH: 1.446 u[IU]/mL (ref 0.350–4.500)

## 2017-07-12 LAB — LIPID PANEL
CHOLESTEROL: 143 mg/dL (ref 0–200)
HDL: 35 mg/dL — ABNORMAL LOW (ref >40)
LDL CALC: 84 mg/dL (ref 0–99)
TRIGLYCERIDES: 121 mg/dL (ref <150)
Total CHOL/HDL Ratio: 4.1 RATIO
VLDL: 24 mg/dL (ref 0–40)

## 2017-07-12 MED ORDER — TRAZODONE HCL 50 MG PO TABS: 50.0000 mg | ORAL_TABLET | Freq: Every evening | ORAL | Status: DC | PRN

## 2017-07-12 MED ORDER — METFORMIN HCL ER 500 MG PO TB24
500.0000 mg | ORAL_TABLET | Freq: Two times a day (BID) | ORAL | Status: DC
  Filled 2017-07-12 (×7): qty 1

## 2017-07-12 MED ORDER — SULFAMETHOXAZOLE-TRIMETHOPRIM 800-160 MG PO TABS
1.0000 | ORAL_TABLET | Freq: Two times a day (BID) | ORAL | Status: DC
  Administered 2017-07-12 – 2017-07-18 (×13): 1 via ORAL
  Filled 2017-07-12 (×15): qty 1

## 2017-07-12 MED ORDER — INSULIN GLARGINE 100 UNIT/ML ~~LOC~~ SOLN
17.0000 [IU] | Freq: Every day | SUBCUTANEOUS | Status: DC
  Filled 2017-07-12: qty 0.17

## 2017-07-12 MED ORDER — INSULIN ASPART 100 UNIT/ML ~~LOC~~ SOLN
0.0000 [IU] | Freq: Every day | SUBCUTANEOUS | Status: DC
  Administered 2017-07-12: 5 [IU] via SUBCUTANEOUS
  Administered 2017-07-13: 2 [IU] via SUBCUTANEOUS
  Administered 2017-07-14 – 2017-07-15 (×2): 3 [IU] via SUBCUTANEOUS
  Administered 2017-07-16: 2 [IU] via SUBCUTANEOUS
  Filled 2017-07-12 (×2): qty 1

## 2017-07-12 MED ORDER — LAMOTRIGINE 25 MG PO TABS: 25.0000 mg | ORAL_TABLET | Freq: Every day | ORAL | Status: DC

## 2017-07-12 MED ORDER — INSULIN GLARGINE 100 UNIT/ML ~~LOC~~ SOLN
17.0000 [IU] | Freq: Every day | SUBCUTANEOUS | Status: DC
  Administered 2017-07-12: 17 [IU] via SUBCUTANEOUS
  Filled 2017-07-12 (×2): qty 0.17

## 2017-07-12 NOTE — Tx Team (Signed)
Interdisciplinary Treatment and Diagnostic Plan Update  07/12/2017 Time of Session: 1045 Kelly [name] MRN: [id number]  Principal Diagnosis: Severe recurrent major depression with psychotic features [facility](HCC)  Secondary Diagnoses: Principal Problem:   Severe recurrent major depression with psychotic features (HCC)   Current Medications:  Current Facility-Administered Medications  Medication Dose Route Frequency Provider Last Rate Last Dose  . acetaminophen (TYLENOL) tablet 650 mg  650 mg Oral Q6H PRN Clapacs, John T, MD      . alum & mag hydroxide-simeth (MAALOX/MYLANTA) 200-200-20 MG/5ML suspension 30 mL  30 mL Oral Q4H PRN Clapacs, John T, MD      . FLUoxetine (PROZAC) capsule 20 mg  20 mg Oral Daily Clapacs, John T, MD   20 mg at 07/12/17 0810  . hydrochlorothiazide (HYDRODIURIL) tablet 25 mg  25 mg Oral Daily Clapacs, Jackquline DenmarkJohn T, MD   25 mg at 07/12/17 0809  . insulin aspart (novoLOG) injection 0-15 Units  0-15 Units Subcutaneous TID WC Clapacs, Jackquline DenmarkJohn T, MD   5 Units at 07/12/17 1208  . insulin aspart (novoLOG) injection 0-5 Units  0-5 Units Subcutaneous QHS McNew, Holly R, MD      . insulin glargine (LANTUS) injection 17 Units  17 Units Subcutaneous QHS McNew, Holly R, MD      . lisinopril (PRINIVIL,ZESTRIL) tablet 40 mg  40 mg Oral Daily Clapacs, John T, MD      . magnesium hydroxide (MILK OF MAGNESIA) suspension 30 mL  30 mL Oral Daily PRN Clapacs, John T, MD      . metFORMIN (GLUCOPHAGE-XR) 24 hr tablet 500 mg  500 mg Oral BID WC McNew, Holly R, MD      . sulfamethoxazole-trimethoprim (BACTRIM DS,SEPTRA DS) 800-160 MG per tablet 1 tablet  1 tablet Oral Q12H McNew, Ileene HutchinsonHolly R, MD   1 tablet at 07/12/17 1406  . traZODone (DESYREL) tablet 50 mg  50 mg Oral QHS PRN McNew, Ileene HutchinsonHolly R, MD       PTA Medications: Prescriptions Prior to Admission  Medication Sig Dispense Refill Last Dose  . cephALEXin (KEFLEX) 500 MG capsule Take 1 capsule (500 mg total) by mouth 3 (three) times daily. 30 capsule 0    . cyclobenzaprine (FLEXERIL) 10 MG tablet Take 1 tablet (10 mg total) by mouth every 8 (eight) hours as needed for muscle spasms. 30 tablet 1   . diazepam (VALIUM) 2 MG tablet Take 1 tablet (2 mg total) by mouth every 6 (six) hours as needed for anxiety. 12 tablet 0   . guaiFENesin-codeine 100-10 MG/5ML syrup Take 5 mLs by mouth every 6 (six) hours as needed for cough. 120 mL 0   . hydrochlorothiazide (HYDRODIURIL) 25 MG tablet Take 25 mg by mouth daily.     [name]. HYDROcodone-acetaminophen (NORCO/VICODIN) 5-325 MG tablet Take 1 tablet by mouth every 4 (four) hours as needed for moderate pain. 20 tablet 0   . ibuprofen (ADVIL,MOTRIN) 600 MG tablet Take 1 tablet (600 mg total) by mouth every 8 (eight) hours as needed. 15 tablet 0   . ibuprofen (ADVIL,MOTRIN) 800 MG tablet Take 1 tablet (800 mg total) by mouth every 8 (eight) hours as needed. 30 tablet 0   . insulin detemir (LEVEMIR) 100 UNIT/ML injection Inject into the skin at bedtime.     . lidocaine (LIDODERM) 5 % Place 1 patch onto the skin every 12 (twelve) hours. Remove & Discard patch within 12 hours or as directed by MD 10 patch 0   . lisinopril (PRINIVIL,ZESTRIL) 40 MG  tablet Take 40 mg by mouth daily.     . metFORMIN (GLUCOPHAGE) 500 MG tablet Take by mouth 2 (two) times daily with a meal.     . oxyCODONE-acetaminophen (ROXICET) 5-325 MG tablet Take 1-2 tablets by mouth every 4 (four) hours as needed for severe pain. 15 tablet 0   . phenazopyridine (PYRIDIUM) 200 MG tablet Take 1 tablet (200 mg total) by mouth 3 (three) times daily as needed for pain. 20 tablet 0   . sulfamethoxazole-trimethoprim (BACTRIM DS,SEPTRA DS) 800-160 MG tablet Take 1 tablet by mouth 2 (two) times daily. 20 tablet 0   . traMADol (ULTRAM) 50 MG tablet Take 1 tablet (50 mg total) by mouth every 6 (six) hours as needed. 12 tablet 0   . traMADol (ULTRAM) 50 MG tablet Take 1 tablet (50 mg total) by mouth every 6 (six) hours as needed for moderate pain. 12 tablet 0      Patient Stressors: Financial difficulties Marital or family conflict  Patient Strengths: Average or above average intelligence Capable of independent living  Treatment Modalities: Medication Management, Group therapy, Case management,  1 to 1 session with clinician, Psychoeducation, Recreational therapy.   Physician Treatment Plan for Primary Diagnosis: Severe recurrent major depression with psychotic features (HCC) Long Term Goal(s): Improvement in symptoms so as ready for discharge Improvement in symptoms so as ready for discharge   Short Term Goals: Ability to disclose and discuss suicidal ideas Ability to identify and develop effective coping behaviors will improve Ability to identify and develop effective coping behaviors will improve Compliance with prescribed medications will improve Ability to identify triggers associated with substance abuse/mental health issues will improve  Medication Management: Evaluate patient's response, side effects, and tolerance of medication regimen.  Therapeutic Interventions: 1 to 1 sessions, Unit Group sessions and Medication administration.  Evaluation of Outcomes: Progressing  Physician Treatment Plan for Secondary Diagnosis: Principal Problem:   Severe recurrent major depression with psychotic features (HCC)  Long Term Goal(s): Improvement in symptoms so as ready for discharge Improvement in symptoms so as ready for discharge   Short Term Goals: Ability to disclose and discuss suicidal ideas Ability to identify and develop effective coping behaviors will improve Ability to identify and develop effective coping behaviors will improve Compliance with prescribed medications will improve Ability to identify triggers associated with substance abuse/mental health issues will improve     Medication Management: Evaluate patient's response, side effects, and tolerance of medication regimen.  Therapeutic Interventions: 1 to 1 sessions,  Unit Group sessions and Medication administration.  Evaluation of Outcomes: Progressing   RN Treatment Plan for Primary Diagnosis: Severe recurrent major depression with psychotic features (HCC) Long Term Goal(s): Knowledge of disease and therapeutic regimen to maintain health will improve  Short Term Goals: Ability to disclose and discuss suicidal ideas, Ability to identify and develop effective coping behaviors will improve and Compliance with prescribed medications will improve  Medication Management: RN will administer medications as ordered by provider, will assess and evaluate patient's response and provide education to patient for prescribed medication. RN will report any adverse and/or side effects to prescribing provider.  Therapeutic Interventions: 1 on 1 counseling sessions, Psychoeducation, Medication administration, Evaluate responses to treatment, Monitor vital signs and CBGs as ordered, Perform/monitor CIWA, COWS, AIMS and Fall Risk screenings as ordered, Perform wound care treatments as ordered.  Evaluation of Outcomes: Progressing   LCSW Treatment Plan for Primary Diagnosis: Severe recurrent major depression with psychotic features (HCC) Long Term Goal(s): Safe transition to appropriate  next level of care at discharge, Engage patient in therapeutic group addressing interpersonal concerns.  Short Term Goals: Engage patient in aftercare planning with referrals and resources and Increase social support  Therapeutic Interventions: Assess for all discharge needs, 1 to 1 time with Social worker, Explore available resources and support systems, Assess for adequacy in community support network, Educate family and significant other(s) on suicide prevention, Complete Psychosocial Assessment, Interpersonal group therapy.  Evaluation of Outcomes: Progressing   Progress in Treatment: Attending groups: No. Participating in groups: No. Taking medication as prescribed: Yes. Toleration  medication: Yes. Family/Significant other contact made: Yes, individual(s) contacted:  daughter Patient understands diagnosis: Yes. Discussing patient identified problems/goals with staff: Yes. Medical problems stabilized or resolved: Yes. Denies suicidal/homicidal ideation: Yes. Issues/concerns per patient self-inventory: No. Other: none  New problem(s) identified: No, Describe:  none  New Short Term/Long Term Goal(s):Pt goal: "get better, less suicidal thoughts."  Discharge Plan or Barriers: Pt will [facility] outpt [facility].  Reason for Continuation of Hospitalization: Depression Medication stabilization  Estimated Length of Stay: 3-5 days.  Attendees: Patient:Kelly [name] 07/12/2017   Physician: Dr. Johnella Moloney, MD 07/12/2017   Nursing: Hulan Amato, RN 07/12/2017   RN Care Manager: 07/12/2017   Social Worker: Daleen Squibb, Jake Shark LCSWs 07/12/2017   Recreational Therapist:  07/12/2017   Other:  07/12/2017   Other:  07/12/2017   Other: 07/12/2017        Scribe for Treatment Team: Lorri Frederick, LCSW 07/12/2017 2:34 PM

## 2017-07-12 NOTE — Progress Notes (Signed)
Casimira admitted to BMu she reports Depression and suicidal ideation. She reports wanting to overdose on some Tylenol, but she now agrees to come to staff if she has feeling of want to hurt herself or others. She was searched for contraband on admission to unit and none was found. She denies AH/VH at this time.   She reports her stressor is that her daughter moved back in to her home a few weeks ago and she is tired of her being there already. She also reports financial concerns and issues. She has been cooperative with treament and medication thus far. She appears to be in the bed resting quietly.

## 2017-07-12 NOTE — H&P (Signed)
Psychiatric Admission Assessment Adult  Patient Identification: Kelly [name] MRN:  [id number] Date of Evaluation:  07/12/2017 Chief Complaint:  Severe Major Depression Principal Diagnosis: Severe recurrent major depression with psychotic features [facility]) Diagnosis:   Patient Active Problem List   Diagnosis Date Noted  . Severe recurrent major depression with psychotic features (HCC) [F33.3] 07/11/2017  . Diabetes mellitus without complication (HCC) [E11.9] 07/11/2017  . Hypertension [I10] 07/11/2017   History of Present Illness: 46 yo female with history of depression presents due to worsening depression and suicidal thoughts with a plan to overdose on Tylenol. She states that she has struggled with depression for a long time but was doing relatively well. Her daughter moved in with her about a week and a half ago which has been a big stress on pt. She states that her daughter is 21 and brings her friends over all the time and they are very loud. She states that her and her daughter have been arguing all the time. She does not want to ask her daughter to leave because she does not have anywhere to go. t states that the started having thoughts of overdosing on a bottle of Tylenol at her house. She states that she did not actually attempt. When asked what stopped her, she states that her young grandson came in the room and that made her feel better. She states that she is still having suicidal thoughts today. She is on disability and gets $750 a month. She states that this is stressful when she has to pay her bills and does not have a lot of money. She states that she has trouble sleeping and wakes up a lot through the night. She does take a lot of naps through the day and feels very fatigued. She enjoys taking her grandson to the park and shopping and still enjoys doing these things. She has poor concentration and energy. She states that prior to her daughter moving in, she was doing well. She does  report having AH "last week" that were telling her to "go ahead and kill yourself." She has never heard this voice before and is not hearing it now. She is having difficulty discerning whether this was her own thoughts and voice or an outside voice. She states that she does look forward to seeing her grandson growing up. She states that she does not have much of a support system except and aunt in the area and she does not want me to contact her daughter at this time. She used to see a therapist and a psychiatrist but has been several years since she has seen anyone. She has not been compliant with her insulin because, "I don't know. I just stopped taking it." She has been showering and getting dressed daily and caring for her ADLs.   Pt reports vague history of manic symptoms. She states that about 2 weeks ago, she had a lot of energy, felt "really good" and "was cleaning my house a lot." She states that she was not sleeping a lot during this time. However, she is very vague with these symptoms and has never had any symptoms like this in the past. She is organized and goal directed. She does not appear manic or psychotic at this time.   Associated Signs/Symptoms: Depression Symptoms:  depressed mood, insomnia, fatigue, difficulty concentrating, hopelessness, suicidal thoughts with specific plan, (Hypo) Manic Symptoms:  Distractibility, Elevated Mood, Anxiety Symptoms:  Excessive Worry, Psychotic Symptoms:  Hallucinations: Auditory PTSD Symptoms: Negative Total Time  spent with patient: 45 minutes  Past Psychiatric History: Pt denies any psychiatric diagnosis. She used to see a psychiatrist and therapist in [place] but has been 2-3 years since she has seen anyone. She has not had any prior inpatient admissions. She reports 1 suicide attempts 10-15 years ago by cutting her wrists. She does not own or have access to guns or weapons. She does not recall any past medication trials other than Xanax and  has not taken this for years.   Is the patient at risk to self? Yes.    Has the patient been a risk to self in the past 6 months? Yes.    Has the patient been a risk to self within the distant past? Yes.    Is the patient a risk to others? No.  Has the patient been a risk to others in the past 6 months? No.  Has the patient been a risk to others within the distant past? No.   Alcohol Screening: 1. How often do you have a drink containing alcohol?: Never 9. Have you or someone else been injured as a result of your drinking?: No 10. Has a relative or friend or a doctor or another health worker been concerned about your drinking or suggested you cut down?: No Alcohol Use Disorder Identification Test Final Score (AUDIT): 0 Brief Intervention: AUDIT score less than 7 or less-screening does not suggest unhealthy drinking-brief intervention not indicated Substance Abuse History in the last 12 months:  Uses marijuana "every 8 months." Denies alcohol or other drug use.  Consequences of Substance Abuse: Negative Previous Psychotropic Medications: No  Psychological Evaluations: No  Past Medical History:  Past Medical History:  Diagnosis Date  . Diabetes mellitus without complication (HCC)   . Hypertension     Past Surgical History:  Procedure Laterality Date  . ABDOMINAL HYSTERECTOMY     Family History: History reviewed. No pertinent family history. Family Psychiatric  History: Unknown.  Tobacco Screening: Have you used any form of tobacco in the last 30 days? (Cigarettes, Smokeless Tobacco, Cigars, and/or Pipes): No Social History:  Additional Social History: Marital status: Single Are you sexually active?: Yes What is your sexual orientation?: heterosexual Has your sexual activity been affected by drugs, alcohol, medication, or emotional stress?: no Does patient have children?: Yes How many children?: 1 How is patient's relationship with their children?: 56 year old daughter has  recently moved home and there is some conflict due to her using alcohol and marijuana.     Allergies:  No Known Allergies Lab Results:  Results for orders placed or performed during the hospital encounter of 07/11/17 (from the past 48 hour(s))  Glucose, capillary     Status: Abnormal   Collection Time: 07/12/17  7:16 AM  Result Value Ref Range   Glucose-Capillary 371 (H) 65 - 99 mg/dL   Comment 1 Notify RN   Lipid panel     Status: Abnormal   Collection Time: 07/12/17  7:20 AM  Result Value Ref Range   Cholesterol 143 0 - 200 mg/dL   Triglycerides 161 <096 mg/dL   HDL 35 (L) >04 mg/dL   Total CHOL/HDL Ratio 4.1 RATIO   VLDL 24 0 - 40 mg/dL   LDL Cholesterol 84 0 - 99 mg/dL    Comment:        Total Cholesterol/HDL:CHD Risk Coronary Heart Disease Risk Table  Men   Women  1/2 Average Risk   3.4   3.3  Average Risk       5.0   4.4  2 X Average Risk   9.6   7.1  3 X Average Risk  23.4   11.0        Use the calculated Patient Ratio above and the CHD Risk Table to determine the patient's CHD Risk.        ATP III CLASSIFICATION (LDL):  <100     mg/dL   Optimal  161-096  mg/dL   Near or Above                    Optimal  130-159  mg/dL   Borderline  045-409  mg/dL   High  >811     mg/dL   Very High   TSH     Status: None   Collection Time: 07/12/17  7:20 AM  Result Value Ref Range   TSH 1.446 0.350 - 4.500 uIU/mL    Comment: Performed by a 3rd Generation assay with a functional sensitivity of <=0.01 uIU/mL.    Blood Alcohol level:  Lab Results  Component Value Date   ETH <10 07/11/2017    Metabolic Disorder Labs:  No results found for: HGBA1C, MPG No results found for: PROLACTIN Lab Results  Component Value Date   CHOL 143 07/12/2017   TRIG 121 07/12/2017   HDL 35 (L) 07/12/2017   CHOLHDL 4.1 07/12/2017   VLDL 24 07/12/2017   LDLCALC 84 07/12/2017    Current Medications: Current Facility-Administered Medications  Medication Dose Route  Frequency Provider Last Rate Last Dose  . acetaminophen (TYLENOL) tablet 650 mg  650 mg Oral Q6H PRN Clapacs, John T, MD      . alum & mag hydroxide-simeth (MAALOX/MYLANTA) 200-200-20 MG/5ML suspension 30 mL  30 mL Oral Q4H PRN Clapacs, John T, MD      . FLUoxetine (PROZAC) capsule 20 mg  20 mg Oral Daily Clapacs, John T, MD   20 mg at 07/12/17 0810  . hydrochlorothiazide (HYDRODIURIL) tablet 25 mg  25 mg Oral Daily Clapacs, Jackquline Denmark, MD   25 mg at 07/12/17 0809  . insulin aspart (novoLOG) injection 0-15 Units  0-15 Units Subcutaneous TID WC Clapacs, Jackquline Denmark, MD   15 Units at 07/12/17 [phone]  . lisinopril (PRINIVIL,ZESTRIL) tablet 40 mg  40 mg Oral Daily Clapacs, John T, MD      . magnesium hydroxide (MILK OF MAGNESIA) suspension 30 mL  30 mL Oral Daily PRN Clapacs, John T, MD      . metFORMIN (GLUCOPHAGE) tablet 500 mg  500 mg Oral BID WC Clapacs, Jackquline Denmark, MD   500 mg at 07/12/17 0810  . traZODone (DESYREL) tablet 50 mg  50 mg Oral QHS PRN Anamika Kueker, Ileene Hutchinson, MD       PTA Medications: Prescriptions Prior to Admission  Medication Sig Dispense Refill Last Dose  . cephALEXin (KEFLEX) 500 MG capsule Take 1 capsule (500 mg total) by mouth 3 (three) times daily. 30 capsule 0   . cyclobenzaprine (FLEXERIL) 10 MG tablet Take 1 tablet (10 mg total) by mouth every 8 (eight) hours as needed for muscle spasms. 30 tablet 1   . diazepam (VALIUM) 2 MG tablet Take 1 tablet (2 mg total) by mouth every 6 (six) hours as needed for anxiety. 12 tablet 0   . guaiFENesin-codeine 100-10 MG/5ML syrup Take 5 mLs by mouth every 6 (six)  hours as needed for cough. 120 mL 0   . hydrochlorothiazide (HYDRODIURIL) 25 MG tablet Take 25 mg by mouth daily.     [name]. HYDROcodone-acetaminophen (NORCO/VICODIN) 5-325 MG tablet Take 1 tablet by mouth every 4 (four) hours as needed for moderate pain. 20 tablet 0   . ibuprofen (ADVIL,MOTRIN) 600 MG tablet Take 1 tablet (600 mg total) by mouth every 8 (eight) hours as needed. 15 tablet 0   . ibuprofen  (ADVIL,MOTRIN) 800 MG tablet Take 1 tablet (800 mg total) by mouth every 8 (eight) hours as needed. 30 tablet 0   . insulin detemir (LEVEMIR) 100 UNIT/ML injection Inject into the skin at bedtime.     . lidocaine (LIDODERM) 5 % Place 1 patch onto the skin every 12 (twelve) hours. Remove & Discard patch within 12 hours or as directed by MD 10 patch 0   . lisinopril (PRINIVIL,ZESTRIL) 40 MG tablet Take 40 mg by mouth daily.     . metFORMIN (GLUCOPHAGE) 500 MG tablet Take by mouth 2 (two) times daily with a meal.     . oxyCODONE-acetaminophen (ROXICET) 5-325 MG tablet Take 1-2 tablets by mouth every 4 (four) hours as needed for severe pain. 15 tablet 0   . phenazopyridine (PYRIDIUM) 200 MG tablet Take 1 tablet (200 mg total) by mouth 3 (three) times daily as needed for pain. 20 tablet 0   . sulfamethoxazole-trimethoprim (BACTRIM DS,SEPTRA DS) 800-160 MG tablet Take 1 tablet by mouth 2 (two) times daily. 20 tablet 0   . traMADol (ULTRAM) 50 MG tablet Take 1 tablet (50 mg total) by mouth every 6 (six) hours as needed. 12 tablet 0   . traMADol (ULTRAM) 50 MG tablet Take 1 tablet (50 mg total) by mouth every 6 (six) hours as needed for moderate pain. 12 tablet 0     Musculoskeletal: Strength & Muscle Tone: within normal limits Gait & Station: normal Patient leans: N/A  Psychiatric Specialty Exam: Physical Exam  ROS  Blood pressure 95/62, pulse 90, temperature 98.6 F (37 C), temperature source Oral, resp. rate 18, height 5\' 4"  (1.626 m), weight 85.3 kg (188 lb), SpO2 100 %.Body mass index is 32.27 kg/m.  General Appearance: Disheveled  Eye Contact:  Fair  Speech:  Slow  Volume:  Decreased  Mood:  Depressed  Affect:  Constricted  Thought Process:  Goal Directed  Orientation:  Full (Time, Place, and Person)  Thought Content:  Hallucinations: Auditory  Suicidal Thoughts:  Yes.  with intent/plan  Homicidal Thoughts:  No  Memory:  Immediate;   Fair  Judgement:  Fair  Insight:  Fair   Psychomotor Activity:  Psychomotor Retardation  Concentration:  Concentration: Poor  Recall:  [employer]Fair  Fund of Knowledge:  Fair  Language:  Fair  Akathisia:  No  \    Assets:  Desire for Improvement Housing Resilience  ADL's:  Intact  Cognition:  WNL  Sleep:  Number of Hours: 6.25    Treatment Plan Summary: 46 yo female who presents due to acute worsening of depression and SI with a plan to overdose on Tylenol. Mood has been worsening over the past 2 weeks due to her daughter moving in with her and stress with that. SI became very strong until her grandson came in the room and that is what stopped her. She brought herself in for help. She does not have any outpatient providers and would greatly benefit from therapy. She is meeting with Lorella NimrodHarvey to get established with [facility].   Plan:  1. Depression. Prozac started in ED. Will add Risperdal 0.5 mg qhs for depression argumentation, vague history of manic symptoms, and AH.  2. Insomnia. Trazodone prn.  3. UTI. Start Bactrim for 7 days.  4. Diabetes. Diabetes care coordinator to put in recommendations for insulin.   5. Dispo/Social: She lives in a home that she owns with her daughter. She identifies and aunt who is a support. Pt is meeting with Lorella Nimrod with peer support to get her established with [facility].   Observation Level/Precautions:  15 minute checks  Laboratory:  Done in ED  Psychotherapy:    Medications:    Consultations:    Discharge Concerns:    Estimated LOS: 5-7 days  Other:     Physician Treatment Plan for Primary Diagnosis: Severe recurrent major depression with psychotic features (HCC) Long Term Goal(s): Improvement in symptoms so as ready for discharge  Short Term Goals: Ability to disclose and discuss suicidal ideas and Ability to identify and develop effective coping behaviors will improve  Physician Treatment Plan for Secondary Diagnosis: Active Problems:   Severe recurrent major depression with psychotic features  (HCC)  Long Term Goal(s): Improvement in symptoms so as ready for discharge  Short Term Goals: Ability to identify and develop effective coping behaviors will improve, Compliance with prescribed medications will improve and Ability to identify triggers associated with substance abuse/mental health issues will improve  I certify that inpatient services furnished can reasonably be expected to improve the patient's condition.    Haskell Riling, MD 10/24/201812:04 PM

## 2017-07-12 NOTE — [facility] Group Notes (Signed)
LCSW Group Therapy Note  07/12/2017 9:30am  Type of Therapy/Topic:  Group Therapy:  Emotion Regulation  Participation Level:  Did Not Attend   Description of Group:   The purpose of this group is to assist patients in learning to regulate negative emotions and experience positive emotions. Patients will be guided to discuss ways in which they have been vulnerable to their negative emotions. These vulnerabilities will be juxtaposed with experiences of positive emotions or situations, and patients will be challenged to use positive emotions to combat negative ones. Special emphasis will be placed on coping with negative emotions in conflict situations, and patients will process healthy conflict resolution skills.  Therapeutic Goals: 1. Patient will identify two positive emotions or experiences to reflect on in order to balance out negative emotions 2. Patient will label two or more emotions that they find the most difficult to experience 3. Patient will demonstrate positive conflict resolution skills through discussion and/or role plays  Summary of Patient Progress:       Therapeutic Modalities:   Cognitive Behavioral Therapy Feelings Identification Dialectical Behavioral Therapy   Glennon MacSara P Thierno Hun, LCSW 07/12/2017 5:52 PM

## 2017-07-12 NOTE — [facility] Suicide Risk Assessment (Signed)
[facility] INPATIENT:  Family/Significant Other Suicide Prevention Education  Suicide Prevention Education:  Education Completed; Kelly [name], daughter, [phone], has been identified by the patient as the family member/significant other with whom the patient will be residing, and identified as the person(s) who will aid the patient in the event of a mental health crisis (suicidal ideations/suicide attempt).  With written consent from the patient, the family member/significant other has been provided the following suicide prevention education, prior to the and/or following the discharge of the patient.  The suicide prevention education provided includes the following:  Suicide risk factors  Suicide prevention and interventions  National Suicide Hotline telephone number  [facility] assessment telephone number  [facility] Emergency Assistance 911  [facility] and/or [facility] Mobile Crisis Unit telephone number  Request made of family/significant other to:  Remove weapons (e.g., guns, rifles, knives), all items previously/currently identified as safety concern.  No guns in the home, per Kelly.  Remove drugs/medications (over-the-counter, prescriptions, illicit drugs), all items previously/currently identified as a safety concern. Kelly reports she has children in the home and she makes sure medication is not unsecured.  The family member/significant other verbalizes understanding of the suicide prevention education information provided.  The family member/significant other agrees to remove the items of safety concern listed above.  Kelly reports that her mother does not talk to her a lot and she was not aware that she was in crisis.  Kelly agreed to check in with her moving forward to make sure she is doing OK.  Kelly [name], Kelly Krach Jon, LCSW 07/12/2017, 11:52 AM

## 2017-07-12 NOTE — Plan of Care (Signed)
Problem: Education: Goal: Utilization of techniques to improve thought processes will improve Outcome: Progressing Attending  Unit programing  Goal: Knowledge of the prescribed therapeutic regimen will improve Outcome: Progressing Informed of  medication  Problem: Coping: Goal: Ability to cope will improve Outcome: Progressing Working on coping f skills

## 2017-07-12 NOTE — Tx Team (Signed)
Initial Treatment Plan 07/12/2017 3:35 AM Kelly [name] ZOX:[id number]    PATIENT STRESSORS: Financial difficulties Marital or family conflict   PATIENT STRENGTHS: Average or above average intelligence Capable of independent living   PATIENT IDENTIFIED PROBLEMS: Depression  Family conflict                   DISCHARGE CRITERIA:  Ability to meet basic life and health needs Improved stabilization in mood, thinking, and/or behavior  PRELIMINARY DISCHARGE PLAN: Outpatient therapy  PATIENT/FAMILY INVOLVEMENT: This treatment plan has been presented to and reviewed with the patient, Kelly [name].  The patient and family have been given the opportunity to ask questions and make suggestions.  Elbert EwingsMonica A Adalay Azucena, RN 07/12/2017, 3:35 AM
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
## 2017-07-12 NOTE — Progress Notes (Signed)
D: D: Patient stated slept fair last night .Stated appetite poor and energy level  Is normal. Stated concentration is good . Stated on Depression scale ,9  Hopeless 9 and  9  Anxiety 9 .( low 0-10 high) Denies suicidal  homicidal ideations  .  No auditory hallucinations  No pain concerns . Appropriate ADL'S. Interacting with peers and staff. Affect sad and depressed   Limited interactions . Voice of stressors  With health fiances her daugther. Patient given booklet for the unit . And handout for  Coping skills  A: Encourage patient participation with unit programming . Instruction  Given on  Medication , verbalize understanding. R: Voice no other concerns. Staff continue to monitor

## 2017-07-12 NOTE — [facility] Group Notes (Signed)
[facility] Group Notes:  (Nursing/MHT/Case Management/Adjunct)  Date:  07/12/2017  Time:  4:55 PM  Type of Therapy:  Psychoeducational Skills  Participation Level:  Active  Participation Quality:  Appropriate  Affect:  Appropriate  Cognitive:  Appropriate  Insight:  Appropriate  Engagement in Group:  Engaged  Modes of Intervention:  Discussion, Education, Socialization and Support  Summary of Progress/Problems:  Kelly SmokeCara [name] Kelly [name] 07/12/2017, 4:55 PM

## 2017-07-12 NOTE — Progress Notes (Signed)
Inpatient Diabetes Program Recommendations  AACE/ADA: New Consensus Statement on Inpatient Glycemic Control (2015)  Target Ranges:  Prepandial:   less than 140 mg/dL      Peak postprandial:   less than 180 mg/dL (1-2 hours)      Critically ill patients:  140 - 180 mg/dL   Results for Kelly [name], Kelly [name] (MRN [id number]) as of 07/12/2017 09:47  Ref. Range 07/11/2017 19:18  Glucose-Capillary Latest Ref Range: 65 - 99 mg/dL 782304 (H)  Results for Kelly [name], Kelly [name] (MRN [id number]) as of 07/12/2017 09:47  Ref. Range 07/11/2017 16:27  Glucose Latest Ref Range: 65 - 99 mg/dL 578311 (H)   Review of Glycemic Control  Diabetes history: DM2 Outpatient Diabetes medications: Levemir QHS (no dose noted in chart and patient is not sure), Metformin 500 mg BID Current orders for Inpatient glycemic control: Novolog 0-15 units TID with meals, Metformin 500 mg BID  Inpatient Diabetes Program Recommendations: Insulin - Basal: Please consider ordering Lantus 17 units daily (starting now; based on 85.6 kg x 0.2 units). Correction (SSI): Please consider ordering Novolog 0-5 units QHS for bedtime correction scale. HgbA1C: A1C in process. Oral DM medications: Since patient reports diarrhea with Metformin, may want to consider ordering Metformin XR 500 mg BID to see if patient is able to tolerate extended release version better.  NOTE: Chart reviewed. Noted patient has [name] history of DM2 and has Levemir and Metformin on home medication list. Briefly spoke with Dr. Johnella MoloneyMcNew and patient reports that she had stopped taking all medications and she is not sure how much Levemir she was taking and she does not like to take Metformin due to diarrhea. Initial glucose 311 mg/dl on 46/96/2910/23/18. Would recommend using Lantus instead of Levemir so that hopefully patient will only need once [name] day basal insulin ordered as Lantus will usually last for 24 hours. If patient is willing to try Metformin XR, she may be able to tolerate the extended  release version better with less GI side effects. Recommend ordering bedtime Novolog correction scale also. Current A1C has been ordered. Will continue to follow along and make further recommendations as more data is collected.  Thanks, Orlando PennerMarie Jaydi Bray, RN, MSN, CDE Diabetes Coordinator Inpatient Diabetes Program [phone] (Team Pager from 8am to 5pm)

## 2017-07-12 NOTE — [facility] Suicide Risk Assessment (Signed)
[facility]   Nursing information obtained from:   Chart, Patient Demographic factors:   African American female Current Mental Status:   Depressed with suiicdal thoughts with a plan Loss Factors:   n/a Historical Factors:    Risk Reduction Factors:     Total Time spent with patient: 45 minutes Principal Problem: Severe recurrent major depression with psychotic features [facility](HCC) Diagnosis:   Patient Active Problem List   Diagnosis Date Noted  . Severe recurrent major depression with psychotic features (HCC) [F33.3] 07/11/2017  . Diabetes mellitus without complication (HCC) [E11.9] 07/11/2017  . Hypertension [I10] 07/11/2017   Subjective Data: Depressed and suicidal with a plan to overdose on Tylenol related to stress with her daughter moving in.   Continued Clinical Symptoms:  Alcohol Use Disorder Identification Test Final Score (AUDIT): 0 The "Alcohol Use Disorders Identification Test", Guidelines for Use in Primary Care, Second Edition.  World Science writerHealth Organization [facility](WHO). Score between 0-7:  no or low risk or alcohol related problems. Score between 8-15:  moderate risk of alcohol related problems. Score between 16-19:  high risk of alcohol related problems. Score 20 or above:  warrants further diagnostic evaluation for alcohol dependence and treatment.   CLINICAL FACTORS:   Depression:   Hopelessness   Musculoskeletal: Strength & Muscle Tone: within normal limits Gait & Station: normal Patient leans: N/A  Psychiatric Specialty Exam: Physical Exam  ROS  Blood pressure 95/62, pulse 90, temperature 98.6 F (37 C), temperature source Oral, resp. rate 18, height 5\' 4"  (1.626 m), weight 85.3 kg (188 lb), SpO2 100 %.Body mass index is 32.27 kg/m.     General Appearance: Disheveled  Eye Contact:  Fair  Speech:  Slow  Volume:  Decreased  Mood:  Depressed  Affect:  Constricted  Thought Process:  Goal Directed  Orientation:  Full (Time, Place, and  Person)  Thought Content:  Hallucinations: Auditory  Suicidal Thoughts:  Yes.  with intent/plan  Homicidal Thoughts:  No  Memory:  Immediate;   Fair  Judgement:  Fair  Insight:  Fair  Psychomotor Activity:  Psychomotor Retardation  Concentration:  Concentration: Poor  Recall:  [employer]  Fund of Knowledge:  Fair  Language:  Fair  Akathisia:  No  \    Assets:  Desire for Improvement Housing Resilience  ADL's:  Intact  Cognition:  WNL  Sleep:  Number of Hours: 6.25      COGNITIVE FEATURES THAT CONTRIBUTE TO RISK:  Thought constriction (tunnel vision)    SUICIDE RISK:   Severe:  Frequent, intense, and enduring suicidal ideation, specific plan, no subjective intent, but some objective markers of intent (i.e., choice of lethal method), the method is accessible, some limited preparatory behavior, evidence of impaired self-control, severe dysphoria/symptomatology, multiple risk factors present, and few if any protective factors, particularly a lack of social support.  PLAN OF CARE:  -Start Prozac 20 mg and Risperdal 0.5 mg qhs  I certify that inpatient services furnished can reasonably be expected to improve the patient's condition.   Haskell RilingHolly R McNew, MD 07/12/2017, 12:27 PM

## 2017-07-13 DIAGNOSIS — F333 Major depressive disorder, recurrent, severe with psychotic symptoms: Principal | ICD-10-CM

## 2017-07-13 LAB — GLUCOSE, CAPILLARY
GLUCOSE-CAPILLARY: 256 mg/dL — AB (ref 65–99)
GLUCOSE-CAPILLARY: 186 mg/dL — AB (ref 65–99)
GLUCOSE-CAPILLARY: 236 mg/dL — AB (ref 65–99)
Glucose-Capillary: 242 mg/dL — ABNORMAL HIGH (ref 65–99)

## 2017-07-13 MED ORDER — RISPERIDONE 1 MG PO TABS
0.5000 mg | ORAL_TABLET | Freq: Every day | ORAL | Status: DC
  Administered 2017-07-13 – 2017-07-17 (×5): 0.5 mg via ORAL
  Filled 2017-07-13 (×5): qty 1

## 2017-07-13 MED ORDER — INSULIN GLARGINE 100 UNIT/ML ~~LOC~~ SOLN
21.0000 [IU] | Freq: Every day | SUBCUTANEOUS | Status: DC
  Administered 2017-07-13: 21 [IU] via SUBCUTANEOUS
  Filled 2017-07-13 (×2): qty 0.21

## 2017-07-13 NOTE — Plan of Care (Signed)
Problem: Education: Goal: Knowledge of the prescribed therapeutic regimen will improve Outcome: Progressing Kelly [name] is attending the group therapy sessions.  Problem: Coping: Goal: Ability to cope will improve Outcome: Progressing Affect is somewhat brighter, attending the group therapy sessions .

## 2017-07-13 NOTE — [facility] Group Notes (Signed)
B[facility] LCSW Group Therapy Note  Date/Time: 07/13/17, 0930  Type of Therapy/Topic:  Group Therapy:  Balance in Life  Participation Level:  active Description of Group:    This group will address the concept of balance and how it feels and looks when one is unbalanced. Patients will be encouraged to process areas in their lives that are out of balance, and identify reasons for remaining unbalanced. Facilitators will guide patients utilizing problem- solving interventions to address and correct the stressor making their life unbalanced. Understanding and applying boundaries will be explored and addressed for obtaining  and maintaining a balanced life. Patients will be encouraged to explore ways to assertively make their unbalanced needs known to significant others in their lives, using other group members and facilitator for support and feedback.  Therapeutic Goals: 1. Patient will identify two or more emotions or situations they have that consume much of in their lives. 2. Patient will identify signs/triggers that life has become out of balance:  3. Patient will identify two ways to set boundaries in order to achieve balance in their lives:  4. Patient will demonstrate ability to communicate their needs through discussion and/or role plays  Summary of Patient Progress:Pt identified physical, mental, family, and finances as areas that are out of balance in her life.  Pt took part in discussion regarding ways to recognize and respond to different areas being out of balance.          Therapeutic Modalities:   Cognitive Behavioral Therapy Solution-Focused Therapy Assertiveness Training  Daleen SquibbGreg Corinda Ammon, [place]CSW

## 2017-07-13 NOTE — [facility] Group Notes (Signed)
[facility] Group Notes:  (Nursing/MHT/Case Management/Adjunct)  Date:  07/13/2017  Time:  2:01 PM  Type of Therapy:  Psychoeducational Skills  Participation Level:  Active  Participation Quality:  Appropriate  Affect:  Appropriate  Cognitive:  Appropriate  Insight:  Appropriate  Engagement in Group:  Engaged  Modes of Intervention:  Discussion, Education, Socialization and Support  Summary of Progress/Problems:  Kelly SmokeCara [name] Kelly [name] 07/13/2017, 2:01 PM

## 2017-07-13 NOTE — Progress Notes (Signed)
Inpatient Diabetes Program Recommendations  AACE/ADA: New Consensus Statement on Inpatient Glycemic Control (2015)  Target Ranges:  Prepandial:   less than 140 mg/dL      Peak postprandial:   less than 180 mg/dL (1-2 hours)      Critically ill patients:  140 - 180 mg/dL   Lab Results  Component Value Date   GLUCAP 242 (H) 07/13/2017   HGBA1C 11.4 (H) 07/12/2017    Review of Glycemic Control  Results for Kelly OxfordBENTON, Shaquasia A (MRN [id number]) as of 07/13/2017 08:36  Ref. Range 07/12/2017 07:16 07/12/2017 11:42 07/12/2017 16:53 07/12/2017 20:49 07/13/2017 07:04  Glucose-Capillary Latest Ref Range: 65 - 99 mg/dL 409371 (H) 811224 (H) 914143 (H) 223 (H) 242 (H)    Diabetes history: DM2 Outpatient Diabetes medications: Levemir QHS (no dose noted in chart and patient is not sure), Metformin 500 mg BID  Current orders for Inpatient glycemic control: Novolog 0-15 units TID with meals, Metformin 500 mg BID, Novolog 0-5 units qhs, Lantus 17 units qhs  Inpatient Diabetes Program Recommendations:Consider increasing Lantus to 21 units qhs (0.25units/kg)- fasting blood sugar 242mg /dl.    Susette RacerJulie Yusuf Yu, RN, BA, MHA, CDE Diabetes Coordinator Inpatient Diabetes Program  [phone] (Team Pager) [phone] [facility] Office) 07/13/2017 8:39 AM

## 2017-07-13 NOTE — Progress Notes (Signed)
Received Kelly [name] this am, compliant with her BS checks and insulin coverage. She received her medications, attending the group therapy sessions and verbalized feeling less anxious. She remained OOB in the milieu at intervals. She refused the metformin because it upsets her stomach.

## 2017-07-13 NOTE — Progress Notes (Signed)
[facility] MD Progress Note  07/13/2017 2:52 PM Kelly [name]  MRN:  [id number] Subjective:  Pt states that she is feeling slightly better today. She states that she "loves" the groups and have been extremely helpful for her. She states that she does still have suicidal thoughts but are improving slightly. She states that she did talk with her daughter yesterday and they had a good conversation about things that need to change at home. She states that her daughter is willing to work on respecting her boundaries and pt states that she is going to learn to deal with having her daughter in the house and they will work together on these things. She also talked to her mother and aunt who are very supportive. Pt denies AH and states that these have improved. She also slept better last night and was not too groggy today.   She did give me permission to talk to her mother, Kelly [name] at [phone] as I like to reach out to support network. Her mother did not know why she came to the hospital and I ddi not disclose details on this without pt's permission. She states that she is going to come visit her daughter today.  Principal Problem: Severe recurrent major depression with psychotic features [facility]) Diagnosis:   Patient Active Problem List   Diagnosis Date Noted  . Severe recurrent major depression with psychotic features ([place]) [F33.3] 07/11/2017  . Diabetes mellitus without complication ([place]) [Y81.4] 07/11/2017  . Hypertension [I10] 07/11/2017   Total Time spent with patient: 20 minutes  Past Psychiatric History:See H&P  Past Medical History:  Past Medical History:  Diagnosis Date  . Diabetes mellitus without complication ([place])   . Hypertension     Past Surgical History:  Procedure Laterality Date  . ABDOMINAL HYSTERECTOMY     Family History: History reviewed. No pertinent family history. Family Psychiatric  History: See H&P Social History:  History  Alcohol Use No     History  Drug Use No     Social History   Social History  . Marital status: Single    Spouse name: N/A  . Number of children: N/A  . Years of education: N/A   Social History Main Topics  . Smoking status: Never Smoker  . Smokeless tobacco: Never Used  . Alcohol use No  . Drug use: No  . Sexual activity: Not Asked   Other Topics Concern  . None   Social History Narrative  . None    Sleep: Improving  Appetite:  Fair  Current Medications: Current Facility-Administered Medications  Medication Dose Route Frequency Provider Last Rate Last Dose  . acetaminophen (TYLENOL) tablet 650 mg  650 mg Oral Q6H PRN Clapacs, John T, MD      . alum & mag hydroxide-simeth (MAALOX/MYLANTA) 200-200-20 MG/5ML suspension 30 mL  30 mL Oral Q4H PRN Clapacs, John T, MD      . FLUoxetine (PROZAC) capsule 20 mg  20 mg Oral Daily Clapacs, John T, MD   20 mg at 07/13/17 4818  . hydrochlorothiazide (HYDRODIURIL) tablet 25 mg  25 mg Oral Daily Clapacs, Madie Reno, MD   25 mg at 07/13/17 5631  . insulin aspart (novoLOG) injection 0-15 Units  0-15 Units Subcutaneous TID WC Clapacs, Madie Reno, MD   5 Units at 07/13/17 0751  . insulin aspart (novoLOG) injection 0-5 Units  0-5 Units Subcutaneous QHS Marylin Crosby, MD   5 Units at 07/12/17 2154  . insulin glargine (LANTUS) injection 21 Units  21 Units Subcutaneous QHS Avett Reineck R, MD      . lisinopril (PRINIVIL,ZESTRIL) tablet 40 mg  40 mg Oral Daily Clapacs, Madie Reno, MD   40 mg at 07/13/17 7628  . magnesium hydroxide (MILK OF MAGNESIA) suspension 30 mL  30 mL Oral Daily PRN Clapacs, John T, MD      . metFORMIN (GLUCOPHAGE-XR) 24 hr tablet 500 mg  500 mg Oral BID WC Marlane Hirschmann R, MD      . risperiDONE (RISPERDAL) tablet 0.5 mg  0.5 mg Oral QHS Renada Cronin R, MD      . sulfamethoxazole-trimethoprim (BACTRIM DS,SEPTRA DS) 800-160 MG per tablet 1 tablet  1 tablet Oral Q12H Obert Espindola, Tyson Babinski, MD   1 tablet at 07/13/17 0933  . traZODone (DESYREL) tablet 50 mg  50 mg Oral QHS PRN Bralynn Donado, Tyson Babinski,  MD        Lab Results:  Results for orders placed or performed during the hospital encounter of 07/11/17 (from the past 48 hour(s))  Glucose, capillary     Status: Abnormal   Collection Time: 07/12/17  7:16 AM  Result Value Ref Range   Glucose-Capillary 371 (H) 65 - 99 mg/dL   Comment 1 Notify RN   Hemoglobin A1c     Status: Abnormal   Collection Time: 07/12/17  7:20 AM  Result Value Ref Range   Hgb A1c MFr Bld 11.4 (H) 4.8 - 5.6 %    Comment: (NOTE) Pre diabetes:          5.7%-6.4% Diabetes:              >6.4% Glycemic control for   <7.0% adults with diabetes    Mean Plasma Glucose 280.48 mg/dL    Comment: Performed at [facility] [address], [place], [place] [address]  Lipid panel     Status: Abnormal   Collection Time: 07/12/17  7:20 AM  Result Value Ref Range   Cholesterol 143 0 - 200 mg/dL   Triglycerides 121 <150 mg/dL   HDL 35 (L) >40 mg/dL   Total CHOL/HDL Ratio 4.1 RATIO   VLDL 24 0 - 40 mg/dL   LDL Cholesterol 84 0 - 99 mg/dL    Comment:        Total Cholesterol/HDL:CHD Risk Coronary Heart Disease Risk Table                     Men   Women  1/2 Average Risk   3.4   3.3  Average Risk       5.0   4.4  2 X Average Risk   9.6   7.1  3 X Average Risk  23.4   11.0        Use the calculated Patient Ratio above and the CHD Risk Table to determine the patient's CHD Risk.        ATP III CLASSIFICATION (LDL):  <100     mg/dL   Optimal  100-129  mg/dL   Near or Above                    Optimal  130-159  mg/dL   Borderline  160-189  mg/dL   High  >190     mg/dL   Very High   TSH     Status: None   Collection Time: 07/12/17  7:20 AM  Result Value Ref Range   TSH 1.446 0.350 - 4.500 uIU/mL    Comment:  Performed by a 3rd Generation assay with a functional sensitivity of <=0.01 uIU/mL.  Glucose, capillary     Status: Abnormal   Collection Time: 07/12/17 11:42 AM  Result Value Ref Range   Glucose-Capillary 224 (H) 65 - 99 mg/dL   Comment 1 Notify RN    Glucose, capillary     Status: Abnormal   Collection Time: 07/12/17  4:53 PM  Result Value Ref Range   Glucose-Capillary 143 (H) 65 - 99 mg/dL  Glucose, capillary     Status: Abnormal   Collection Time: 07/12/17  8:49 PM  Result Value Ref Range   Glucose-Capillary 223 (H) 65 - 99 mg/dL  Glucose, capillary     Status: Abnormal   Collection Time: 07/13/17  7:04 AM  Result Value Ref Range   Glucose-Capillary 242 (H) 65 - 99 mg/dL   Comment 1 Notify RN   Glucose, capillary     Status: Abnormal   Collection Time: 07/13/17 11:48 AM  Result Value Ref Range   Glucose-Capillary 256 (H) 65 - 99 mg/dL   Comment 1 Notify RN     Blood Alcohol level:  Lab Results  Component Value Date   ETH <10 53/66/4403    Metabolic Disorder Labs: Lab Results  Component Value Date   HGBA1C 11.4 (H) 07/12/2017   MPG 280.48 07/12/2017   MPG 283.35 07/11/2017   No results found for: PROLACTIN Lab Results  Component Value Date   CHOL 143 07/12/2017   TRIG 121 07/12/2017   HDL 35 (L) 07/12/2017   CHOLHDL 4.1 07/12/2017   VLDL 24 07/12/2017   LDLCALC 84 07/12/2017    Physical Findings: AIMS:  , ,  ,  ,    CIWA:    COWS:     Musculoskeletal: Strength & Muscle Tone: within normal limits Gait & Station: normal Patient leans: N/A  Psychiatric Specialty Exam: Physical Exam  Nursing note and vitals reviewed.   ROS  Blood pressure 124/82, pulse 81, temperature 98.4 F (36.9 C), temperature source Oral, resp. rate 18, height _0  (1.626 m), weight 85.3 kg (188 lb), SpO2 100 %.Body mass index is 32.27 kg/m.  General Appearance: Disheveled  Eye Contact:  Fair  Speech:  Clear and Coherent  Volume:  Normal  Mood:  Depressed  Affect:  Constricted  Thought Process:  Goal Directed  Orientation:  Full (Time, Place, and Person)  Thought Content:  Negative  Suicidal Thoughts:  Yes.  without intent/plan  Homicidal Thoughts:  No  Memory:  Immediate;   Fair  Judgement:  Fair  Insight:  Fair   Psychomotor Activity:  Negative  Concentration:  Concentration: Fair  Recall:  [employer] of Knowledge:  Fair  Language:  Fair  Akathisia:  No      Assets:  Communication Skills Desire for Improvement Housing Social Support  ADL's:  Intact  Cognition:  WNL  Sleep:  Number of Hours: 7     Treatment Plan Summary: 46 yo female with history of depression presents due to SI with plan to overdose. Pt has been participating in groups and really getting a lot out of them. She is showing brighter affect today and states that she is starting to feel better although she still has SI today. She would greatly benefit from continued therapy as an outpatient and has met with Lanae Boast with peer support regarding this.  Plan:  1. Depression. Continue Prozac 20 mg daily.  Continue Risperdal 0.5 mg daily for depression augmentation, AH, and vague  history of manic symptoms.   2. Insomnia. -Trazodone prn  3. UTI -Started Bactrim yesterday  4. Diabetes Type II. Diabetes care coordinator on board and making adjustments. Recommended to increase Lantus to 21 units tonight. Appreciate their recommendations  5. Dispo/Social: Pt owns her own home and will return there upon discharge. Her daughter lives with her. Her main support is her mother who I contacted today. Will also coordinate care with her daughter Kelly [name] upon discharge (she is hesitant for me to contact her at this time) [phone]  Marylin Crosby, MD 07/13/2017, 2:52 PM

## 2017-07-14 LAB — GLUCOSE, CAPILLARY
GLUCOSE-CAPILLARY: 255 mg/dL — AB (ref 65–99)
GLUCOSE-CAPILLARY: 256 mg/dL — AB (ref 65–99)
GLUCOSE-CAPILLARY: 195 mg/dL — AB (ref 65–99)
GLUCOSE-CAPILLARY: 275 mg/dL — AB (ref 65–99)

## 2017-07-14 MED ORDER — INSULIN GLARGINE 100 UNIT/ML ~~LOC~~ SOLN
26.0000 [IU] | Freq: Every day | SUBCUTANEOUS | Status: DC
  Administered 2017-07-14 – 2017-07-16 (×3): 26 [IU] via SUBCUTANEOUS
  Filled 2017-07-14 (×4): qty 0.26

## 2017-07-14 MED ORDER — IBUPROFEN 200 MG PO TABS
400.0000 mg | ORAL_TABLET | Freq: Four times a day (QID) | ORAL | Status: DC | PRN
Start: 2017-07-14 — End: 2017-07-18

## 2017-07-14 MED ORDER — FLUOXETINE HCL 20 MG PO CAPS
40.0000 mg | ORAL_CAPSULE | Freq: Every day | ORAL | Status: DC
  Administered 2017-07-15: 40 mg via ORAL
  Filled 2017-07-14: qty 2

## 2017-07-14 NOTE — Progress Notes (Signed)
Visible in the day room and was social with select peers. States she feels  "good" and is denying all psych symptoms. Was med compliant. Remains on routine obs for safety. No physical complaints.

## 2017-07-14 NOTE — Plan of Care (Signed)
Problem: Coping: Goal: Ability to cope will improve Outcome: Progressing Kelly [name] is making plans in her household to reduce her stress.   Goal: Ability to verbalize feelings will improve Outcome: Progressing Kelly [name] was able to trust this Clinical research associatewriter and express her feeling.

## 2017-07-14 NOTE — [facility] Group Notes (Signed)
[facility] LCSW Group Therapy Note  Date/Time: 07/14/17, 0930  Type of Therapy and Topic:  Group Therapy:  Feelings around Relapse and Recovery  Participation Level:  Minimal   Mood:pleasant  Description of Group:    Patients in this group will discuss emotions they experience before and after a relapse. They will process how experiencing these feelings, or avoidance of experiencing them, relates to having a relapse. Facilitator will guide patients to explore emotions they have related to recovery. Patients will be encouraged to process which emotions are more powerful. They will be guided to discuss the emotional reaction significant others in their lives may have to patients' relapse or recovery. Patients will be assisted in exploring ways to respond to the emotions of others without this contributing to a relapse.  Therapeutic Goals: 1. Patient will identify two or more emotions that lead to relapse for them:  2. Patient will identify two emotions that result when they relapse:  3. Patient will identify two emotions related to recovery:  4. Patient will demonstrate ability to communicate their needs through discussion and/or role plays.   Summary of Patient Progress:Pt identified depression as the difficult emotion for her to manage and participated in group discussion about the problems of managing difficult emotions by using substances.     Therapeutic Modalities:   Cognitive Behavioral Therapy Solution-Focused Therapy Assertiveness Training Relapse Prevention Therapy  Daleen SquibbGreg Arch Methot, LCSW

## 2017-07-14 NOTE — Progress Notes (Addendum)
Inpatient Diabetes Program Recommendations  AACE/ADA: New Consensus Statement on Inpatient Glycemic Control (2015)  Target Ranges:  Prepandial:   less than 140 mg/dL      Peak postprandial:   less than 180 mg/dL (1-2 hours)      Critically ill patients:  140 - 180 mg/dL   Lab Results  Component Value Date   GLUCAP 255 (H) 07/14/2017   HGBA1C 11.4 (H) 07/12/2017    Review of Glycemic Control  Results for [name], Kelly A (MRN [id number]) as of 07/14/2017 08:32  Ref. Range 07/13/2017 07:04 07/13/2017 11:48 07/13/2017 16:41 07/13/2017 20:10 07/14/2017 07:03  Glucose-Capillary Latest Ref Range: 65 - 99 mg/dL 782242 (H) 956256 (H) 213186 (H) 236 (H) 255 (H)    Diabetes history:DM2 Outpatient Diabetes medications: Levemir QHS (no dose noted in chart and patient is not sure), Metformin 500 mg BID  Current orders for Inpatient glycemic control: Novolog 0-15 units TID with meals, Metformin 500 mg BID, Novolog 0-5 units qhs, Lantus 21 units qhs  Inpatient Diabetes Program Recommendations:Consider increasing Lantus to 26 units qhs (0.30units/kg)- fasting blood sugar 255mg /dl.  Continue Novolog 0-15 units tid.  Called Dr. Johnella MoloneyMcNew re: recommendations at 2:16pm  Susette RacerJulie Shalin Linders, RN, [place], [place]HA, CDE Diabetes Coordinator Inpatient Diabetes Program  [phone] (Team Pager) [phone] [facility] Office) 07/14/2017 8:37 AM

## 2017-07-14 NOTE — Progress Notes (Signed)
Received Kelly [name] this am for her medications and insulin, she remains compliant. She rated her depression 7/10, anxiety 2/10, and passive SI without a plan. She contributes her mood to her daughter moving into her home and trying to take over.  She will have a talk with her after this hospitalization and establish some ground rules. She is planning to go to her mothers home for a few days before returning to her home.

## 2017-07-14 NOTE — [facility] Group Notes (Signed)
[facility] Group Notes:  (Nursing/MHT/Case Management/Adjunct)  Date:  07/14/2017  Time:  5:47 AM  Type of Therapy:  Psychoeducational Skills  Participation Level:  Active  Participation Quality:  Appropriate, Attentive and Sharing  Affect:  Appropriate  Cognitive:  Appropriate  Insight:  Appropriate and Good  Engagement in Group:  Engaged  Modes of Intervention:  Discussion, Socialization and Support  Summary of Progress/Problems:  Chancy MilroyLaquanda Y Maleke Feria 07/14/2017, 5:47 AM

## 2017-07-14 NOTE — Progress Notes (Signed)
D: Pt denies SI/HI/AVH. Pt is pleasant and cooperative. Pt stated she was feeling a little better this evening. Pt did brighten on approach and would talk when engaged. Pt appeared depressed much of the evening.   A: Pt was offered support and encouragement. Pt was given scheduled medications. Pt was encourage to attend groups. Q 15 minute checks were done for safety.   R:Pt attends groups and interacts well with peers and staff. Pt is taking medication. Pt has no complaints.Pt receptive to treatment and safety maintained on unit.

## 2017-07-14 NOTE — Progress Notes (Addendum)
Recreation Therapy Notes  Date: 10.26.18  Time: 1:00pm  Location: Craft Room  Behavioral response: N/A  Topic: Goals  Intervention: Did not attend  Clinical Observations/Feedback:  Did not attend  Kelly [name] LRT/CTRS        Kelly [name] 07/14/2017 2:45 PM

## 2017-07-14 NOTE — Progress Notes (Signed)
[facility] MD Progress Note  07/14/2017 3:02 PM Kelly [name]  MRN:  [id number] Subjective:  Pt states that mood continues to improve. She states that mornings are hard on her and usually wakes up depressed but as the day goes on she feels better. She is still having some suicidal thoughts mostly in the morning. She states that she had a visit from her mother yesterday and was a really nice surprise for her. They had a great visit. We processed through things that triggered her to get to this point. She states that she lets things build up and was letting her daughter get to her. She states that this was probably a wake up call for her daughter, as well. She states that her daughter stated that she was going to try to work on things. Pt states that she feels like the Prozac has really been helping and "feels a difference since being on it." She does state that she has been having some headaches. We discussed that it could be a variety of things causing it like stress, blood sugars being elevated or medications. She wants to stay on the Prozac for now. She is sleeping well. Affect is brighter than admission and smiling much more. We discussed what her routine is at home and she states that she is taking classes for her GED on MWF so she dose wake up in the morning for that. She states that when she is done she wants to enroll in [employer] to take childhood development classes.   Principal Problem: Severe recurrent major depression with psychotic features [facility]) Diagnosis:   Patient Active Problem List   Diagnosis Date Noted  . Severe recurrent major depression with psychotic features ([facility]) [F33.3] 07/11/2017  . Diabetes mellitus without complication ([facility]) [E11.9] 07/11/2017  . Hypertension [I10] 07/11/2017   Total Time spent with patient: 20 minutes  Past Psychiatric History: See H&P  Past Medical History:  Past Medical History:  Diagnosis Date  . Diabetes mellitus without complication ([facility])   .  Hypertension     Past Surgical History:  Procedure Laterality Date  . ABDOMINAL HYSTERECTOMY     Family History: History reviewed. No pertinent family history. Family Psychiatric  History: See H&P Social History:  History  Alcohol Use No     History  Drug Use No    Social History   Social History  . Marital status: Single    Spouse name: N/A  . Number of children: N/A  . Years of education: N/A   Social History Main Topics  . Smoking status: Never Smoker  . Smokeless tobacco: Never Used  . Alcohol use No  . Drug use: No  . Sexual activity: Not Asked   Other Topics Concern  . None   Social History Narrative  . None              Sleep: Good  Appetite:  Good  Current Medications: Current Facility-Administered Medications  Medication Dose Route Frequency Provider Last Rate Last Dose  . acetaminophen (TYLENOL) tablet 650 mg  650 mg Oral Q6H PRN Clapacs, John T, MD      . alum & mag hydroxide-simeth (MAALOX/MYLANTA) 200-200-20 MG/5ML suspension 30 mL  30 mL Oral Q4H PRN Clapacs, Jackquline Denmark, MD      . Melene Muller ON 07/15/2017] FLUoxetine (PROZAC) capsule 40 mg  40 mg Oral Daily Tavarus Poteete R, MD      . hydrochlorothiazide (HYDRODIURIL) tablet 25 mg  25 mg Oral Daily Clapacs, John  T, MD   25 mg at 07/14/17 0935  . ibuprofen (ADVIL,MOTRIN) tablet 400 mg  400 mg Oral Q6H PRN Noa Galvao R, MD      . insulin aspart (novoLOG) injection 0-15 Units  0-15 Units Subcutaneous TID WC Clapacs, Jackquline DenmarkJohn T, MD   8 Units at 07/14/17 1211  . insulin aspart (novoLOG) injection 0-5 Units  0-5 Units Subcutaneous QHS Haskell RilingMcNew, Derinda Bartus R, MD   2 Units at 07/13/17 2208  . insulin glargine (LANTUS) injection 26 Units  26 Units Subcutaneous QHS Quinnetta Roepke R, MD      . lisinopril (PRINIVIL,ZESTRIL) tablet 40 mg  40 mg Oral Daily Clapacs, Jackquline DenmarkJohn T, MD   40 mg at 07/14/17 0934  . magnesium hydroxide (MILK OF MAGNESIA) suspension 30 mL  30 mL Oral Daily PRN Clapacs, John T, MD      . metFORMIN  (GLUCOPHAGE-XR) 24 hr tablet 500 mg  500 mg Oral BID WC Katelyn Kohlmeyer R, MD      . risperiDONE (RISPERDAL) tablet 0.5 mg  0.5 mg Oral QHS Nemiah Kissner R, MD   0.5 mg at 07/13/17 2209  . sulfamethoxazole-trimethoprim (BACTRIM DS,SEPTRA DS) 800-160 MG per tablet 1 tablet  1 tablet Oral Q12H Baldomero Mirarchi, Ileene HutchinsonHolly R, MD   1 tablet at 07/14/17 0936  . traZODone (DESYREL) tablet 50 mg  50 mg Oral QHS PRN Shala Baumbach, Ileene HutchinsonHolly R, MD        Lab Results:  Results for orders placed or performed during the hospital encounter of 07/11/17 (from the past 48 hour(s))  Glucose, capillary     Status: Abnormal   Collection Time: 07/12/17  4:53 PM  Result Value Ref Range   Glucose-Capillary 143 (H) 65 - 99 mg/dL  Glucose, capillary     Status: Abnormal   Collection Time: 07/12/17  8:49 PM  Result Value Ref Range   Glucose-Capillary 223 (H) 65 - 99 mg/dL  Glucose, capillary     Status: Abnormal   Collection Time: 07/13/17  7:04 AM  Result Value Ref Range   Glucose-Capillary 242 (H) 65 - 99 mg/dL   Comment 1 Notify RN   Glucose, capillary     Status: Abnormal   Collection Time: 07/13/17 11:48 AM  Result Value Ref Range   Glucose-Capillary 256 (H) 65 - 99 mg/dL   Comment 1 Notify RN   Glucose, capillary     Status: Abnormal   Collection Time: 07/13/17  4:41 PM  Result Value Ref Range   Glucose-Capillary 186 (H) 65 - 99 mg/dL  Glucose, capillary     Status: Abnormal   Collection Time: 07/13/17  8:10 PM  Result Value Ref Range   Glucose-Capillary 236 (H) 65 - 99 mg/dL   Comment 1 Notify RN    Comment 2 Document in Chart   Glucose, capillary     Status: Abnormal   Collection Time: 07/14/17  7:03 AM  Result Value Ref Range   Glucose-Capillary 255 (H) 65 - 99 mg/dL   Comment 1 Notify RN    Comment 2 Document in Chart   Glucose, capillary     Status: Abnormal   Collection Time: 07/14/17 11:57 AM  Result Value Ref Range   Glucose-Capillary 256 (H) 65 - 99 mg/dL    Blood Alcohol level:  Lab Results  Component Value  Date   ETH <10 07/11/2017    Metabolic Disorder Labs: Lab Results  Component Value Date   HGBA1C 11.4 (H) 07/12/2017   MPG 280.48 07/12/2017  MPG 283.35 07/11/2017   No results found for: PROLACTIN Lab Results  Component Value Date   CHOL 143 07/12/2017   TRIG 121 07/12/2017   HDL 35 (L) 07/12/2017   CHOLHDL 4.1 07/12/2017   VLDL 24 07/12/2017   LDLCALC 84 07/12/2017    Physical Findings: AIMS:  , ,  ,  ,    CIWA:    COWS:     Musculoskeletal: Strength & Muscle Tone: within normal limits Gait & Station: normal Patient leans: N/A  Psychiatric Specialty Exam: Physical Exam  Nursing note and vitals reviewed.   Review of Systems  Neurological: Positive for headaches.    Blood pressure 122/83, pulse 93, temperature 97.7 F (36.5 C), temperature source Oral, resp. rate 18, height 5\' 4"  (1.626 m), weight 85.3 kg (188 lb), SpO2 100 %.Body mass index is 32.27 kg/m.  General Appearance: Fairly Groomed  Eye Contact:  Good  Speech:  Clear and Coherent  Volume:  Decreased  Mood:  Depressed  Affect:  Congruent  Thought Process:  Goal Directed  Orientation:  Full (Time, Place, and Person)  Thought Content:  Negative  Suicidal Thoughts:  No  Homicidal Thoughts:  No  Memory:  Immediate;   Fair  Judgement:  Fair  Insight:  Fair  Psychomotor Activity:  Negative  Concentration:  Concentration: Fair  Recall:  [employer] of Knowledge:  Fair  Language:  Fair  Akathisia:  No      Assets:  Communication Skills Desire for Improvement Housing  ADL's:  Intact  Cognition:  WNL  Sleep:  Number of Hours: 6.15     Treatment Plan Summary: Pt is showing improvements each day. She feels the Prozac is really helping her mood. She is having some headaches which could be due to Prozac but also multiple other factors. She would like to stay on Prozac and we will monitor headaches. She is looking brighter in affect. She did have a visit by her mother and is glad to have that  support. She is more positive and future oriented. She would greatly benefit from therapy when she discharges and is working with Lorella Nimrod to get into [facility].   Plan:  1. Depression -Increase Prozac to 40 mg daily -Continue Risperdal 0.5 mg qhs for depression augmentation, AH, and vague history of manic symptoms  2. Insomnia -Continue trazodone prn  3. UTI -Continue Bactrim  4. Diabetes Type II -Diabetes care coordinators on board and making adjustments. Increase Lantus to 26 unit tonight.   5. Dispo/Social: Pt will return to her home upon discharge. Her main support is her mother who I spoke with yesterday.    Haskell Riling, MD 07/14/2017, 3:02 PM

## 2017-07-15 LAB — GLUCOSE, CAPILLARY
GLUCOSE-CAPILLARY: 258 mg/dL — AB (ref 65–99)
GLUCOSE-CAPILLARY: 260 mg/dL — AB (ref 65–99)
GLUCOSE-CAPILLARY: 236 mg/dL — AB (ref 65–99)
Glucose-Capillary: 294 mg/dL — ABNORMAL HIGH (ref 65–99)

## 2017-07-15 MED ORDER — FLUOXETINE HCL 20 MG PO CAPS
40.0000 mg | ORAL_CAPSULE | Freq: Every day | ORAL | Status: DC
  Administered 2017-07-16 – 2017-07-18 (×3): 40 mg via ORAL
  Filled 2017-07-15 (×3): qty 2

## 2017-07-15 MED ORDER — INFLUENZA VAC SPLIT QUAD 0.5 ML IM SUSY
0.5000 mL | PREFILLED_SYRINGE | INTRAMUSCULAR | Status: AC
  Administered 2017-07-16: 0.5 mL via INTRAMUSCULAR
  Filled 2017-07-15: qty 0.5

## 2017-07-15 NOTE — Progress Notes (Signed)
Data: Patient is alert and oriented, reports no hallucinations, and was unable to deny SI/HI, but reports no plan of self-harm at this time. Patient is Anxious and affect is appropriate Patient has no physical complaints at this time and rates pain 0/10. Patient reports "good" sleep for 6h7545m, appetite is good. Patient rates depression "6.5/10" , Feelings of hopelessness "5.5/10" and Anxiety "3/10" Patients goal for today is feeling happy. Discussed diabetic management, proper food choices and regular CBGs at home.   Action:  Patient is observed q15. Patient has been educated on medications, and diet. Patient encouraged to attend groups and participate with unit activites. Patient will continue with plan of care.    Response: Patient has refused metformin this AM, but continues with all other medications. Patient has no complaints at this time. Patient  receptive to treatment and safety maintained on unit.

## 2017-07-15 NOTE — [facility] Group Notes (Signed)
[facility] Group Notes:  (Nursing/MHT/Case Management/Adjunct)  Date:  07/15/2017  Time:  9:24 PM  Type of Therapy:  Wrap up Grp  Participation Level:  Active  Participation Quality:  Appropriate  Affect:  Appropriate  Cognitive:  Alert  Insight:  Good  Engagement in Group:  Engaged  Modes of Intervention:  Activity  Summary of Progress/Problems:  Kelly [name] 07/15/2017, 9:24 PM

## 2017-07-15 NOTE — [facility] Group Notes (Signed)
[facility] Group Notes:  (Nursing/MHT/Case Management/Adjunct)  Date:  07/15/2017  Time:  4:25 AM  Type of Therapy:  Psychoeducational Skills  Participation Level:  Active  Participation Quality:  Appropriate, Attentive and Sharing  Affect:  Appropriate  Cognitive:  Appropriate  Insight:  Appropriate  Engagement in Group:  Engaged  Modes of Intervention:  Discussion, Socialization and Support  Summary of Progress/Problems:  Kelly MilroyLaquanda Y Jiyah [name] 07/15/2017, 4:25 AM

## 2017-07-15 NOTE — Progress Notes (Signed)
Pleasant on contact, visible in the milieu, social with select peers. Denies all psych symptoms. Requested the flu shot which was ordered. CS was 171, 3units of novolog insulin coverage required. Clo headache at 2130 2 tylenol given with effect pending. Remains on routine obs for safety.

## 2017-07-15 NOTE — [facility] Group Notes (Signed)
LCSW Group Therapy Note  07/15/2017 1:00pm  Type of Therapy/Topic:  Group Therapy:  Balance in Life  Participation Level:  Active  Description of Group:   This group will address the concept of balance and how it feels and looks when one is unbalanced. Patients will be encouraged to process areas in their lives that are out of balance and identify reasons for remaining unbalanced. Facilitators will guide patients in utilizing problem-solving interventions to address and correct the stressor making their life unbalanced. Understanding and applying boundaries will be explored and addressed for obtaining and maintaining a balanced life. Patients will be encouraged to explore ways to assertively make their unbalanced needs known to significant others in their lives, using other group members and facilitator for support and feedback.  Therapeutic Goals: 1. Patient will identify two or more emotions or situations they have that consume much of in their lives. 2. Patient will identify signs/triggers that life has become out of balance:  3. Patient will identify two ways to set boundaries in order to achieve balance in their lives:  4. Patient will demonstrate ability to communicate their needs through discussion and/or role plays  Summary of Patient Progress:  Pt attended group and stayed the entire time. Pt states her daughter and grandchildren moved into her home. This change has caused a lot of stress on the pt. She wants to have a conversation with her daughter to set boundaries within her home.     Therapeutic Modalities:   Cognitive Behavioral Therapy Solution-Focused Therapy Assertiveness Training  Rondall AllegraCandace L Cerina Leary, LCSW 07/15/2017 2:14 PM

## 2017-07-15 NOTE — Progress Notes (Signed)
[facility] MD Progress Note  07/15/2017 1:59 PM Kelly [name]  MRN:  [id number] Subjective:    Pt refused metformin stating its causing stomach upset, but pt taking insulin and other meds. Pt anxious, endorsing passive death wish, but denies plan. Took tylenol for headache, states its getting better. fs-[id number]. Pt brighter.  Principal Problem: Severe recurrent major depression with psychotic features [facility]) Diagnosis:   Patient Active Problem List   Diagnosis Date Noted  . Severe recurrent major depression with psychotic features (HCC) [F33.3] 07/11/2017  . Diabetes mellitus without complication (HCC) [E11.9] 07/11/2017  . Hypertension [I10] 07/11/2017   Total Time spent with patient: 20 minutes  Past Psychiatric History: See H&P  Past Medical History:  Past Medical History:  Diagnosis Date  . Diabetes mellitus without complication (HCC)   . Hypertension     Past Surgical History:  Procedure Laterality Date  . ABDOMINAL HYSTERECTOMY     Family History: History reviewed. No pertinent family history. Family Psychiatric  History: See H&P Social History:  History  Alcohol Use No     History  Drug Use No    Social History   Social History  . Marital status: Single    Spouse name: N/A  . Number of children: N/A  . Years of education: N/A   Social History Main Topics  . Smoking status: Never Smoker  . Smokeless tobacco: Never Used  . Alcohol use No  . Drug use: No  . Sexual activity: Not Asked   Other Topics Concern  . None   Social History Narrative  . None              Sleep: Good  Appetite:  Good  Current Medications: Current Facility-Administered Medications  Medication Dose Route Frequency Provider Last Rate Last Dose  . acetaminophen (TYLENOL) tablet 650 mg  650 mg Oral Q6H PRN Clapacs, Jackquline Denmark, MD   650 mg at 07/15/17 1102  . alum & mag hydroxide-simeth (MAALOX/MYLANTA) 200-200-20 MG/5ML suspension 30 mL  30 mL Oral Q4H PRN Clapacs, Jackquline Denmark, MD      .  Melene Muller ON 07/16/2017] FLUoxetine (PROZAC) capsule 40 mg  40 mg Oral QPC breakfast Beverly Sessions, MD      . hydrochlorothiazide (HYDRODIURIL) tablet 25 mg  25 mg Oral Daily Clapacs, Jackquline Denmark, MD   25 mg at 07/15/17 0734  . ibuprofen (ADVIL,MOTRIN) tablet 400 mg  400 mg Oral Q6H PRN McNew, Holly R, MD      . insulin aspart (novoLOG) injection 0-15 Units  0-15 Units Subcutaneous TID WC Clapacs, Jackquline Denmark, MD   8 Units at 07/15/17 1101  . insulin aspart (novoLOG) injection 0-5 Units  0-5 Units Subcutaneous QHS Haskell Riling, MD   3 Units at 07/14/17 2141  . insulin glargine (LANTUS) injection 26 Units  26 Units Subcutaneous QHS Haskell Riling, MD   26 Units at 07/14/17 2140  . lisinopril (PRINIVIL,ZESTRIL) tablet 40 mg  40 mg Oral Daily Clapacs, John T, MD   40 mg at 07/15/17 0730  . magnesium hydroxide (MILK OF MAGNESIA) suspension 30 mL  30 mL Oral Daily PRN Clapacs, John T, MD      . risperiDONE (RISPERDAL) tablet 0.5 mg  0.5 mg Oral QHS McNew, Ileene Hutchinson, MD   0.5 mg at 07/14/17 2144  . sulfamethoxazole-trimethoprim (BACTRIM DS,SEPTRA DS) 800-160 MG per tablet 1 tablet  1 tablet Oral Q12H McNew, Ileene Hutchinson, MD   1 tablet at 07/15/17 0729  . traZODone (DESYREL)  tablet 50 mg  50 mg Oral QHS PRN McNew, Ileene HutchinsonHolly R, MD        Lab Results:  Results for orders placed or performed during the hospital encounter of 07/11/17 (from the past 48 hour(s))  Glucose, capillary     Status: Abnormal   Collection Time: 07/13/17  4:41 PM  Result Value Ref Range   Glucose-Capillary 186 (H) 65 - 99 mg/dL  Glucose, capillary     Status: Abnormal   Collection Time: 07/13/17  8:10 PM  Result Value Ref Range   Glucose-Capillary 236 (H) 65 - 99 mg/dL   Comment 1 Notify RN    Comment 2 Document in Chart   Glucose, capillary     Status: Abnormal   Collection Time: 07/14/17  7:03 AM  Result Value Ref Range   Glucose-Capillary 255 (H) 65 - 99 mg/dL   Comment 1 Notify RN    Comment 2 Document in Chart   Glucose, capillary      Status: Abnormal   Collection Time: 07/14/17 11:57 AM  Result Value Ref Range   Glucose-Capillary 256 (H) 65 - 99 mg/dL  Glucose, capillary     Status: Abnormal   Collection Time: 07/14/17  4:29 PM  Result Value Ref Range   Glucose-Capillary 195 (H) 65 - 99 mg/dL  Glucose, capillary     Status: Abnormal   Collection Time: 07/14/17  8:40 PM  Result Value Ref Range   Glucose-Capillary 275 (H) 65 - 99 mg/dL  Glucose, capillary     Status: Abnormal   Collection Time: 07/15/17  6:48 AM  Result Value Ref Range   Glucose-Capillary 258 (H) 65 - 99 mg/dL  Glucose, capillary     Status: Abnormal   Collection Time: 07/15/17 10:58 AM  Result Value Ref Range   Glucose-Capillary 294 (H) 65 - 99 mg/dL    Blood Alcohol level:  Lab Results  Component Value Date   ETH <10 07/11/2017    Metabolic Disorder Labs: Lab Results  Component Value Date   HGBA1C 11.4 (H) 07/12/2017   MPG 280.48 07/12/2017   MPG 283.35 07/11/2017   No results found for: PROLACTIN Lab Results  Component Value Date   CHOL 143 07/12/2017   TRIG 121 07/12/2017   HDL 35 (L) 07/12/2017   CHOLHDL 4.1 07/12/2017   VLDL 24 07/12/2017   LDLCALC 84 07/12/2017    Physical Findings: AIMS:  , ,  ,  ,    CIWA:    COWS:     Musculoskeletal: Strength & Muscle Tone: within normal limits Gait & Station: normal Patient leans: N/A  Psychiatric Specialty Exam: Physical Exam  Nursing note and vitals reviewed.   Review of Systems  Neurological: Positive for headaches.    Blood pressure 123/76, pulse 87, temperature 97.7 F (36.5 C), temperature source Oral, resp. rate 18, height 5\' 4"  (1.626 m), weight 85.3 kg (188 lb), SpO2 100 %.Body mass index is 32.27 kg/m.  General Appearance: Fairly Groomed  Eye Contact:  Good  Speech:  Clear and Coherent  Volume:  Decreased  Mood:  Depressed  Affect:  Congruent, brighter  Thought Process:  Goal Directed  Orientation:  Full (Time, Place, and Person)  Thought Content:   Negative  Suicidal Thoughts:  Passive death wish  Homicidal Thoughts:  No  Memory:  Immediate;   Fair  Judgement:  Fair  Insight:  Fair  Psychomotor Activity:  Negative  Concentration:  Concentration: Fair  Recall:  [employer]  Fund of Knowledge:  Fair  Language:  Fair  Akathisia:  No      Assets:  Communication Skills Desire for Improvement Housing  ADL's:  Intact  Cognition:  WNL  Sleep:  Number of Hours: 7.3     Treatment Plan Summary: Pt is showing improvements each day. She feels the Prozac is really helping her mood. She is having some headaches which could be due to Prozac but also multiple other factors. She would like to stay on Prozac and we will monitor headaches. She is looking brighter in affect. She did have a visit by her mother and is glad to have that support. She is more positive and future oriented. She would greatly benefit from therapy when she discharges and is working with Lorella Nimrod to get into [facility].   Plan:  1. Depression Prozac just increased to 40mg  , will give it after breakfast due to stomach upset  -Continue Risperdal 0.5 mg qhs for depression augmentation, AH, and vague history of manic symptoms  2. Insomnia -Continue trazodone prn  3. UTI -Continue Bactrim  4. Diabetes Type II  -Diabetes care coordinators on board and making adjustments. Increase Lantus to 26 unit tonight.  Pt not willing to take metformin, d/c it .  5. Dispo/Social: Pt will return to her home upon discharge. Her main support is her mother who I spoke with yesterday.    Beverly Sessions, MD 07/15/2017, 1:59 PMPatient ID: Kelly [name], female   DOB: [birth year], 46 y.o.   MRN: [id number]

## 2017-07-16 LAB — GLUCOSE, CAPILLARY
GLUCOSE-CAPILLARY: 218 mg/dL — AB (ref 65–99)
GLUCOSE-CAPILLARY: 226 mg/dL — AB (ref 65–99)
Glucose-Capillary: 214 mg/dL — ABNORMAL HIGH (ref 65–99)

## 2017-07-16 NOTE — Progress Notes (Signed)
Received Kelly [name] this am before her breakfast, she was compliant with her medications. She endorsed feeling depressed, but denied feeling suicidal this am. She has been OOb in the milieu and attending the group therapy sessions.

## 2017-07-16 NOTE — Plan of Care (Signed)
Problem: Coping: Goal: Ability to cope will improve Outcome: Progressing Lorian does not feel suicidal today. Goal: Ability to verbalize feelings will improve Outcome: Progressing Yorley talked with this writer freely about her feeling.

## 2017-07-16 NOTE — Progress Notes (Addendum)
[facility] MD Progress Note  07/16/2017 2:27 PM Kelly [name]  MRN:  [id number] Subjective:   FS-214. Pt  taking insulin and other meds. Pt anxious, endorsing depression due to family situation,  but denies SI.   Pt brighter, reading book in her room..  Principal Problem: Severe recurrent major depression with psychotic features (HCC) Diagnosis:   Patient Active Problem List   Diagnosis Date Noted  . Severe recurrent major depression with psychotic features (HCC) [F33.3] 07/11/2017  . Diabetes mellitus without complication (HCC) [E11.9] 07/11/2017  . Hypertension [I10] 07/11/2017   Total Time spent with patient: 20 minutes  Past Psychiatric History: See H&P  Past Medical History:  Past Medical History:  Diagnosis Date  . Diabetes mellitus without complication (HCC)   . Hypertension     Past Surgical History:  Procedure Laterality Date  . ABDOMINAL HYSTERECTOMY     Family History: History reviewed. No pertinent family history. Family Psychiatric  History: See H&P Social History:  History  Alcohol Use No     History  Drug Use No    Social History   Social History  . Marital status: Single    Spouse name: N/A  . Number of children: N/A  . Years of education: N/A   Social History Main Topics  . Smoking status: Never Smoker  . Smokeless tobacco: Never Used  . Alcohol use No  . Drug use: No  . Sexual activity: Not Asked   Other Topics Concern  . None   Social History Narrative  . None              Sleep: Good  Appetite:  Good  Current Medications: Current Facility-Administered Medications  Medication Dose Route Frequency Provider Last Rate Last Dose  . acetaminophen (TYLENOL) tablet 650 mg  650 mg Oral Q6H PRN Clapacs, Jackquline Denmark, MD   650 mg at 07/15/17 2136  . alum & mag hydroxide-simeth (MAALOX/MYLANTA) 200-200-20 MG/5ML suspension 30 mL  30 mL Oral Q4H PRN Clapacs, John T, MD      . FLUoxetine (PROZAC) capsule 40 mg  40 mg Oral QPC breakfast Beverly Sessions, MD   40 mg at 07/16/17 4098  . hydrochlorothiazide (HYDRODIURIL) tablet 25 mg  25 mg Oral Daily Clapacs, Jackquline Denmark, MD   25 mg at 07/16/17 1191  . ibuprofen (ADVIL,MOTRIN) tablet 400 mg  400 mg Oral Q6H PRN McNew, Holly R, MD      . insulin aspart (novoLOG) injection 0-15 Units  0-15 Units Subcutaneous TID WC Clapacs, Jackquline Denmark, MD   5 Units at 07/16/17 [phone]  . insulin aspart (novoLOG) injection 0-5 Units  0-5 Units Subcutaneous QHS Haskell Riling, MD   3 Units at 07/15/17 2135  . insulin glargine (LANTUS) injection 26 Units  26 Units Subcutaneous QHS Haskell Riling, MD   26 Units at 07/15/17 2135  . lisinopril (PRINIVIL,ZESTRIL) tablet 40 mg  40 mg Oral Daily Clapacs, Jackquline Denmark, MD   40 mg at 07/16/17 9562  . magnesium hydroxide (MILK OF MAGNESIA) suspension 30 mL  30 mL Oral Daily PRN Clapacs, John T, MD      . risperiDONE (RISPERDAL) tablet 0.5 mg  0.5 mg Oral QHS McNew, Ileene Hutchinson, MD   0.5 mg at 07/15/17 2137  . sulfamethoxazole-trimethoprim (BACTRIM DS,SEPTRA DS) 800-160 MG per tablet 1 tablet  1 tablet Oral Q12H McNew, Ileene Hutchinson, MD   1 tablet at 07/16/17 1308  . traZODone (DESYREL) tablet 50 mg  50 mg Oral  QHS PRN McNew, Ileene HutchinsonHolly R, MD        Lab Results:  Results for orders placed or performed during the hospital encounter of 07/11/17 (from the past 48 hour(s))  Glucose, capillary     Status: Abnormal   Collection Time: 07/14/17  4:29 PM  Result Value Ref Range   Glucose-Capillary 195 (H) 65 - 99 mg/dL  Glucose, capillary     Status: Abnormal   Collection Time: 07/14/17  8:40 PM  Result Value Ref Range   Glucose-Capillary 275 (H) 65 - 99 mg/dL  Glucose, capillary     Status: Abnormal   Collection Time: 07/15/17  6:48 AM  Result Value Ref Range   Glucose-Capillary 258 (H) 65 - 99 mg/dL  Glucose, capillary     Status: Abnormal   Collection Time: 07/15/17 10:58 AM  Result Value Ref Range   Glucose-Capillary 294 (H) 65 - 99 mg/dL  Glucose, capillary     Status: Abnormal   Collection  Time: 07/15/17  4:25 PM  Result Value Ref Range   Glucose-Capillary 260 (H) 65 - 99 mg/dL  Glucose, capillary     Status: Abnormal   Collection Time: 07/15/17  8:33 PM  Result Value Ref Range   Glucose-Capillary 236 (H) 65 - 99 mg/dL  Glucose, capillary     Status: Abnormal   Collection Time: 07/16/17  7:05 AM  Result Value Ref Range   Glucose-Capillary 214 (H) 65 - 99 mg/dL   Comment 1 Notify RN     Blood Alcohol level:  Lab Results  Component Value Date   ETH <10 07/11/2017    Metabolic Disorder Labs: Lab Results  Component Value Date   HGBA1C 11.4 (H) 07/12/2017   MPG 280.48 07/12/2017   MPG 283.35 07/11/2017   No results found for: PROLACTIN Lab Results  Component Value Date   CHOL 143 07/12/2017   TRIG 121 07/12/2017   HDL 35 (L) 07/12/2017   CHOLHDL 4.1 07/12/2017   VLDL 24 07/12/2017   LDLCALC 84 07/12/2017    Physical Findings: AIMS:  , ,  ,  ,    CIWA:    COWS:     Musculoskeletal: Strength & Muscle Tone: within normal limits Gait & Station: normal Patient leans: N/A  Psychiatric Specialty Exam: Physical Exam  Nursing note and vitals reviewed.   Review of Systems  Neurological: Positive for headaches.    Blood pressure 121/75, pulse 94, temperature 98 F (36.7 C), temperature source Oral, resp. rate 18, height 5\' 4"  (1.626 m), weight 85.3 kg (188 lb), SpO2 100 %.Body mass index is 32.27 kg/m.  General Appearance: Fairly Groomed  Eye Contact:  Good  Speech:  Clear and Coherent  Volume:  Decreased  Mood:  Depressed  Affect:  Congruent, brighter  Thought Process:  Goal Directed  Orientation:  Full (Time, Place, and Person)  Thought Content:  Negative  Suicidal Thoughts:  Passive death wish  Homicidal Thoughts:  No  Memory:  Immediate;   Fair  Judgement:  Fair  Insight:  Fair  Psychomotor Activity:  Negative  Concentration:  Concentration: Fair  Recall:  [employer]  Fund of Knowledge:  Fair  Language:  Fair  Akathisia:  No      Assets:   Communication Skills Desire for Improvement Housing  ADL's:  Intact  Cognition:  WNL  Sleep:  Number of Hours: 6.15     Treatment Plan Summary: Pt is showing improvements each day. She feels the Prozac is really helping her mood.  She is having some headaches which could be due to Prozac but also multiple other factors. She would like to stay on Prozac and we will monitor headaches. She is looking brighter in affect. She did have a visit by her mother and is glad to have that support. She is more positive and future oriented. She would greatly benefit from therapy when she discharges and is working with Lorella Nimrod to get into [facility].   Plan:  1. Depression Prozac just increased to 40mg  , given after breakfast due to stomach upset  -Continue Risperdal 0.5 mg qhs for depression augmentation, AH, and vague history of manic symptoms  2. Insomnia -Continue trazodone prn  3. UTI -Continue Bactrim  4. Diabetes Type II  -Diabetes care coordinators on board and making adjustments. Increase Lantus to 26 unit tonight.  Pt not willing to take metformin, stating it causes stomach upset .  5. Dispo/Social: Pt will return to her home upon discharge. Her main support is her mother .   Beverly Sessions, MD 07/16/2017, 2:27 PMPatient ID: Kelly [name], female   DOB: [birth year], 46 y.o.   MRN: [id number] Patient ID: Kelly [name], female   DOB: 04/11/1971, 46 y.o.   MRN: [id number]

## 2017-07-16 NOTE — [facility] Group Notes (Signed)
LCSW Group Therapy Note  07/16/2017 1:15pm  Type of Therapy/Topic:  Group Therapy:  Emotion Regulation  Participation Level:  Minimal   Description of Group:   The purpose of this group is to assist patients in learning to regulate negative emotions and experience positive emotions. Patients will be guided to discuss ways in which they have been vulnerable to their negative emotions. These vulnerabilities will be juxtaposed with experiences of positive emotions or situations, and patients will be challenged to use positive emotions to combat negative ones. Special emphasis will be placed on coping with negative emotions in conflict situations, and patients will process healthy conflict resolution skills.  Therapeutic Goals: 1. Patient will identify two positive emotions or experiences to reflect on in order to balance out negative emotions 2. Patient will label two or more emotions that they find the most difficult to experience 3. Patient will demonstrate positive conflict resolution skills through discussion and/or role plays  Summary of Patient Progress:  Pt attended group and stayed the entire time. She sat quietly and listened to other group members share.      Therapeutic Modalities:   Cognitive Behavioral Therapy Feelings Identification Dialectical Behavioral Therapy   Rondall AllegraCandace L Jaleen Grupp, LCSW 07/16/2017 2:42 PM

## 2017-07-16 NOTE — Progress Notes (Signed)
Social with peers and visible in the milieu, working on a puzzle. Endorses a "little " depression. Denies all other psych symptoms.  Visit with her husband went well. Has no physical complaints. Remains on routine obs for safety

## 2017-07-17 LAB — GLUCOSE, CAPILLARY
GLUCOSE-CAPILLARY: 282 mg/dL — AB (ref 65–99)
GLUCOSE-CAPILLARY: 226 mg/dL — AB (ref 65–99)
Glucose-Capillary: 223 mg/dL — ABNORMAL HIGH (ref 65–99)
Glucose-Capillary: 196 mg/dL — ABNORMAL HIGH (ref 65–99)

## 2017-07-17 MED ORDER — INSULIN ASPART 100 UNIT/ML ~~LOC~~ SOLN: 0.0000 [IU] | Freq: Three times a day (TID) | SUBCUTANEOUS | 0 refills | Status: DC

## 2017-07-17 MED ORDER — HYDROCHLOROTHIAZIDE 25 MG PO TABS: 25.0000 mg | ORAL_TABLET | Freq: Every day | ORAL | 0 refills | Status: DC

## 2017-07-17 MED ORDER — RISPERIDONE 0.5 MG PO TABS: 0.5000 mg | ORAL_TABLET | Freq: Every day | ORAL | 1 refills | Status: DC

## 2017-07-17 MED ORDER — TRAZODONE HCL 50 MG PO TABS: 50.0000 mg | ORAL_TABLET | Freq: Every evening | ORAL | 1 refills | Status: DC | PRN

## 2017-07-17 MED ORDER — INSULIN GLARGINE 100 UNIT/ML ~~LOC~~ SOLN: 30.0000 [IU] | Freq: Every day | SUBCUTANEOUS | 0 refills | Status: DC

## 2017-07-17 MED ORDER — INSULIN ASPART 100 UNIT/ML ~~LOC~~ SOLN: 4.0000 [IU] | Freq: Three times a day (TID) | SUBCUTANEOUS | 0 refills | Status: DC

## 2017-07-17 MED ORDER — HYDROCHLOROTHIAZIDE 25 MG PO TABS: 25.0000 mg | ORAL_TABLET | Freq: Every day | ORAL | 1 refills | Status: DC

## 2017-07-17 MED ORDER — INSULIN ASPART 100 UNIT/ML ~~LOC~~ SOLN
4.0000 [IU] | Freq: Three times a day (TID) | SUBCUTANEOUS | Status: DC
  Administered 2017-07-17 – 2017-07-18 (×4): 4 [IU] via SUBCUTANEOUS
  Filled 2017-07-17 (×2): qty 1

## 2017-07-17 MED ORDER — FLUOXETINE HCL 40 MG PO CAPS: 40.0000 mg | ORAL_CAPSULE | Freq: Every day | ORAL | 0 refills | Status: DC

## 2017-07-17 MED ORDER — FLUOXETINE HCL 40 MG PO CAPS: 40.0000 mg | ORAL_CAPSULE | Freq: Every day | ORAL | 1 refills | Status: DC

## 2017-07-17 MED ORDER — RISPERIDONE 0.5 MG PO TABS: 0.5000 mg | ORAL_TABLET | Freq: Every day | ORAL | 0 refills | Status: DC

## 2017-07-17 MED ORDER — TRAZODONE HCL 50 MG PO TABS: 50.0000 mg | ORAL_TABLET | Freq: Every evening | ORAL | 0 refills | Status: DC | PRN

## 2017-07-17 MED ORDER — INSULIN ASPART 100 UNIT/ML ~~LOC~~ SOLN: 0.0000 [IU] | Freq: Every day | SUBCUTANEOUS | 0 refills | Status: DC

## 2017-07-17 MED ORDER — INSULIN GLARGINE 100 UNIT/ML ~~LOC~~ SOLN
30.0000 [IU] | Freq: Every day | SUBCUTANEOUS | Status: DC
  Administered 2017-07-17: 30 [IU] via SUBCUTANEOUS
  Filled 2017-07-17 (×3): qty 0.3

## 2017-07-17 MED ORDER — LISINOPRIL 40 MG PO TABS: 40.0000 mg | ORAL_TABLET | Freq: Every day | ORAL | 0 refills | Status: DC

## 2017-07-17 NOTE — [facility] Group Notes (Signed)
[facility] Group Notes:  (Nursing/MHT/Case Management/Adjunct)  Date:  07/17/2017  Time:  8:10 PM  Type of Therapy:  Psychoeducational Skills  Participation Level:  Active  Participation Quality:  Appropriate and Attentive  Affect:  Appropriate  Cognitive:  Alert and Appropriate  Insight:  Appropriate and Good  Engagement in Group:  Engaged and Improving  Modes of Intervention:  Discussion, Education and Socialization  Summary of Progress/Problems:  Kelly [name]  Marnie Fazzino 07/17/2017, 8:10 PM

## 2017-07-17 NOTE — Progress Notes (Signed)
Data: Patient is alert and oriented, reports no avh, and has no thoughts of SI/HI at this time. Patient is smiling and has an appropriate affect. Patient has no complaints and a pain rating of 0/10. Patient reports good sleep qulaity for 6h,3975m appetite is "good". Patient rates depression "1/10" , Feelings of hopelessness "1/10" and Anxiety "1/10" Patients goal for today is to be being happy talking with others.   Action:  Q x 15 minute observation checks were completed for safety. Patient was provided with education on medications. Patient was offered support and encouragement. Patient was given scheduled medications. Patient  was encourage to attend groups, participate in unit activities and continue with plan of care.    Response: Patient is was complaint with medications. Patient has no complaints at this time. Patient is receptive to treatment and safety maintained on unit.

## 2017-07-17 NOTE — [facility] Group Notes (Signed)
[facility] LCSW Group Therapy Note  Date/Time: 07/17/17, 0930  Type of Therapy and Topic:  Group Therapy:  Overcoming Obstacles  Participation Level:  active  Description of Group:    In this group patients will be encouraged to explore what they see as obstacles to their own wellness and recovery. They will be guided to discuss their thoughts, feelings, and behaviors related to these obstacles. The group will process together ways to cope with barriers, with attention given to specific choices patients can make. Each patient will be challenged to identify changes they are motivated to make in order to overcome their obstacles. This group will be process-oriented, with patients participating in exploration of their own experiences as well as giving and receiving support and challenge from other group members.  Therapeutic Goals: 1. Patient will identify personal and current obstacles as they relate to admission. 2. Patient will identify barriers that currently interfere with their wellness or overcoming obstacles.  3. Patient will identify feelings, thought process and behaviors related to these barriers. 4. Patient will identify two changes they are willing to make to overcome these obstacles:    Summary of Patient Progress: Pt identified longer term obstacles in her life to her goal of owning a house as credit problems and lack of income.  Pt did take part in the group discussion about the topic and said that starting to address her bad credit was one thing she can do to make progress on her goal.      Therapeutic Modalities:   Cognitive Behavioral Therapy Solution Focused Therapy Motivational Interviewing Relapse Prevention Therapy  Daleen SquibbGreg Tansy Lorek, LCSW

## 2017-07-17 NOTE — Progress Notes (Signed)
[facility] MD Progress Note  07/17/2017 1:43 PM Kelly [name]  MRN:  [id number]   Subjective:  Pt states that she is feeling much better. She states that she has gotten a lot out of being in the hospital. She states that groups were extremely helpful. Also meeting with staff and talking through things was very helpful. She states that she has talked with her daughter about how she was feeling and her daughter discussed that she will change things while living with her mother. Pt's mother also is a support and called on the unit. Pt states that SI is resolving and is feeling much better day by day. She is sleeping well. She has much brighter affect and smiling during interview. She is caring for her ADLs, showering, and her hair is done today and much less disheveled. She has been out of her room and socializing with peers. Appetite is good. She denies AH or VH.  Principal Problem: Severe recurrent major depression with psychotic features [facility]) Diagnosis:   Patient Active Problem List   Diagnosis Date Noted  . Severe recurrent major depression with psychotic features (HCC) [F33.3] 07/11/2017  . Diabetes mellitus without complication (HCC) [E11.9] 07/11/2017  . Hypertension [I10] 07/11/2017   Total Time spent with patient: 20 minutes  Past Psychiatric History: See H&P  Past Medical History:  Past Medical History:  Diagnosis Date  . Diabetes mellitus without complication (HCC)   . Hypertension     Past Surgical History:  Procedure Laterality Date  . ABDOMINAL HYSTERECTOMY     Family History: History reviewed. No pertinent family history. Family Psychiatric  History: See H&P Social History:  History  Alcohol Use No     History  Drug Use No    Social History   Social History  . Marital status: Single    Spouse name: N/A  . Number of children: N/A  . Years of education: N/A   Social History Main Topics  . Smoking status: Never Smoker  . Smokeless tobacco: Never Used  . Alcohol  use No  . Drug use: No  . Sexual activity: Not Asked   Other Topics Concern  . None   Social History Narrative  . None   Sleep: Good  Appetite:  Good  Current Medications: Current Facility-Administered Medications  Medication Dose Route Frequency Provider Last Rate Last Dose  . acetaminophen (TYLENOL) tablet 650 mg  650 mg Oral Q6H PRN Clapacs, Jackquline Denmark, MD   650 mg at 07/16/17 2123  . alum & mag hydroxide-simeth (MAALOX/MYLANTA) 200-200-20 MG/5ML suspension 30 mL  30 mL Oral Q4H PRN Clapacs, John T, MD      . FLUoxetine (PROZAC) capsule 40 mg  40 mg Oral QPC breakfast Beverly Sessions, MD   40 mg at 07/17/17 0809  . hydrochlorothiazide (HYDRODIURIL) tablet 25 mg  25 mg Oral Daily Clapacs, Jackquline Denmark, MD   25 mg at 07/17/17 0810  . ibuprofen (ADVIL,MOTRIN) tablet 400 mg  400 mg Oral Q6H PRN Medardo Hassing R, MD      . insulin aspart (novoLOG) injection 0-15 Units  0-15 Units Subcutaneous TID WC Clapacs, Jackquline Denmark, MD   8 Units at 07/17/17 1116  . insulin aspart (novoLOG) injection 0-5 Units  0-5 Units Subcutaneous QHS Haskell Riling, MD   2 Units at 07/16/17 2122  . insulin glargine (LANTUS) injection 26 Units  26 Units Subcutaneous QHS Haskell Riling, MD   26 Units at 07/16/17 2121  . lisinopril (PRINIVIL,ZESTRIL) tablet 40  mg  40 mg Oral Daily Clapacs, Jackquline Denmark, MD   40 mg at 07/17/17 0809  . magnesium hydroxide (MILK OF MAGNESIA) suspension 30 mL  30 mL Oral Daily PRN Clapacs, John T, MD      . risperiDONE (RISPERDAL) tablet 0.5 mg  0.5 mg Oral QHS Cason Dabney R, MD   0.5 mg at 07/16/17 2121  . sulfamethoxazole-trimethoprim (BACTRIM DS,SEPTRA DS) 800-160 MG per tablet 1 tablet  1 tablet Oral Q12H Sudeep Scheibel, Ileene Hutchinson, MD   1 tablet at 07/17/17 [phone]  . traZODone (DESYREL) tablet 50 mg  50 mg Oral QHS PRN Deondria Puryear, Ileene Hutchinson, MD        Lab Results:  Results for orders placed or performed during the hospital encounter of 07/11/17 (from the past 48 hour(s))  Glucose, capillary     Status: Abnormal    Collection Time: 07/15/17  4:25 PM  Result Value Ref Range   Glucose-Capillary 260 (H) 65 - 99 mg/dL  Glucose, capillary     Status: Abnormal   Collection Time: 07/15/17  8:33 PM  Result Value Ref Range   Glucose-Capillary 236 (H) 65 - 99 mg/dL  Glucose, capillary     Status: Abnormal   Collection Time: 07/16/17  7:05 AM  Result Value Ref Range   Glucose-Capillary 214 (H) 65 - 99 mg/dL   Comment 1 Notify RN   Glucose, capillary     Status: Abnormal   Collection Time: 07/16/17  4:20 PM  Result Value Ref Range   Glucose-Capillary 218 (H) 65 - 99 mg/dL  Glucose, capillary     Status: Abnormal   Collection Time: 07/16/17  8:55 PM  Result Value Ref Range   Glucose-Capillary 226 (H) 65 - 99 mg/dL   Comment 1 Notify RN   Glucose, capillary     Status: Abnormal   Collection Time: 07/17/17  6:47 AM  Result Value Ref Range   Glucose-Capillary 223 (H) 65 - 99 mg/dL  Glucose, capillary     Status: Abnormal   Collection Time: 07/17/17 11:15 AM  Result Value Ref Range   Glucose-Capillary 282 (H) 65 - 99 mg/dL    Blood Alcohol level:  Lab Results  Component Value Date   ETH <10 07/11/2017    Metabolic Disorder Labs: Lab Results  Component Value Date   HGBA1C 11.4 (H) 07/12/2017   MPG 280.48 07/12/2017   MPG 283.35 07/11/2017   No results found for: PROLACTIN Lab Results  Component Value Date   CHOL 143 07/12/2017   TRIG 121 07/12/2017   HDL 35 (L) 07/12/2017   CHOLHDL 4.1 07/12/2017   VLDL 24 07/12/2017   LDLCALC 84 07/12/2017    Musculoskeletal: Strength & Muscle Tone: within normal limits Gait & Station: normal Patient leans: N/A  Psychiatric Specialty Exam: Physical Exam  ROS  Blood pressure 130/87, pulse 95, temperature 98.1 F (36.7 C), temperature source Oral, resp. rate 18, height 5\' 4"  (1.626 m), weight 85.3 kg (188 lb), SpO2 100 %.Body mass index is 32.27 kg/m.  General Appearance: Casual, better groomed  Eye Contact:  Good  Speech:  Clear and Coherent   Volume:  Normal  Mood:  Euthymic  Affect:  Congruent  Thought Process:  Goal Directed  Orientation:  Full (Time, Place, and Person)  Thought Content:  Negative  Suicidal Thoughts:  No  Homicidal Thoughts:  No  Memory:  Immediate;   Fair  Judgement:  Fair  Insight:  Fair  Psychomotor Activity:  Negative  Concentration:  Concentration: Fair  Recall:  FiservFair  Fund of Knowledge:  Fair  Language:  Fair  Akathisia:  No      Assets:  Communication Skills Desire for Improvement  ADL's:  Intact  Cognition:  WNL  Sleep:  Number of Hours: 6.15     Treatment Plan Summary: 46 yo female who was admitted for worsening depression and SI. Pt is showing improvements in her mood each day and has been very active in groups. She is much more positive and future oriented.   Plan:  MDD -Continue Prozac 40 mg daily -Continue Risperdal 0.5 mg qhs for depression augmentation, AH, and vague history of manic symptoms  Insomnia -Continue trazodone prn  Diabetes Type II  Diabetes care coordinators on board. Recommend increasing lantus to 30 units qhs and novolog 4 units TID -Will have case manager schedule PCP appointment for diabetes managment  Dispo -Pt will return home on discharge. Her main support is her mother. Likely discharge tomorrow 10/30.     Haskell RilingHolly R Channin Agustin, MD 07/17/2017, 1:43 PM

## 2017-07-17 NOTE — Progress Notes (Signed)
Recreation Therapy Notes  Time: 1:00 pm  Location: Craft Room  Behavioral response: Appropriate  Intervention Topic: Coping skills  Discussion/Intervention: Group content on today was focused on coping skills. The group defined coping skills and when they can be used. Individuals described how they normally cope with things and the coping skills they normally use. Patients expressed why it is important to cope with things and how not coping with things can affect you. The group participated in the intervention "My coping box" and made coping boxes while adding coping skills they could use in the future to the box.  Clinical Observations/Feedback:  Patient came to group and was pleasant with staff and peers. She identified coping as a way to deal with things. Individual described taking walks and talking with others as coping skills she uses. Individual actively participated in the intervention.  Kelly [name] LRT/CTRS       Kelly [name] 07/17/2017 2:11 PM

## 2017-07-17 NOTE — Progress Notes (Signed)
Recreation Therapy Notes  Date: 10.29.18  Time: 3:00pm   Location: Craft Room  Behavioral response: Appropriate   Group Type: Art/Craft  Participation level: Active  Communication: Patiently positively communicated with peers and staff during group   Comments: N/A  Kennia Vanvorst LRT/CTRS        Kelly [name] 07/17/2017 4:32 PM

## 2017-07-17 NOTE — Progress Notes (Signed)
Contacted case management Corey @[phone]  to set up appointment for PCP after discharge.

## 2017-07-17 NOTE — Tx Team (Signed)
Interdisciplinary Treatment and Diagnostic Plan Update  07/17/2017 Time of Session: [address]1057 Kelly [name] MRN: [id number]  Principal Diagnosis: Severe recurrent major depression with psychotic features [facility](HCC)  Secondary Diagnoses: Principal Problem:   Severe recurrent major depression with psychotic features (HCC)   Current Medications:  Current Facility-Administered Medications  Medication Dose Route Frequency Provider Last Rate Last Dose  . acetaminophen (TYLENOL) tablet 650 mg  650 mg Oral Q6H PRN Clapacs, Jackquline DenmarkJohn T, MD   650 mg at 07/16/17 2123  . alum & mag hydroxide-simeth (MAALOX/MYLANTA) 200-200-20 MG/5ML suspension 30 mL  30 mL Oral Q4H PRN Clapacs, John T, MD      . FLUoxetine (PROZAC) capsule 40 mg  40 mg Oral QPC breakfast Beverly SessionsSubedi, Jagannath, MD   40 mg at 07/17/17 0809  . hydrochlorothiazide (HYDRODIURIL) tablet 25 mg  25 mg Oral Daily Clapacs, Jackquline DenmarkJohn T, MD   25 mg at 07/17/17 0810  . ibuprofen (ADVIL,MOTRIN) tablet 400 mg  400 mg Oral Q6H PRN McNew, Holly R, MD      . insulin aspart (novoLOG) injection 0-15 Units  0-15 Units Subcutaneous TID WC Clapacs, Jackquline DenmarkJohn T, MD   8 Units at 07/17/17 1116  . insulin aspart (novoLOG) injection 0-5 Units  0-5 Units Subcutaneous QHS Haskell RilingMcNew, Holly R, MD   2 Units at 07/16/17 2122  . insulin glargine (LANTUS) injection 26 Units  26 Units Subcutaneous QHS Haskell RilingMcNew, Holly R, MD   26 Units at 07/16/17 2121  . lisinopril (PRINIVIL,ZESTRIL) tablet 40 mg  40 mg Oral Daily Clapacs, Jackquline DenmarkJohn T, MD   40 mg at 07/17/17 0809  . magnesium hydroxide (MILK OF MAGNESIA) suspension 30 mL  30 mL Oral Daily PRN Clapacs, John T, MD      . risperiDONE (RISPERDAL) tablet 0.5 mg  0.5 mg Oral QHS McNew, Holly R, MD   0.5 mg at 07/16/17 2121  . sulfamethoxazole-trimethoprim (BACTRIM DS,SEPTRA DS) 800-160 MG per tablet 1 tablet  1 tablet Oral Q12H McNew, Ileene HutchinsonHolly R, MD   1 tablet at 07/17/17 [phone]  . traZODone (DESYREL) tablet 50 mg  50 mg Oral QHS PRN McNew, Ileene HutchinsonHolly R, MD       PTA  Medications: Prescriptions Prior to Admission  Medication Sig Dispense Refill Last Dose  . cephALEXin (KEFLEX) 500 MG capsule Take 1 capsule (500 mg total) by mouth 3 (three) times daily. 30 capsule 0   . cyclobenzaprine (FLEXERIL) 10 MG tablet Take 1 tablet (10 mg total) by mouth every 8 (eight) hours as needed for muscle spasms. 30 tablet 1   . diazepam (VALIUM) 2 MG tablet Take 1 tablet (2 mg total) by mouth every 6 (six) hours as needed for anxiety. 12 tablet 0   . guaiFENesin-codeine 100-10 MG/5ML syrup Take 5 mLs by mouth every 6 (six) hours as needed for cough. 120 mL 0   . hydrochlorothiazide (HYDRODIURIL) 25 MG tablet Take 25 mg by mouth daily.     [name]. HYDROcodone-acetaminophen (NORCO/VICODIN) 5-325 MG tablet Take 1 tablet by mouth every 4 (four) hours as needed for moderate pain. 20 tablet 0   . ibuprofen (ADVIL,MOTRIN) 600 MG tablet Take 1 tablet (600 mg total) by mouth every 8 (eight) hours as needed. 15 tablet 0   . ibuprofen (ADVIL,MOTRIN) 800 MG tablet Take 1 tablet (800 mg total) by mouth every 8 (eight) hours as needed. 30 tablet 0   . insulin detemir (LEVEMIR) 100 UNIT/ML injection Inject into the skin at bedtime.     . lidocaine (LIDODERM) 5 %  Place 1 patch onto the skin every 12 (twelve) hours. Remove & Discard patch within 12 hours or as directed by MD 10 patch 0   . lisinopril (PRINIVIL,ZESTRIL) 40 MG tablet Take 40 mg by mouth daily.     . metFORMIN (GLUCOPHAGE) 500 MG tablet Take by mouth 2 (two) times daily with a meal.     . oxyCODONE-acetaminophen (ROXICET) 5-325 MG tablet Take 1-2 tablets by mouth every 4 (four) hours as needed for severe pain. 15 tablet 0   . phenazopyridine (PYRIDIUM) 200 MG tablet Take 1 tablet (200 mg total) by mouth 3 (three) times daily as needed for pain. 20 tablet 0   . sulfamethoxazole-trimethoprim (BACTRIM DS,SEPTRA DS) 800-160 MG tablet Take 1 tablet by mouth 2 (two) times daily. 20 tablet 0   . traMADol (ULTRAM) 50 MG tablet Take 1 tablet (50  mg total) by mouth every 6 (six) hours as needed. 12 tablet 0   . traMADol (ULTRAM) 50 MG tablet Take 1 tablet (50 mg total) by mouth every 6 (six) hours as needed for moderate pain. 12 tablet 0     Patient Stressors: Financial difficulties Marital or family conflict  Patient Strengths: Average or above average intelligence Capable of independent living  Treatment Modalities: Medication Management, Group therapy, Case management,  1 to 1 session with clinician, Psychoeducation, Recreational therapy.   Physician Treatment Plan for Primary Diagnosis: Severe recurrent major depression with psychotic features (HCC) Long Term Goal(s): Improvement in symptoms so as ready for discharge Improvement in symptoms so as ready for discharge   Short Term Goals: Ability to disclose and discuss suicidal ideas Ability to identify and develop effective coping behaviors will improve Ability to identify and develop effective coping behaviors will improve Compliance with prescribed medications will improve Ability to identify triggers associated with substance abuse/mental health issues will improve  Medication Management: Evaluate patient's response, side effects, and tolerance of medication regimen.  Therapeutic Interventions: 1 to 1 sessions, Unit Group sessions and Medication administration.  Evaluation of Outcomes: Progressing  Physician Treatment Plan for Secondary Diagnosis: Principal Problem:   Severe recurrent major depression with psychotic features (HCC)  Long Term Goal(s): Improvement in symptoms so as ready for discharge Improvement in symptoms so as ready for discharge   Short Term Goals: Ability to disclose and discuss suicidal ideas Ability to identify and develop effective coping behaviors will improve Ability to identify and develop effective coping behaviors will improve Compliance with prescribed medications will improve Ability to identify triggers associated with substance  abuse/mental health issues will improve     Medication Management: Evaluate patient's response, side effects, and tolerance of medication regimen.  Therapeutic Interventions: 1 to 1 sessions, Unit Group sessions and Medication administration.  Evaluation of Outcomes: Progressing   RN Treatment Plan for Primary Diagnosis: Severe recurrent major depression with psychotic features (HCC) Long Term Goal(s): Knowledge of disease and therapeutic regimen to maintain health will improve  Short Term Goals: Ability to disclose and discuss suicidal ideas, Ability to identify and develop effective coping behaviors will improve and Compliance with prescribed medications will improve  Medication Management: RN will administer medications as ordered by provider, will assess and evaluate patient's response and provide education to patient for prescribed medication. RN will report any adverse and/or side effects to prescribing provider.  Therapeutic Interventions: 1 on 1 counseling sessions, Psychoeducation, Medication administration, Evaluate responses to treatment, Monitor vital signs and CBGs as ordered, Perform/monitor CIWA, COWS, AIMS and Fall Risk screenings as ordered, Perform wound  care treatments as ordered.  Evaluation of Outcomes: Progressing   LCSW Treatment Plan for Primary Diagnosis: Severe recurrent major depression with psychotic features (HCC) Long Term Goal(s): Safe transition to appropriate next level of care at discharge, Engage patient in therapeutic group addressing interpersonal concerns.  Short Term Goals: Engage patient in aftercare planning with referrals and resources and Increase social support  Therapeutic Interventions: Assess for all discharge needs, 1 to 1 time with Social worker, Explore available resources and support systems, Assess for adequacy in community support network, Educate family and significant other(s) on suicide prevention, Complete Psychosocial Assessment,  Interpersonal group therapy.  Evaluation of Outcomes: Progressing   Progress in Treatment: Attending groups: Yes Participating in groups: Yes Taking medication as prescribed: Yes. Toleration medication: Yes. Family/Significant other contact made: Yes, individual(s) contacted:  daughter Patient understands diagnosis: Yes. Discussing patient identified problems/goals with staff: Yes. Medical problems stabilized or resolved: Yes. Denies suicidal/homicidal ideation: Yes. Issues/concerns per patient self-inventory: No. Other: none  New problem(s) identified: No, Describe:  none  New Short Term/Long Term Goal(s):Pt goal: "get better, less suicidal thoughts."  Discharge Plan or Barriers: Pt will [facility] outpt [facility] at [facility].  Reason for Continuation of Hospitalization: Depression Medication stabilization  Estimated Length of Stay: 1 days. Attendees: Patient: 07/17/2017   Physician: Dr Johnella Moloney 07/17/2017   Nursing: Hulan Amato, RN 07/17/2017   RN Care Manager: 07/17/2017   Social Worker: Daleen Squibb, LCSW 07/17/2017   Recreational Therapist: Garret Reddish, LRT, CTRS 07/17/2017   Other:  07/17/2017   Other:  07/17/2017   Other: 07/17/2017            Scribe for Treatment Team: Lorri Frederick, LCSW 07/17/2017 11:44 AM

## 2017-07-17 NOTE — Progress Notes (Signed)
CSW contacted [facility] at pt request to schedule follow up.  CSW informed that pt has been banned from the program due to repeated no shows and is not eligible to return. Garner NashGregory Chazlyn Cude, MSW, LCSW Clinical Social Worker 07/17/2017 10:37 AM

## 2017-07-17 NOTE — Progress Notes (Signed)
Inpatient Diabetes Program Recommendations  AACE/ADA: New Consensus Statement on Inpatient Glycemic Control (2015)  Target Ranges:  Prepandial:   less than 140 mg/dL      Peak postprandial:   less than 180 mg/dL (1-2 hours)      Critically ill patients:  140 - 180 mg/dL   Results for Markham JordanBENTON, [facility] ([facility] [id number]) as of 07/17/2017 10:54  Ref. Range 07/16/2017 07:05 07/16/2017 16:20 07/16/2017 20:55 07/17/2017 06:47  Glucose-Capillary Latest Ref Range: 65 - 99 mg/dL 409214 (H) 811218 (H) 914226 (H) 223 (H)  Results for Markham JordanBENTON, Al[facility] (MRN [id number]) as of 07/17/2017 10:54  Ref. Range 07/11/2017 16:27 07/12/2017 07:20  Hemoglobin A1C Latest Ref Range: 4.8 - 5.6 % 11.5 (H) 11.4 (H)   Review of Glycemic Control  Diabetes history: DM2 Outpatient Diabetes medications:  Levemir QHS (no dose noted in chart and patient is not sure), Metformin 500 mg BID Current orders for Inpatient glycemic control:Lantus 26 units QHS, Novolog 0-15 units TID with meals, Novolog 0-5 units QHS  Inpatient Diabetes Program Recommendations:  Insulin - Basal: Please consider increasing Lantus to 30 units QHS. Insulin - Meal Coverage: Please consider ordering Novolog 4 units TID with meals for meal coverage if patient eats at least 50% of meals (in addition to Novolog correction). HgbA1C: A1C 11.4% on 07/12/17 indicating an average glucose of 280 mg/dl over the past 2-3 months. Recommend patient follow up with PCP/Endocrinologist to improve DM control.  Thanks, Orlando PennerMarie Brytney Somes, RN, MSN, CDE Diabetes Coordinator Inpatient Diabetes Program [phone] (Team Pager from 8am to 5pm)

## 2017-07-18 LAB — GLUCOSE, CAPILLARY
GLUCOSE-CAPILLARY: 226 mg/dL — AB (ref 65–99)
GLUCOSE-CAPILLARY: 162 mg/dL — AB (ref 65–99)
Glucose-Capillary: 261 mg/dL — ABNORMAL HIGH (ref 65–99)

## 2017-07-18 NOTE — Discharge Summary (Signed)
Physician Discharge Summary Note  Patient:  Kelly [name] is an 46 y.o., female MRN:  [id number] DOB:  1970-11-01 Patient phone:  [phone] (home)  Patient address:   [place] [address],  Total Time spent with patient: 20 minutes  Plus 20 minutes of medication reconciliation, counseling on medications, and discharge planning and documentation.   Date of Admission:  07/11/2017 Date of Discharge: 07/18/17  Reason for Admission:  SI  Principal Problem: Severe recurrent major depression with psychotic features [facility]) Discharge Diagnoses: Patient Active Problem List   Diagnosis Date Noted  . Severe recurrent major depression with psychotic features ([place]) [F33.3] 07/11/2017  . Diabetes mellitus without complication ([place]) [Q11.9] 07/11/2017  . Hypertension [I10] 07/11/2017    Past Psychiatric History: See H&P  Past Medical History:  Past Medical History:  Diagnosis Date  . Diabetes mellitus without complication ([place])   . Hypertension     Past Surgical History:  Procedure Laterality Date  . ABDOMINAL HYSTERECTOMY     Family History: History reviewed. No pertinent family history. Family Psychiatric  History: See H&P Social History:  History  Alcohol Use No     History  Drug Use No    Social History   Social History  . Marital status: Single    Spouse name: N/A  . Number of children: N/A  . Years of education: N/A   Social History Main Topics  . Smoking status: Never Smoker  . Smokeless tobacco: Never Used  . Alcohol use No  . Drug use: No  . Sexual activity: Not Asked   Other Topics Concern  . None   Social History Narrative  . None    Hospital Course:  Pt was started on Prozac for depression and OCD like symptoms. This was titrated to 40 mg daily and tolerated it well. She was also started on Risperdal.5 mg qhs as she was endorsing some AH prior to admission likely due to severe depression. Diabetes care coordinators were on board  to adjust her insulin regimen. Pt participated  In groups and was very social with peers. By day of discharge, pt denied SI or any thought of self harm. She was much brighter in affect and mood was improved. She had visits by her mother who was a great support for her. Pt met with Lanae Boast with peer support and they plan to meet tomorrow. Pt would greatly benefit from therapy as an outpatient and will set this up with [facility]. Pt was organized and goal directed. She was given 7 day supply of medications from our pharmacy.  The patient is at low risk of imminent suicide. Patient denied thoughts, intent, or plan for harm to self or others, expressed significant future orientation and plans to reenroll in classes for her GED, and expressed an ability to mobilize assistance for her needs. She is presently void of any contributing psychiatric symptoms, cognitive difficulties, or substance use which would elevate her risk for lethality. Chronic risk for lethality is elevated in light of poor coping skills The chronic risk is presently mitigated by her ongoing desire and engagement in [facility] treatment and mobilization of support from family and friends. Chronic risk may elevate if she experiences any significant loss or worsening of symptoms, which can be managed and monitored through outpatient providers. At this time,a cute risk for lethality is low and she is stable for ongoing outpatient management.   Modifiable risk factors were addressed during this hospitalization through appropriate pharmacotherapy and establishment of outpatient  follow-up treatment. Some risk factors for suicide are situational (i.e. Unstable housing) or related personality pathology (i.e. Poor coping mechanisms) and thus cannot be further mitigated by continued hospitalization in this setting.    Musculoskeletal: Strength & Muscle Tone: within normal limits Gait & Station: normal Patient leans: N/A  Psychiatric Specialty Exam: Physical Exam   Nursing note and vitals reviewed.   Review of Systems  All other systems reviewed and are negative.   Blood pressure 114/72, pulse 95, temperature 97.7 F (36.5 C), temperature source Oral, resp. rate 18, height '5\' 4"'$  (1.626 m), weight 85.3 kg (188 lb), SpO2 100 %.Body mass index is 32.27 kg/m.  See Suicide Risk assessment     Have you used any form of tobacco in the last 30 days? (Cigarettes, Smokeless Tobacco, Cigars, and/or Pipes): No  Has this patient used any form of tobacco in the last 30 days? (Cigarettes, Smokeless Tobacco, Cigars, and/or Pipes) Yes, No  Blood Alcohol level:  Lab Results  Component Value Date   ETH <10 61/95/0932    Metabolic Disorder Labs:  Lab Results  Component Value Date   HGBA1C 11.4 (H) 07/12/2017   MPG 280.48 07/12/2017   MPG 283.35 07/11/2017   No results found for: PROLACTIN Lab Results  Component Value Date   CHOL 143 07/12/2017   TRIG 121 07/12/2017   HDL 35 (L) 07/12/2017   CHOLHDL 4.1 07/12/2017   VLDL 24 07/12/2017   [place] 84 07/12/2017    See Psychiatric Specialty Exam and Suicide Risk Assessment completed by Attending Physician prior to discharge.  Discharge destination:  Home  Is patient on multiple antipsychotic therapies at discharge:  No   Has Patient had three or more failed trials of antipsychotic monotherapy by history:  No  Recommended Plan for Multiple Antipsychotic Therapies: NA  Discharge Instructions    Increase activity slowly    Complete by:  As directed      Allergies as of 07/18/2017   No Known Allergies     Medication List    STOP taking these medications   cephALEXin 500 MG capsule Commonly known as:  KEFLEX   cyclobenzaprine 10 MG tablet Commonly known as:  FLEXERIL   diazepam 2 MG tablet Commonly known as:  VALIUM   guaiFENesin-codeine 100-10 MG/5ML syrup   HYDROcodone-acetaminophen 5-325 MG tablet Commonly known as:  NORCO/VICODIN   insulin detemir 100 UNIT/ML  injection Commonly known as:  LEVEMIR   lidocaine 5 % Commonly known as:  LIDODERM   metFORMIN 500 MG tablet Commonly known as:  GLUCOPHAGE   oxyCODONE-acetaminophen 5-325 MG tablet Commonly known as:  ROXICET   phenazopyridine 200 MG tablet Commonly known as:  PYRIDIUM   sulfamethoxazole-trimethoprim 800-160 MG tablet Commonly known as:  BACTRIM DS,SEPTRA DS   traMADol 50 MG tablet Commonly known as:  ULTRAM     TAKE these medications     Indication  FLUoxetine 40 MG capsule Commonly known as:  PROZAC Take 1 capsule (40 mg total) by mouth daily after breakfast.  Indication:  Depression   hydrochlorothiazide 25 MG tablet Commonly known as:  HYDRODIURIL Take 1 tablet (25 mg total) by mouth daily.  Indication:  High Blood Pressure Disorder   ibuprofen 600 MG tablet Commonly known as:  ADVIL,MOTRIN Take 1 tablet (600 mg total) by mouth every 8 (eight) hours as needed. What changed:  Another medication with the same name was removed. Continue taking this medication, and follow the directions you see here.  Indication:  pain  insulin aspart 100 UNIT/ML injection Commonly known as:  novoLOG Inject 4 Units into the skin 3 (three) times daily with meals. Use only if eat 50% of meal  Indication:  Type 2 Diabetes   insulin aspart 100 UNIT/ML injection Commonly known as:  novoLOG Inject 0-15 Units into the skin 3 (three) times daily with meals.  Indication:  Type 2 Diabetes   insulin aspart 100 UNIT/ML injection Commonly known as:  novoLOG Inject 0-5 Units into the skin at bedtime.  Indication:  Type 2 Diabetes   insulin glargine 100 UNIT/ML injection Commonly known as:  LANTUS Inject 0.3 mLs (30 Units total) into the skin at bedtime.  Indication:  Type 2 Diabetes   lisinopril 40 MG tablet Commonly known as:  PRINIVIL,ZESTRIL Take 1 tablet (40 mg total) by mouth daily.  Indication:  High Blood Pressure Disorder   risperiDONE 0.5 MG tablet Commonly known as:   RISPERDAL Take 1 tablet (0.5 mg total) by mouth at bedtime.  Indication:  Major Depressive Disorder   traZODone 50 MG tablet Commonly known as:  DESYREL Take 1 tablet (50 mg total) by mouth at bedtime as needed for sleep.  Indication:  [place] Follow up on 07/19/2017.   Why:  Sherrian Divers, Peer Support Services, will pick you up at your house and bring you to your appointment on Wednesday, 07/19/17, at 7:00am.  Please bring a copy of your hospital discharge paperwork. Contact information: [place] [address] [place], [place]. Go on 07/20/2017.   Specialty:  General Practice Why:  You have an appointment with your provider Tessa S. at 9:00 am. Please bring your discharge paperwork with you as well as an updated list of your medications.  Contact information: [place] [address] [phone]           Follow-up recommendations: Follow up with [facility]    Signed: Marylin Crosby, MD 07/18/2017, 2:46 PM

## 2017-07-18 NOTE — [facility] Group Notes (Signed)
[facility] LCSW Group Therapy Note  Date/Time: 07/18/17, 1400  Type of Therapy/Topic:  Group Therapy:  Feelings about Diagnosis  Participation Level:  Active   Mood: pleasant   Description of Group:    This group will allow patients to explore their thoughts and feelings about diagnoses they have received. Patients will be guided to explore their level of understanding and acceptance of these diagnoses. Facilitator will encourage patients to process their thoughts and feelings about the reactions of others to their diagnosis, and will guide patients in identifying ways to discuss their diagnosis with significant others in their lives. This group will be process-oriented, with patients participating in exploration of their own experiences as well as giving and receiving support and challenge from other group members.   Therapeutic Goals: 1. Patient will demonstrate understanding of diagnosis as evidence by identifying two or more symptoms of the disorder:  2. Patient will be able to express two feelings regarding the diagnosis 3. Patient will demonstrate ability to communicate their needs through discussion and/or role plays  Summary of Patient Progress:Pt was aware that her diagnosis was major depression and was able to share several symptoms that she experiences as part of her depression.  Pt made several comments in group discussion.  Good participation overall.        Therapeutic Modalities:   Cognitive Behavioral Therapy Brief Therapy Feelings Identification   Daleen SquibbGreg Bonnie Roig, LCSW

## 2017-07-18 NOTE — Progress Notes (Signed)
Received Kelly [name] last night, he BS was 196, afterwards she received and self administered her Lantus 30 units without incident. She had several questions about her diet to keep her BS WNL. She was recommended to talk with the dietitian and attend a diabetic class. She slept throughout the night for 5 hrs and 45 min.

## 2017-07-18 NOTE — Progress Notes (Signed)
Patient up ad lib with steady gait. Alert and oriented x 4. Verbally denies SI/HI/AVH. Patient is scheduled to be discharged today. Compliant with meds and meals. Attends group and participates actively. Milieu remains safe. Will continue to monitor.

## 2017-07-18 NOTE — Progress Notes (Signed)
Inpatient Diabetes Program Recommendations  AACE/ADA: New Consensus Statement on Inpatient Glycemic Control (2015)  Target Ranges:  Prepandial:   less than 140 mg/dL      Peak postprandial:   less than 180 mg/dL (1-2 hours)      Critically ill patients:  140 - 180 mg/dL   Results for Markham JordanBENTON, [facility] (MRN [id number]) as of 07/18/2017 08:23  Ref. Range 07/17/2017 06:47 07/17/2017 11:15 07/17/2017 16:29 07/17/2017 21:13 07/18/2017 07:03  Glucose-Capillary Latest Ref Range: 65 - 99 mg/dL 518223 (H) 841282 (H) 660226 (H) 196 (H) 226 (H)  Results for Markham JordanBENTON, Mailyn [facility] (MRN [id number]) as of 07/18/2017 08:23  Ref. Range 07/11/2017 16:27 07/12/2017 07:20  Hemoglobin A1C Latest Ref Range: 4.8 - 5.6 % 11.5 (H) 11.4 (H)   Review of Glycemic Control  Diabetes history: DM2 Outpatient Diabetes medications: Levemir QHS (no dose noted in chart and patient is not sure), Metformin 500 mg BID Current orders for Inpatient glycemic control:Lantus 30 units QHS, Novolog 0-15 units TID with meals, Novolog 0-5 units QHS, Novolog 4 units TID with meals for meal coverage  Inpatient Diabetes Program Recommendations:  Insulin - Basal: Please consider increasing Lantus to 33 units QHS. Insulin - Meal Coverage: Please consider increasing meal coverage to Novolog 6 units TID with meals if patient eats at least 50% of meals. HgbA1C: A1C 11.4% on 07/12/17 indicating an average glucose of 280 mg/dl over the past 2-3 months. Recommend patient follow up with PCP/Endocrinologist to improve DM control.  Thanks, Orlando PennerMarie Brea Coleson, RN, MSN, CDE Diabetes Coordinator Inpatient Diabetes Program [phone] (Team Pager from 8am to 5pm)

## 2017-07-18 NOTE — Progress Notes (Signed)
Patient discharged from unit on above date and time. Picked up by boyfriend and transported home. Denies HI/SI/AVH. Seven day supply of medications, RX's and personal care items in hand upon discharge. No complaints voiced.

## 2017-07-18 NOTE — Progress Notes (Signed)
Recreation Therapy Notes  Date: 10.30.18  Time: 9:30am  Location: Craft Room  Behavioral response: Appropriate  Intervention Topic: Team building  Discussion/Intervention: Group content on today was focused on team building. The group identified what team building is. Individuals described who is a part of their team. Patients expressed why they thought team building is important. The group stated reasons why they thought it was easier to work with a Comptrollersmaller/larger team. Individuals discussed some positives and negatives of working with a team. Patients gave examples of past experiences they had while working with a team. The group participated in the intervention "Save the [place] Lakeshore[place]alls", patients were in groups and had to keep the balls on the surface given.   Clinical Observations/Feedback:  Patient came to group and was actively engaged with her peers and staff. Individual described team building as working together. She expressed that she like to work in smaller group because it is less people.   [name] LRT/CTRS        Leilynn Pilat 07/18/2017 12:02 PM

## 2017-07-18 NOTE — Progress Notes (Signed)
  [facility] :  Will you be returning to the same living situation after discharge:  Yes,  own home. At discharge, do you have transportation home?: Yes,  friend Do you have the ability to pay for your medications: Yes,  medicaid  Release of information consent forms completed and in the chart;  Patient's signature needed at discharge.  Patient to Follow up at: Follow-up Information    [facility] Follow up on 07/19/2017.   Why:  Unk PintoHarvey Bryant, Peer Support Services, will pick you up at your house and bring you to your appointment on Wednesday, 07/19/17, at 7:00am.  Please bring a copy of your hospital discharge paperwork. Contact information: [address] [place] [address] [phone]        [facility]. Go on 07/20/2017.   Specialty:  General Practice Why:  You have an appointment with your provider Tessa S. at 9:00 am. Please bring your discharge paperwork with you as well as an updated list of your medications.  Contact information: [address] [place] [address] [phone]           Next level of care provider [facility] Link:no  Safety Planning and Suicide Prevention discussed: Yes,  with daughter  Have you used any form of tobacco in the last 30 days? (Cigarettes, Smokeless Tobacco, Cigars, and/or Pipes): No  Has patient been referred to the Quitline?: N/A patient is not a smoker  Patient has been referred for addiction treatment: Yes  Lorri FrederickWierda, Taquan Bralley Jon, LCSW 07/18/2017, 10:19 AM

## 2017-08-10 ENCOUNTER — Other Ambulatory Visit: Payer: Self-pay

## 2017-08-10 ENCOUNTER — Encounter: Payer: Self-pay | Admitting: Emergency Medicine

## 2017-08-10 ENCOUNTER — Emergency Department
Admission: EM | Admit: 2017-08-10 | Discharge: 2017-08-10 | Disposition: A | Discharge status: Home / Self Care | Payer: Medicaid Other | Attending: Emergency Medicine | Admitting: Emergency Medicine

## 2017-08-10 DIAGNOSIS — I10 Essential (primary) hypertension: Secondary | ICD-10-CM | POA: Insufficient documentation

## 2017-08-10 DIAGNOSIS — R112 Nausea with vomiting, unspecified: Secondary | ICD-10-CM | POA: Insufficient documentation

## 2017-08-10 DIAGNOSIS — E119 Type 2 diabetes mellitus without complications: Secondary | ICD-10-CM | POA: Insufficient documentation

## 2017-08-10 DIAGNOSIS — N390 Urinary tract infection, site not specified: Secondary | ICD-10-CM | POA: Insufficient documentation

## 2017-08-10 DIAGNOSIS — J029 Acute pharyngitis, unspecified: Secondary | ICD-10-CM | POA: Diagnosis not present

## 2017-08-10 DIAGNOSIS — Z79899 Other long term (current) drug therapy: Secondary | ICD-10-CM | POA: Diagnosis not present

## 2017-08-10 DIAGNOSIS — Z794 Long term (current) use of insulin: Secondary | ICD-10-CM | POA: Diagnosis not present

## 2017-08-10 LAB — COMPREHENSIVE METABOLIC PANEL
ALT: 40 U/L (ref 14–54)
AST: 37 U/L (ref 15–41)
Albumin: 3.4 g/dL — ABNORMAL LOW (ref 3.5–5.0)
Alkaline Phosphatase: 74 U/L (ref 38–126)
Anion gap: 12 (ref 5–15)
BUN: 6 mg/dL (ref 6–20)
CHLORIDE: 102 mmol/L (ref 101–111)
CO2: 25 mmol/L (ref 22–32)
Calcium: 9.2 mg/dL (ref 8.9–10.3)
Creatinine, Ser: 1.04 mg/dL — ABNORMAL HIGH (ref 0.44–1.00)
Glucose, Bld: 323 mg/dL — ABNORMAL HIGH (ref 65–99)
POTASSIUM: 3.3 mmol/L — AB (ref 3.5–5.1)
SODIUM: 139 mmol/L (ref 135–145)
Total Bilirubin: 0.6 mg/dL (ref 0.3–1.2)
Total Protein: 7.9 g/dL (ref 6.5–8.1)

## 2017-08-10 LAB — URINALYSIS, COMPLETE (UACMP) WITH MICROSCOPIC
BILIRUBIN URINE: NEGATIVE
Glucose, UA: >=500 mg/dL — AB
Hgb urine dipstick: NEGATIVE
KETONES UR: NEGATIVE mg/dL
Nitrite: NEGATIVE
PH: 6 (ref 5.0–8.0)
Specific Gravity, Urine: 1.023 (ref 1.005–1.030)

## 2017-08-10 LAB — POCT RAPID STREP A: Streptococcus, Group A Screen (Direct): NEGATIVE

## 2017-08-10 LAB — LIPASE, BLOOD: LIPASE: 24 U/L (ref 11–51)

## 2017-08-10 LAB — CBC
HEMATOCRIT: 40.7 % (ref 35.0–47.0)
HEMOGLOBIN: 13.4 g/dL (ref 12.0–16.0)
MCH: 27.2 pg (ref 26.0–34.0)
MCHC: 32.8 g/dL (ref 32.0–36.0)
MCV: 83 fL (ref 80.0–100.0)
Platelets: 220 10*3/uL (ref 150–440)
RBC: 4.9 MIL/uL (ref 3.80–5.20)
RDW: 13.9 % (ref 11.5–14.5)
WBC: 5.6 10*3/uL (ref 3.6–11.0)

## 2017-08-10 MED ORDER — ONDANSETRON HCL 4 MG PO TABS: 4.0000 mg | ORAL_TABLET | Freq: Three times a day (TID) | ORAL | 0 refills | Status: DC | PRN

## 2017-08-10 MED ORDER — ONDANSETRON 4 MG PO TBDP
4.0000 mg | ORAL_TABLET | Freq: Once | ORAL | Status: AC | PRN
  Administered 2017-08-10: 4 mg via ORAL
  Filled 2017-08-10: qty 1

## 2017-08-10 MED ORDER — NITROFURANTOIN MONOHYD MACRO 100 MG PO CAPS: 100.0000 mg | ORAL_CAPSULE | Freq: Two times a day (BID) | ORAL | 0 refills | Status: AC

## 2017-08-10 NOTE — Discharge Instructions (Signed)
Please seek medical attention for any high fevers, chest pain, shortness of breath, change in behavior, persistent vomiting, bloody stool or any other new or concerning symptoms.  

## 2017-08-10 NOTE — ED Provider Notes (Signed)
[facility] Emergency Department Provider Note  ____________________________________________   I have reviewed the triage vital signs and the nursing notes.   HISTORY  Chief Complaint Emesis and Sore Throat   History limited by: Not Limited   HPI Kelly [name] is a 46 y.o. female who presents to the emergency department today because of concern for sore throat and emesis.   LOCATION:bilateral tonsils DURATION:roughly 1 week TIMING: constant SEVERITY: severe CONTEXT: patient denies any sick contacts. States symptoms have been present for roughly 1 week. Started with the sore throat and then developed into emesis.  MODIFYING FACTORS: none identified ASSOCIATED SYMPTOMS: emesis, diarrhea.  Per medical record review patient has a history of DM, HTN  Past Medical History:  Diagnosis Date  . Diabetes mellitus without complication ([facility])   . Hypertension     Patient Active Problem List   Diagnosis Date Noted  . Severe recurrent major depression with psychotic features ([facility]) 07/11/2017  . Diabetes mellitus without complication ([facility]) 07/11/2017  . Hypertension 07/11/2017    Past Surgical History:  Procedure Laterality Date  . ABDOMINAL HYSTERECTOMY      Prior to Admission medications   Medication Sig Start Date End Date Taking? Authorizing Provider  FLUoxetine (PROZAC) 40 MG capsule Take 1 capsule (40 mg total) by mouth daily after breakfast. 07/18/17   McNew, Ileene HutchinsonHolly R, MD  hydrochlorothiazide (HYDRODIURIL) 25 MG tablet Take 1 tablet (25 mg total) by mouth daily. 07/17/17   McNew, Ileene HutchinsonHolly R, MD  ibuprofen (ADVIL,MOTRIN) 600 MG tablet Take 1 tablet (600 mg total) by mouth every 8 (eight) hours as needed. 10/25/16   Joni ReiningSmith, Ronald K, PA-C  insulin aspart (NOVOLOG) 100 UNIT/ML injection Inject 4 Units into the skin 3 (three) times daily with meals. Use only if eat 50% of meal 07/17/17 08/16/17  McNew, Ileene HutchinsonHolly R, MD  insulin aspart (NOVOLOG) 100 UNIT/ML  injection Inject 0-15 Units into the skin 3 (three) times daily with meals. 07/17/17 08/16/17  McNew, Ileene HutchinsonHolly R, MD  insulin aspart (NOVOLOG) 100 UNIT/ML injection Inject 0-5 Units into the skin at bedtime. 07/17/17   McNew, Ileene HutchinsonHolly R, MD  insulin glargine (LANTUS) 100 UNIT/ML injection Inject 0.3 mLs (30 Units total) into the skin at bedtime. 07/17/17 08/16/17  McNew, Ileene HutchinsonHolly R, MD  lisinopril (PRINIVIL,ZESTRIL) 40 MG tablet Take 1 tablet (40 mg total) by mouth daily. 07/17/17 08/16/17  McNew, Ileene HutchinsonHolly R, MD  risperiDONE (RISPERDAL) 0.5 MG tablet Take 1 tablet (0.5 mg total) by mouth at bedtime. 07/17/17   McNew, Ileene HutchinsonHolly R, MD  traZODone (DESYREL) 50 MG tablet Take 1 tablet (50 mg total) by mouth at bedtime as needed for sleep. 07/17/17   McNew, Ileene HutchinsonHolly R, MD    Allergies Patient has no known allergies.  History reviewed. No pertinent family history.  Social History Social History   Tobacco Use  . Smoking status: Never Smoker  . Smokeless tobacco: Never Used  Substance Use Topics  . Alcohol use: No  . Drug use: No    Review of Systems Constitutional: No fever/chills Eyes: No visual changes. ENT: Positive for sore throat.  Cardiovascular: Denies chest pain. Respiratory: Denies shortness of breath. Gastrointestinal: Positive for abdominal pain, nausea, vomiting and diarrhea. Genitourinary: Negative for dysuria. Musculoskeletal: Negative for back pain. Skin: Negative for rash. Neurological: Negative for headaches, focal weakness or numbness.  ____________________________________________   PHYSICAL EXAM:  VITAL SIGNS: ED Triage Vitals  Enc Vitals Group     BP 08/10/17 1111 124/81  Pulse Rate 08/10/17 1111 95     Resp 08/10/17 1111 18     Temp 08/10/17 1111 99.4 F (37.4 C)     Temp Source 08/10/17 1111 Oral     SpO2 08/10/17 1111 98 %     Weight 08/10/17 1112 180 lb (81.6 kg)     Height 08/10/17 1112 5\' 5"  (1.651 m)     Head Circumference --      Peak Flow --      Pain Score  08/10/17 1111 10   Constitutional: Alert and oriented. Well appearing and in no distress. Eyes: Conjunctivae are normal.  ENT   Head: Normocephalic and atraumatic.   Nose: No congestion/rhinnorhea.   Mouth/Throat: Mucous membranes are moist. Small to moderate amount of tonsillar swelling. Bilateral. No uvula deviation.    Neck: No stridor. Hematological/Lymphatic/Immunilogical: No cervical lymphadenopathy. Cardiovascular: Normal rate, regular rhythm.  No murmurs, rubs, or gallops.  Respiratory: Normal respiratory effort without tachypnea nor retractions. Breath sounds are clear and equal bilaterally. No wheezes/rales/rhonchi. Gastrointestinal: Soft and non tender. No rebound. No guarding.  Genitourinary: Deferred Musculoskeletal: Normal range of motion in all extremities. No lower extremity edema. Neurologic:  Normal speech and language. No gross focal neurologic deficits are appreciated.  Skin:  Skin is warm, dry and intact. No rash noted. Psychiatric: Mood and affect are normal. Speech and behavior are normal. Patient exhibits appropriate insight and judgment.  ____________________________________________    LABS (pertinent positives/negatives)  UA consistent with infection Strep negative Lipase 24 CMP glu 323 CBC wnl ____________________________________________   EKG  None  ____________________________________________    RADIOLOGY  None  ____________________________________________   PROCEDURES  Procedures  ____________________________________________   INITIAL IMPRESSION / ASSESSMENT AND PLAN / ED COURSE  Pertinent labs & imaging results that were available during my care of the patient were reviewed by me and considered in my medical decision making (see chart for details).  Patient presents for concern for sore throat as well as nausea and vomiting. In terms of sore throat concern for strep vs viral vs other bacterial etiology. Nausea and  vomiting could be related to that or possible SBO, UTI, intraabdominal infection, gastroenteritis vs other etiologies. Work up is consistent with UTI. Will plan on treating with antibiotics. Discussed findings and plan with patient.   ____________________________________________   FINAL CLINICAL IMPRESSION(S) / ED DIAGNOSES  Final diagnoses:  Pharyngitis, unspecified etiology  Lower urinary tract infection  Nausea and vomiting, intractability of vomiting not specified, unspecified vomiting type     Note: This dictation was prepared with Dragon dictation. Any transcriptional errors that result from this process are unintentional     Phineas SemenGoodman, Jennyfer Nickolson, MD 08/10/17 [phone]

## 2017-08-10 NOTE — ED Triage Notes (Addendum)
C/o NVD for 1 week.  Also has had sore throat.  Denies abdominal pain at this time just the diarrhea and vomiting.  Reports fever earlier in week but none recently.  Tonsils significantly enlarged with redness/mild exudate.  Handling secretions. Reports vomit is like green bile. Feels like has stomach bug

## 2017-12-10 IMAGING — CR DG CHEST 2V
1 series · 2 of 2 positions shown · non-contrast
Comparison: 11/02/2016

CLINICAL DATA: Left-sided chest pain

EXAM:
CHEST  2 VIEW

[Series 1: w chest pa · 0.14mm/px · 2 of 2 slices shown]
[im 1/2]
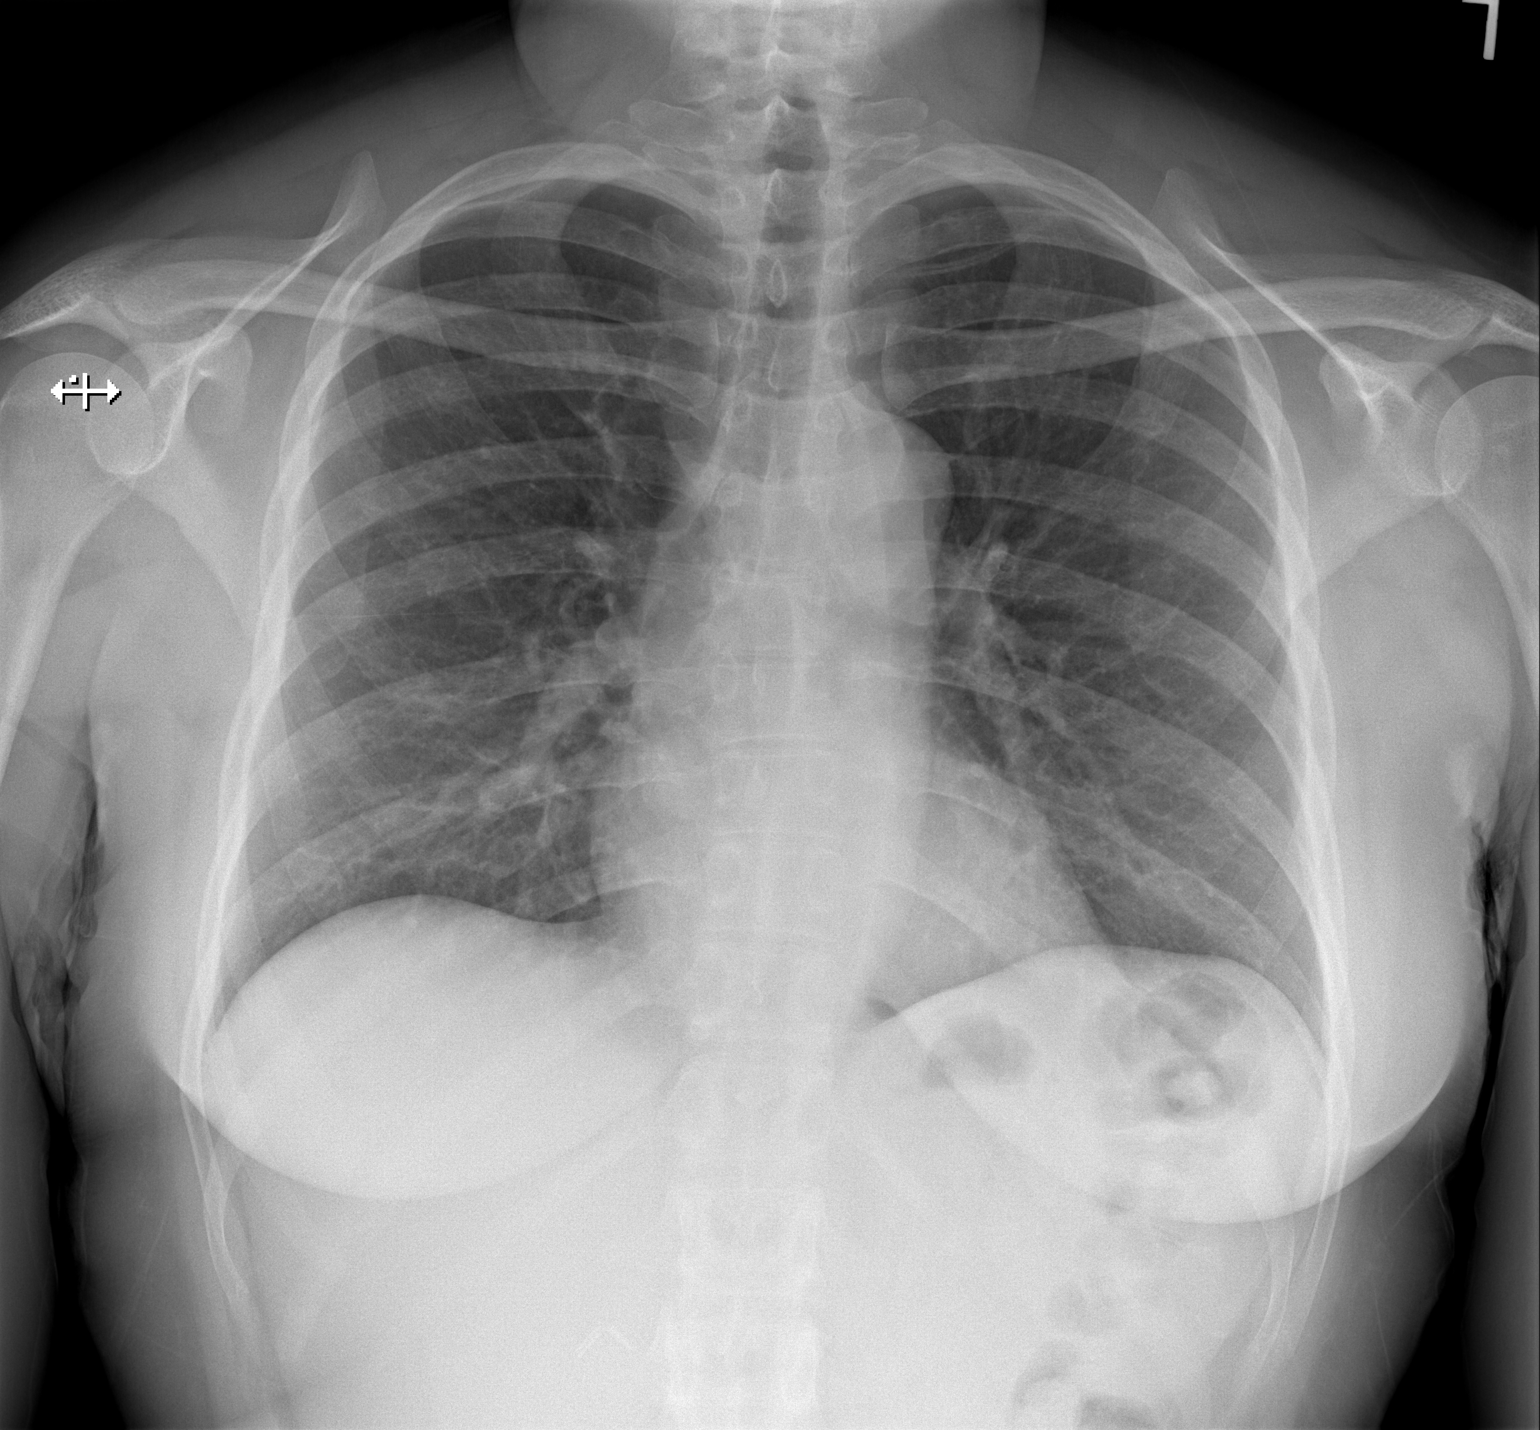
[im 2/2]
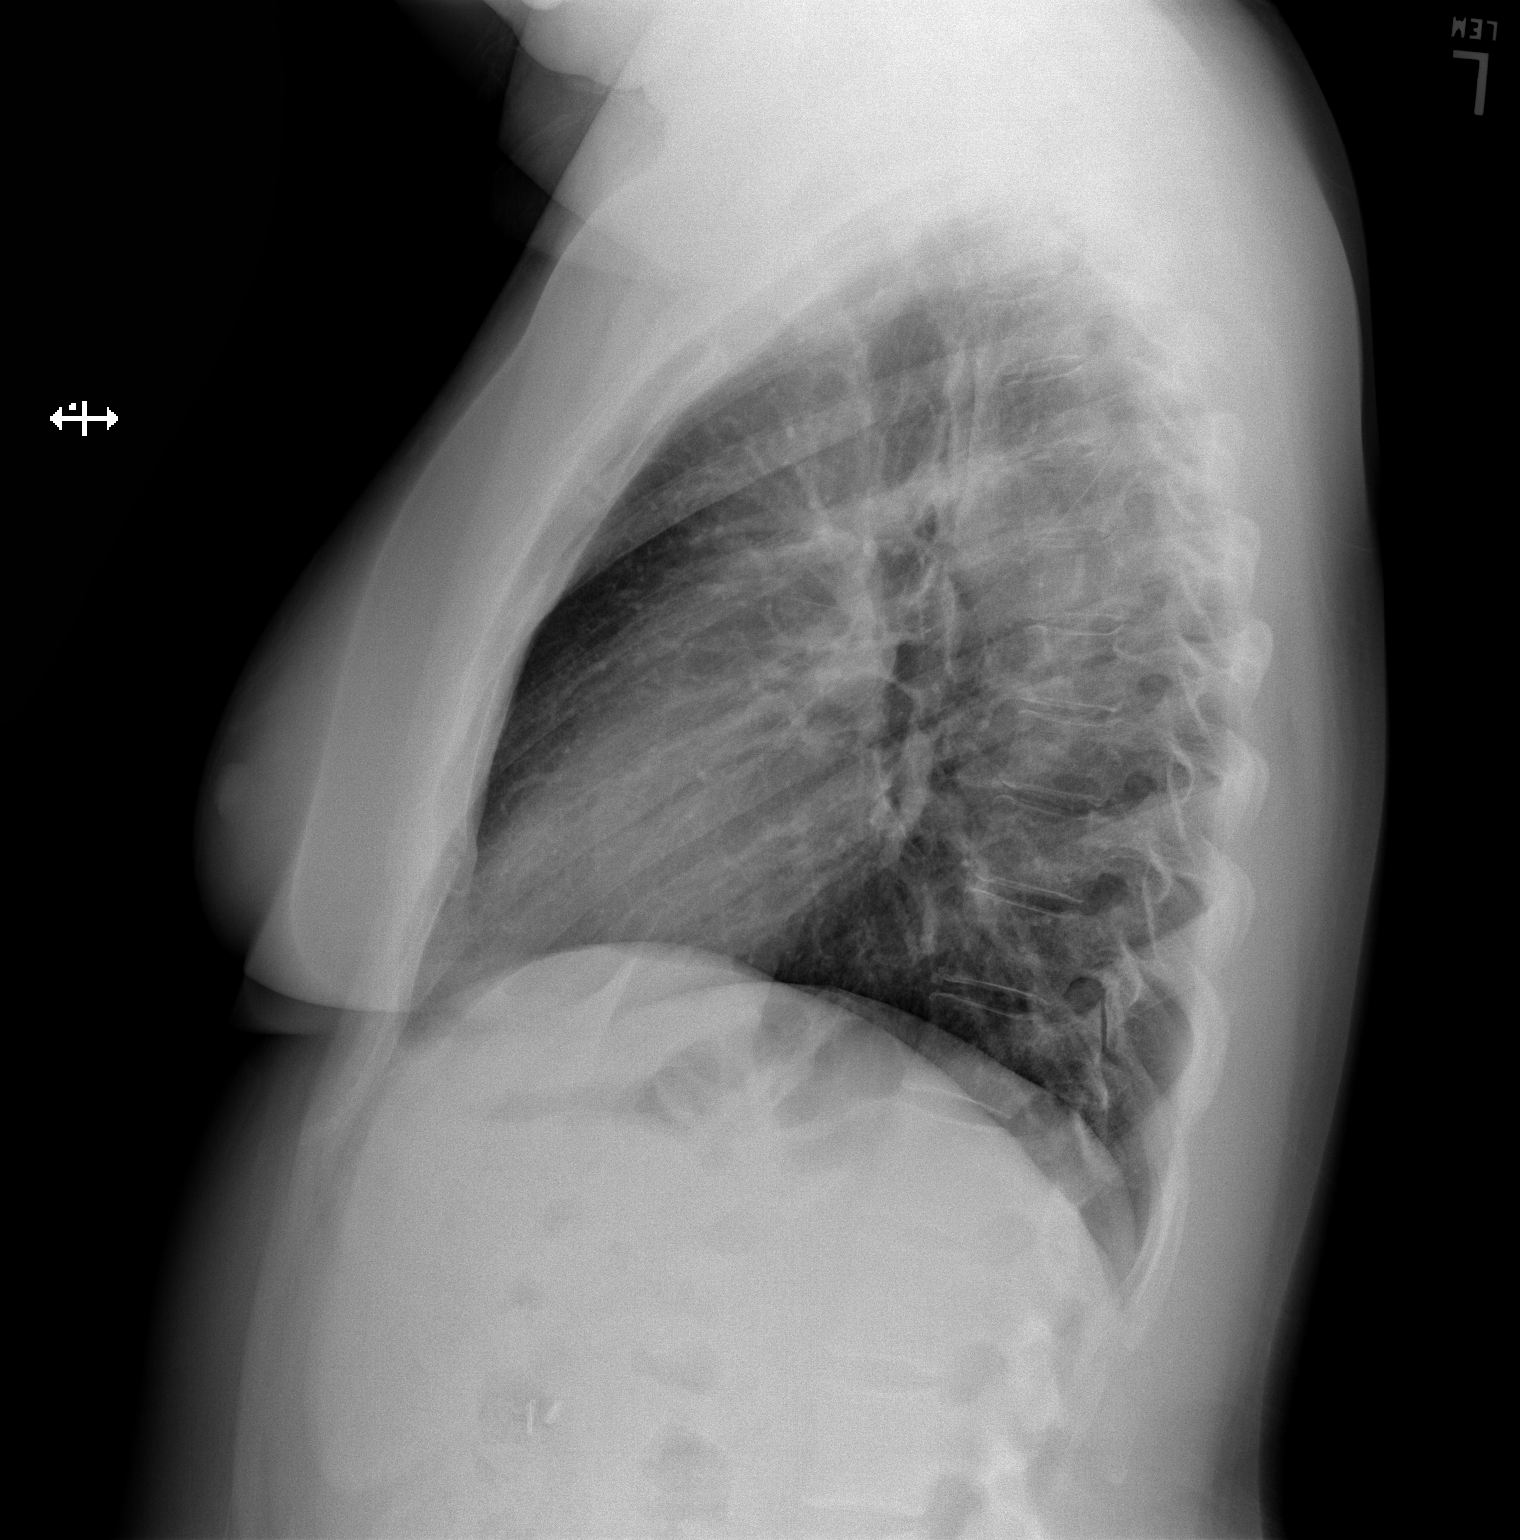

[2 of 2 positions shown; findings below may reference images not displayed]

FINDINGS: The heart size and mediastinal contours are within normal limits.
Both lungs are clear. The visualized skeletal structures are
unremarkable. Surgical clips in the right upper quadrant.
IMPRESSION: No active cardiopulmonary disease.

## 2018-04-21 ENCOUNTER — Other Ambulatory Visit: Payer: Self-pay

## 2018-04-21 ENCOUNTER — Emergency Department: Payer: Medicaid Other

## 2018-04-21 ENCOUNTER — Emergency Department
Admission: EM | Admit: 2018-04-21 | Discharge: 2018-04-21 | Disposition: A | Discharge status: Home / Self Care | Payer: Medicaid Other | Attending: Emergency Medicine | Admitting: Emergency Medicine

## 2018-04-21 DIAGNOSIS — S93491A Sprain of other ligament of right ankle, initial encounter: Secondary | ICD-10-CM

## 2018-04-21 DIAGNOSIS — Z79899 Other long term (current) drug therapy: Secondary | ICD-10-CM | POA: Diagnosis not present

## 2018-04-21 DIAGNOSIS — S93431A Sprain of tibiofibular ligament of right ankle, initial encounter: Secondary | ICD-10-CM | POA: Insufficient documentation

## 2018-04-21 DIAGNOSIS — Y939 Activity, unspecified: Secondary | ICD-10-CM | POA: Diagnosis not present

## 2018-04-21 DIAGNOSIS — W108XXA Fall (on) (from) other stairs and steps, initial encounter: Secondary | ICD-10-CM | POA: Diagnosis not present

## 2018-04-21 DIAGNOSIS — Y999 Unspecified external cause status: Secondary | ICD-10-CM | POA: Diagnosis not present

## 2018-04-21 DIAGNOSIS — Z794 Long term (current) use of insulin: Secondary | ICD-10-CM | POA: Insufficient documentation

## 2018-04-21 DIAGNOSIS — Y929 Unspecified place or not applicable: Secondary | ICD-10-CM | POA: Diagnosis not present

## 2018-04-21 DIAGNOSIS — S99911A Unspecified injury of right ankle, initial encounter: Secondary | ICD-10-CM | POA: Diagnosis present

## 2018-04-21 DIAGNOSIS — I10 Essential (primary) hypertension: Secondary | ICD-10-CM | POA: Insufficient documentation

## 2018-04-21 DIAGNOSIS — E119 Type 2 diabetes mellitus without complications: Secondary | ICD-10-CM | POA: Insufficient documentation

## 2018-04-21 MED ORDER — HYDROCODONE-ACETAMINOPHEN 5-325 MG PO TABS
1.0000 | ORAL_TABLET | Freq: Once | ORAL | Status: AC
  Administered 2018-04-21: 1 via ORAL
  Filled 2018-04-21: qty 1

## 2018-04-21 MED ORDER — HYDROCODONE-ACETAMINOPHEN 5-325 MG PO TABS: 1.0000 | ORAL_TABLET | Freq: Four times a day (QID) | ORAL | 0 refills | Status: DC | PRN

## 2018-04-21 MED ORDER — IBUPROFEN 600 MG PO TABS: 600.0000 mg | ORAL_TABLET | Freq: Three times a day (TID) | ORAL | 0 refills | Status: DC | PRN

## 2018-04-21 MED ORDER — IBUPROFEN 600 MG PO TABS
600.0000 mg | ORAL_TABLET | Freq: Once | ORAL | Status: AC
  Administered 2018-04-21: 600 mg via ORAL
  Filled 2018-04-21: qty 1

## 2018-04-21 NOTE — Discharge Instructions (Signed)
It was a pleasure to take care of you today, and thank you for coming to our emergency department.  If you have any questions or concerns before leaving please ask the nurse to grab me and I'm more than happy to go through your aftercare instructions again.  If you were prescribed any opioid pain medication today such as Norco, Vicodin, Percocet, morphine, hydrocodone, or oxycodone please make sure you do not drive when you are taking this medication as it can alter your ability to drive safely.  If you have any concerns once you are home that you are not improving or are in fact getting worse before you can make it to your follow-up appointment, please do not hesitate to call 911 and come back for further evaluation.  Merrily BrittleNeil Dietrick Barris, MD  Results for orders placed or performed during the hospital encounter of 08/10/17  Lipase, blood  Result Value Ref Range   Lipase 24 11 - 51 U/L  Comprehensive metabolic panel  Result Value Ref Range   Sodium 139 135 - 145 mmol/L   Potassium 3.3 (L) 3.5 - 5.1 mmol/L   Chloride 102 101 - 111 mmol/L   CO2 25 22 - 32 mmol/L   Glucose, Bld 323 (H) 65 - 99 mg/dL   BUN 6 6 - 20 mg/dL   Creatinine, Ser 4.091.04 (H) 0.44 - 1.00 mg/dL   Calcium 9.2 8.9 - 81.110.3 mg/dL   Total Protein 7.9 6.5 - 8.1 g/dL   Albumin 3.4 (L) 3.5 - 5.0 g/dL   AST 37 15 - 41 U/L   ALT 40 14 - 54 U/L   Alkaline Phosphatase 74 38 - 126 U/L   Total Bilirubin 0.6 0.3 - 1.2 mg/dL   GFR calc non Af Amer >60 >60 mL/min   GFR calc Af Amer >60 >60 mL/min   Anion gap 12 5 - 15  CBC  Result Value Ref Range   WBC 5.6 3.6 - 11.0 K/uL   RBC 4.90 3.80 - 5.20 MIL/uL   Hemoglobin 13.4 12.0 - 16.0 g/dL   HCT 91.440.7 78.235.0 - 95.647.0 %   MCV 83.0 80.0 - 100.0 fL   MCH 27.2 26.0 - 34.0 pg   MCHC 32.8 32.0 - 36.0 g/dL   RDW 21.313.9 08.611.5 - 57.814.5 %   Platelets 220 150 - 440 K/uL  Urinalysis, Complete w Microscopic  Result Value Ref Range   Color, Urine AMBER (A) YELLOW   APPearance HAZY (A) CLEAR   Specific  Gravity, Urine 1.023 1.005 - 1.030   pH 6.0 5.0 - 8.0   Glucose, UA >=500 (A) NEGATIVE mg/dL   Hgb urine dipstick NEGATIVE NEGATIVE   Bilirubin Urine NEGATIVE NEGATIVE   Ketones, ur NEGATIVE NEGATIVE mg/dL   Protein, ur >=469>=300 (A) NEGATIVE mg/dL   Nitrite NEGATIVE NEGATIVE   Leukocytes, UA TRACE (A) NEGATIVE   RBC / HPF 0-5 0 - 5 RBC/hpf   WBC, UA TOO NUMEROUS TO COUNT 0 - 5 WBC/hpf   Bacteria, UA RARE (A) NONE SEEN   Squamous Epithelial / LPF 0-5 (A) NONE SEEN   Mucus PRESENT   POCT rapid strep A [facility]  Result Value Ref Range   Streptococcus, Group A Screen (Direct) NEGATIVE NEGATIVE   Dg Ankle Complete Left  Result Date: 04/21/2018 CLINICAL DATA:  Left ankle pain after twisting injury going down porch steps. EXAM: LEFT ANKLE COMPLETE - 3+ VIEW COMPARISON:  Radiograph 08/23/2015 FINDINGS: There is no evidence of fracture, dislocation, or joint  effusion. Ankle mortise is preserved. There is no evidence of arthropathy or other focal bone abnormality. Mild soft tissue edema. IMPRESSION: Mild soft tissue edema without acute fracture or subluxation of the left ankle. Electronically Signed   By: Rubye Oaks M.D.   On: 04/21/2018 01:18

## 2018-04-21 NOTE — ED Provider Notes (Signed)
[facility] Emergency Department Provider Note  ____________________________________________   First MD Initiated Contact with Patient 04/21/18 0402     (approximate)  I have reviewed the triage vital signs and the nursing notes.   HISTORY  Chief Complaint Ankle Pain   HPI Kelly [name] is a 47 y.o. female who self presents to the emergency department with sudden onset moderate severity left lateral ankle pain after she rolled her ankle stepping down 2 steps.  She has been able to bear weight.  No numbness or weakness.  No knee pain.  Her pain is worse with ambulation somewhat improved with rest.    Past Medical History:  Diagnosis Date  . Diabetes mellitus without complication (HCC)   . Hypertension     Patient Active Problem List   Diagnosis Date Noted  . Severe recurrent major depression with psychotic features (HCC) 07/11/2017  . Diabetes mellitus without complication (HCC) 07/11/2017  . Hypertension 07/11/2017    Past Surgical History:  Procedure Laterality Date  . ABDOMINAL HYSTERECTOMY      Prior to Admission medications   Medication Sig Start Date End Date Taking? Authorizing Provider  FLUoxetine (PROZAC) 40 MG capsule Take 1 capsule (40 mg total) by mouth daily after breakfast. 07/18/17   McNew, Ileene Hutchinson, MD  hydrochlorothiazide (HYDRODIURIL) 25 MG tablet Take 1 tablet (25 mg total) by mouth daily. 07/17/17   McNew, Ileene Hutchinson, MD  HYDROcodone-acetaminophen (NORCO) 5-325 MG tablet Take 1 tablet by mouth every 6 (six) hours as needed for up to 7 doses for severe pain. 04/21/18   Merrily Brittle, MD  ibuprofen (ADVIL,MOTRIN) 600 MG tablet Take 1 tablet (600 mg total) by mouth every 8 (eight) hours as needed. 04/21/18   Merrily Brittle, MD  insulin aspart (NOVOLOG) 100 UNIT/ML injection Inject 4 Units into the skin 3 (three) times daily with meals. Use only if eat 50% of meal 07/17/17 08/16/17  McNew, Ileene Hutchinson, MD  insulin aspart (NOVOLOG) 100  UNIT/ML injection Inject 0-15 Units into the skin 3 (three) times daily with meals. 07/17/17 08/16/17  McNew, Ileene Hutchinson, MD  insulin aspart (NOVOLOG) 100 UNIT/ML injection Inject 0-5 Units into the skin at bedtime. 07/17/17   McNew, Ileene Hutchinson, MD  insulin glargine (LANTUS) 100 UNIT/ML injection Inject 0.3 mLs (30 Units total) into the skin at bedtime. 07/17/17 08/16/17  McNew, Ileene Hutchinson, MD  lisinopril (PRINIVIL,ZESTRIL) 40 MG tablet Take 1 tablet (40 mg total) by mouth daily. 07/17/17 08/16/17  McNew, Ileene Hutchinson, MD  ondansetron (ZOFRAN) 4 MG tablet Take 1 tablet (4 mg total) by mouth every 8 (eight) hours as needed for nausea or vomiting. 08/10/17   Phineas Semen, MD  risperiDONE (RISPERDAL) 0.5 MG tablet Take 1 tablet (0.5 mg total) by mouth at bedtime. 07/17/17   McNew, Ileene Hutchinson, MD  traZODone (DESYREL) 50 MG tablet Take 1 tablet (50 mg total) by mouth at bedtime as needed for sleep. 07/17/17   McNew, Ileene Hutchinson, MD    Allergies Patient has no known allergies.  No family history on file.  Social History Social History   Tobacco Use  . Smoking status: Never Smoker  . Smokeless tobacco: Never Used  Substance Use Topics  . Alcohol use: No  . Drug use: No    Review of Systems Constitutional: No fever/chills Cardiovascular: Denies chest pain. Respiratory: Denies shortness of breath. Musculoskeletal: Positive for ankle pain Neurological: Negative for headaches   ____________________________________________   PHYSICAL EXAM:  VITAL SIGNS: ED  Triage Vitals [04/21/18 0043]  Enc Vitals Group     BP 127/75     Pulse Rate 95     Resp 16     Temp 99.1 F (37.3 C)     Temp Source Oral     SpO2 100 %     Weight 187 lb (84.8 kg)     Height 5\' 8"  (1.727 m)     Head Circumference      Peak Flow      Pain Score 9     Pain Loc      Pain Edu?      Excl. in GC?     Constitutional: Alert and oriented x4 appears mildly uncomfortable nontoxic no diaphoresis Head: Atraumatic. Nose: No  congestion/rhinnorhea. Mouth/Throat: No trismus Neck: No stridor.   Cardiovascular: Regular rate and rhythm Respiratory: Normal respiratory effort.  No retractions. MSK: No tenderness over medial malleolus or lateral malleolus or for 6 cm proximal No tenderness over navicular, midfoot, or fifth metatarsal Some swelling and tenderness over posterior tibial fibular ligament 2+ dorsalis pedis pulse Skin closed Compartments soft Patient can fire extensor hallucis longus, extensor digitorum longus, flexor hallucis longus, flexor digitorum longus, tibialis anterior, and gastrocnemius Sensation intact to light touch to sural, saphenous, deep peroneal, superficial peroneal, and tibial nerve  Neurologic:  Normal speech and language. No gross focal neurologic deficits are appreciated.  Skin:  Skin is warm, dry and intact. No rash noted.    ____________________________________________  LABS (all labs ordered are listed, but only abnormal results are displayed)  Labs Reviewed - No data to display   __________________________________________  EKG   ____________________________________________  RADIOLOGY  X-ray reviewed by me with no acute fracture or dislocation ____________________________________________   DIFFERENTIAL includes but not limited to  Ankle sprain, ankle fracture, ankle dislocation   PROCEDURES  Procedure(s) performed: no  Procedures  Critical Care performed: no  ____________________________________________   INITIAL IMPRESSION / ASSESSMENT AND PLAN / ED COURSE  Pertinent labs & imaging results that were available during my care of the patient were reviewed by me and considered in my medical decision making (see chart for details).   As part of my medical decision making, I reviewed the following data within the electronic MEDICAL RECORD NUMBER History obtained from family if available, nursing notes, old chart and ekg, as well as notes from prior ED  visits.  The patient is able to bear weight she is neurovascularly intact.  She is focally tender over PT FL and x-rays negative.  This is most consistent with ankle sprain.  Ace wrap with improvement in her symptoms and I will give her a short course of hydrocodone for breakthrough pain.      ____________________________________________   FINAL CLINICAL IMPRESSION(S) / ED DIAGNOSES  Final diagnoses:  Sprain of posterior talofibular ligament of right ankle, initial encounter      NEW MEDICATIONS STARTED DURING THIS VISIT:  Discharge Medication List as of 04/21/2018  4:57 AM    START taking these medications   Details  HYDROcodone-acetaminophen (NORCO) 5-325 MG tablet Take 1 tablet by mouth every 6 (six) hours as needed for up to 7 doses for severe pain., Starting Sat 04/21/2018, Print         Note:  This document was prepared using Dragon voice recognition software and may include unintentional dictation errors.      Merrily Brittleifenbark, Makayla Confer, MD 04/23/18 [phone]

## 2018-04-21 NOTE — ED Triage Notes (Signed)
Pt states she believes she twisted her left ankle descending porch steps x2. Pt with cms intact. No obvious deformity noted, pt declines wheelchair.

## 2018-04-21 NOTE — ED Notes (Signed)
No peripheral IV placed this visit.   Discharge instructions reviewed with patient. Questions fielded by this RN. Patient verbalizes understanding of instructions. Patient discharged home in stable condition per Rifenbark. No acute distress noted at time of discharge.   

## 2018-05-22 IMAGING — CT CT RENAL STONE PROTOCOL
2 of 4 series · 17 of 46 positions shown, 19 images · non-contrast
Comparison: 08/29/2016.

CLINICAL DATA: Right flank pain for the past 2-3 days.

EXAM:
CT ABDOMEN AND PELVIS WITHOUT CONTRAST
TECHNIQUE: Multidetector CT imaging of the abdomen and pelvis was performed
following the standard protocol without IV contrast.

[Series 2: stone full standard · axial · 0.75mm/px · z∈[-854,-418]mm · 14 of 95 slices shown, 16 images]
[im 4/95  soft-tissue]
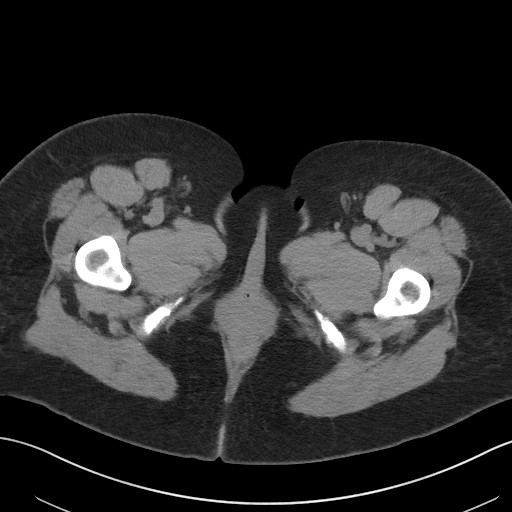
[im 4/95  bone]
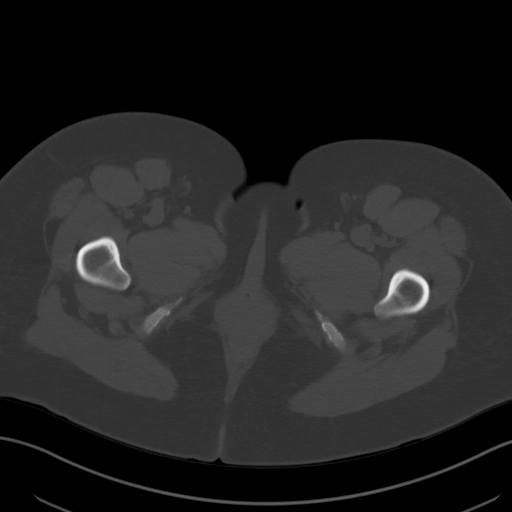
[im 12/95  soft-tissue]
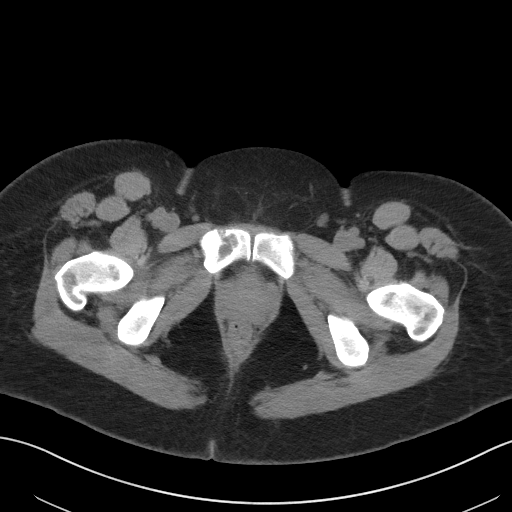
[im 20/95  soft-tissue]
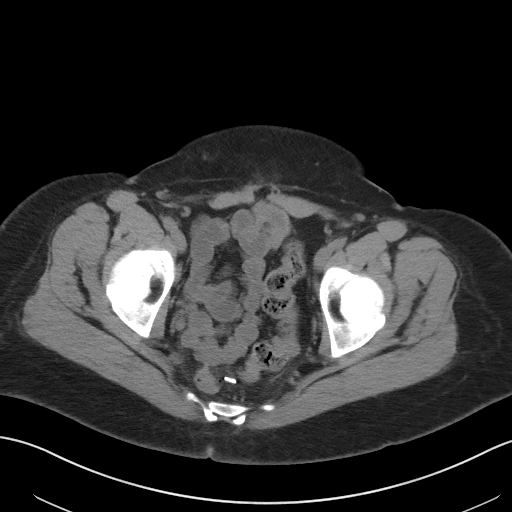
[im 24/95  soft-tissue]
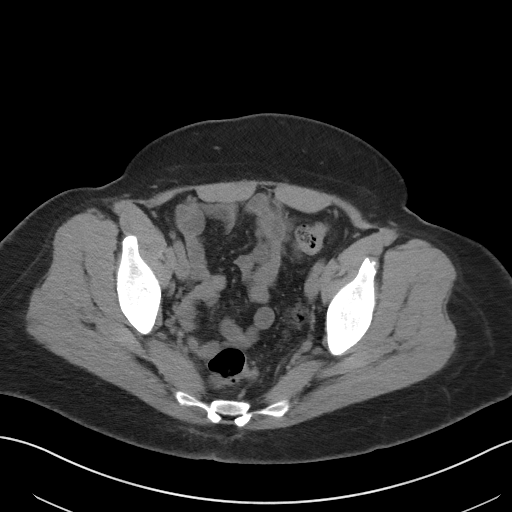
[im 32/95  soft-tissue]
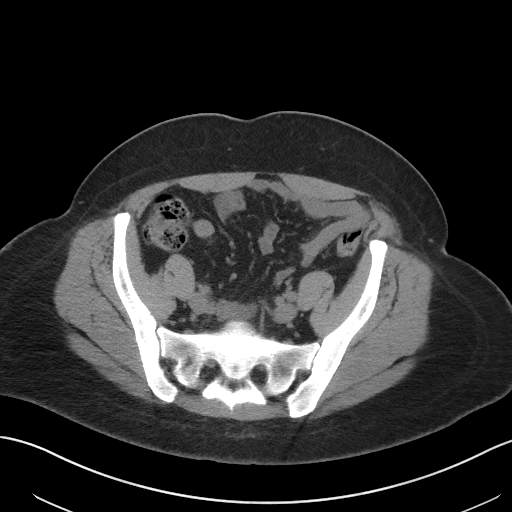
[im 40/95  soft-tissue]
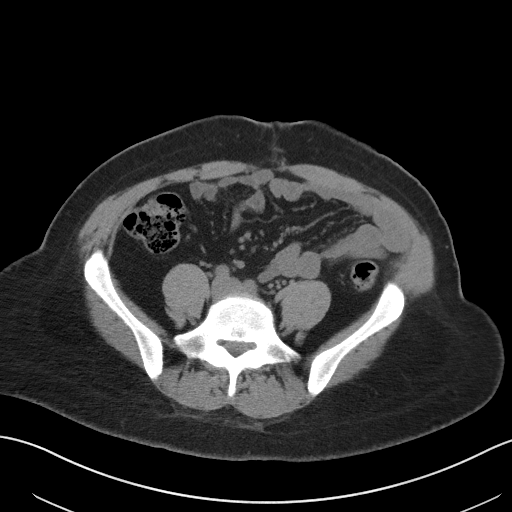
[im 44/95  soft-tissue]
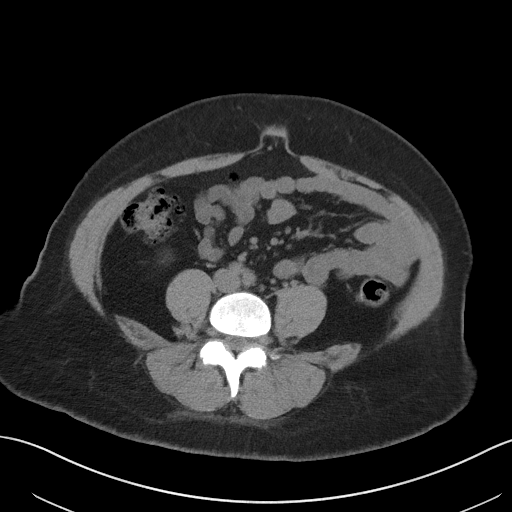
[im 51/95  soft-tissue]
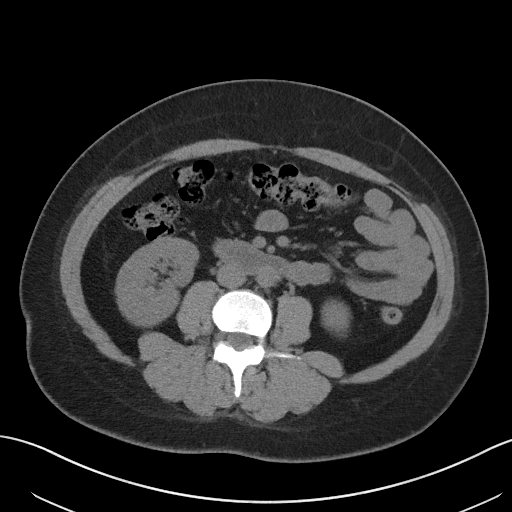
[im 55/95  soft-tissue]
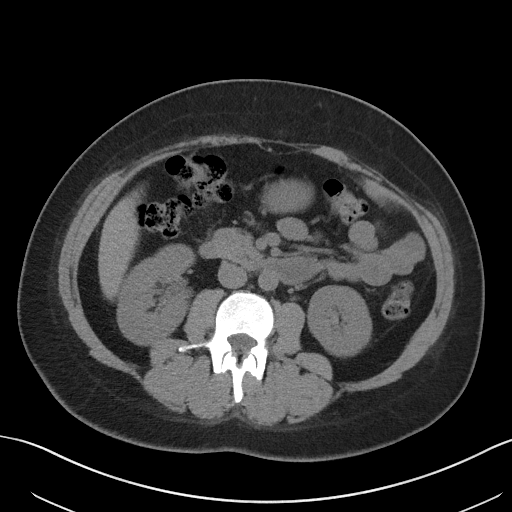
[im 55/95  bone]
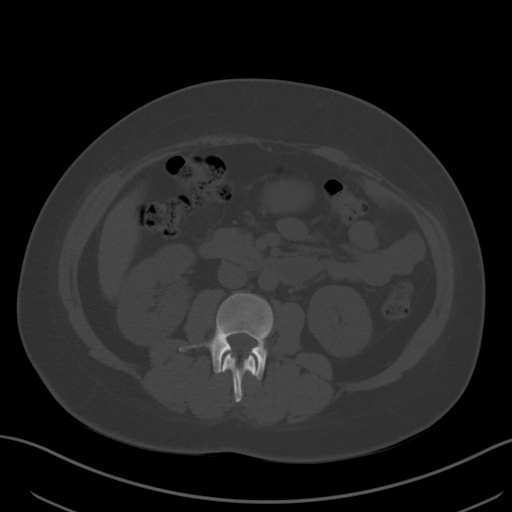
[im 63/95  soft-tissue]
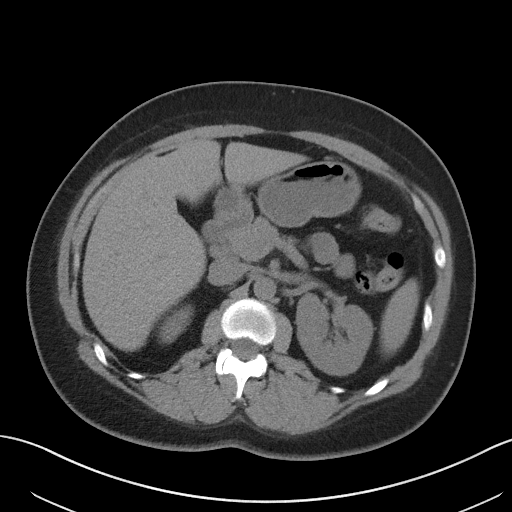
[im 71/95  soft-tissue]
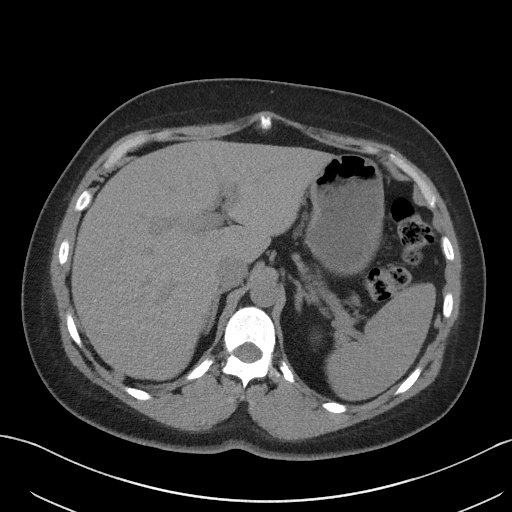
[im 75/95  soft-tissue]
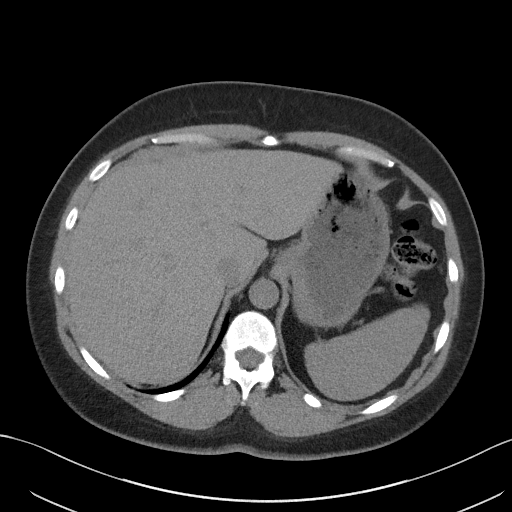
[im 83/95  soft-tissue]
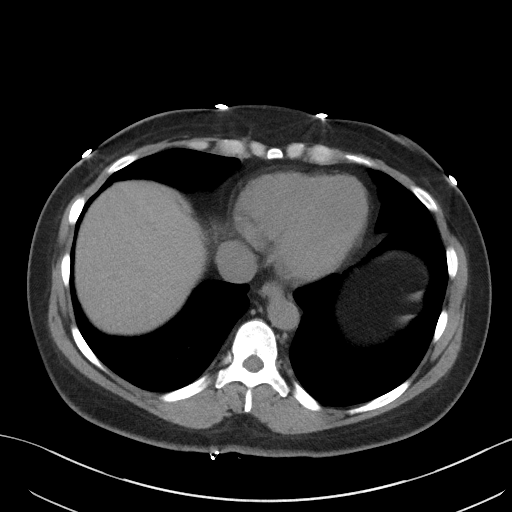
[im 91/95  soft-tissue]
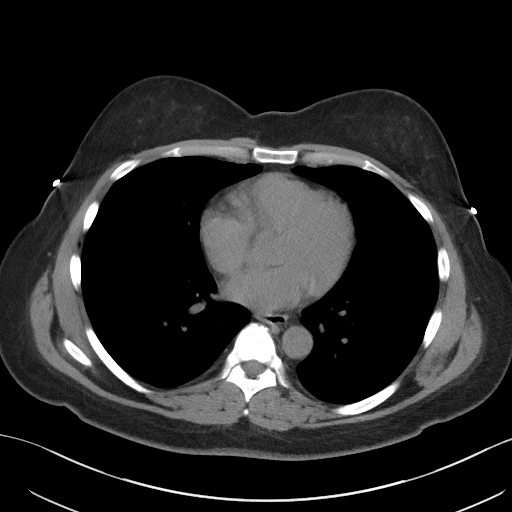

[Series 5: coronal · coronal · 0.79mm/px · 3 of 142 slices shown]
[im 48/142  soft-tissue]
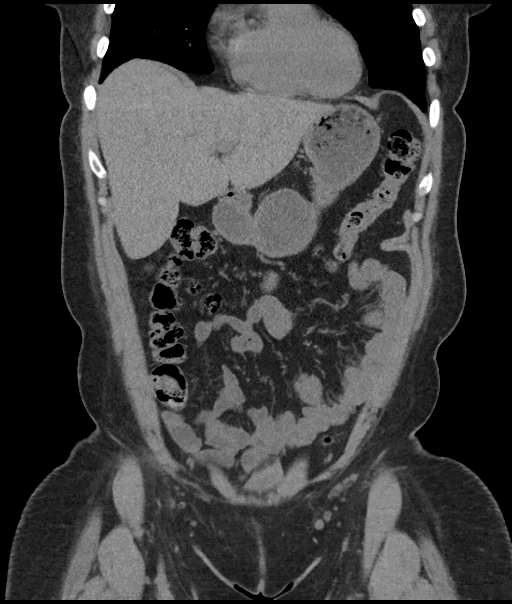
[im 63/142  soft-tissue]
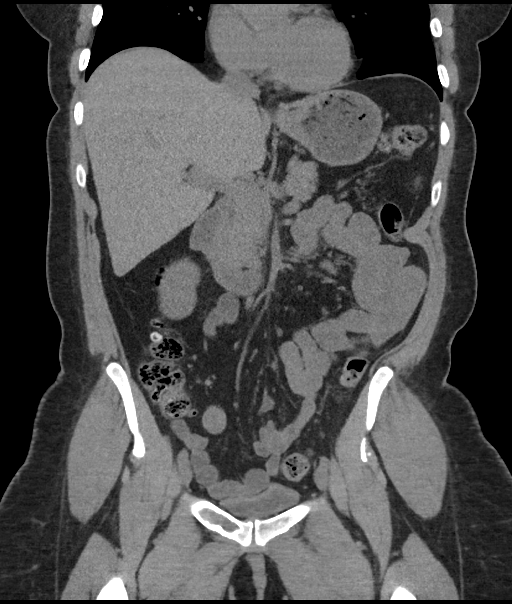
[im 79/142  soft-tissue]
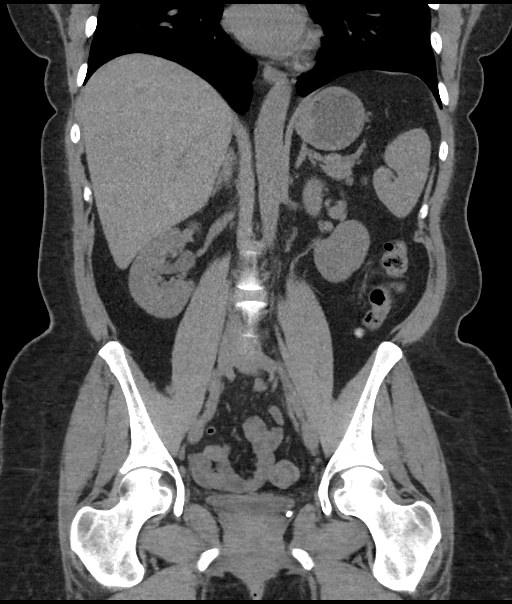

[17 of 46 positions shown; findings below may reference images not displayed]

FINDINGS: Lower chest: Unremarkable.

Hepatobiliary: Cholecystectomy clips.  Normal appearing liver.

Pancreas: Unremarkable. No pancreatic ductal dilatation or
surrounding inflammatory changes.

Spleen: Normal in size without focal abnormality.

Adrenals/Urinary Tract: Adrenal glands are unremarkable. Kidneys are
normal, without renal calculi, focal lesion, or hydronephrosis.
Bladder is unremarkable.

Stomach/Bowel: Multiple colonic diverticula without evidence of
diverticulitis. Normal appearing stomach, small bowel and appendix.

Vascular/Lymphatic: No significant vascular findings are present. No
enlarged abdominal or pelvic lymph nodes.

Reproductive: Status post hysterectomy. No adnexal masses.

Other: Small umbilical hernia containing fat.

Musculoskeletal: Minimal lower thoracic spine degenerative changes.
Stable lucency in the right superior pubic ramus with an appearance
compatible with fibrous dysplasia.
IMPRESSION: 1. No acute abnormality.
2. Sigmoid and descending colon diverticulosis.
3. No urinary tract abnormalities.

## 2018-06-26 LAB — HM HIV SCREENING LAB: HM HIV SCREENING: NEGATIVE

## 2018-10-26 DIAGNOSIS — F419 Anxiety disorder, unspecified: Secondary | ICD-10-CM

## 2019-01-04 ENCOUNTER — Emergency Department: Payer: Medicaid Other

## 2019-01-04 ENCOUNTER — Other Ambulatory Visit: Payer: Self-pay

## 2019-01-04 ENCOUNTER — Emergency Department
Admission: EM | Admit: 2019-01-04 | Discharge: 2019-01-04 | Disposition: A | Discharge status: Home / Self Care | Payer: Medicaid Other | Attending: Emergency Medicine | Admitting: Emergency Medicine

## 2019-01-04 DIAGNOSIS — R05 Cough: Secondary | ICD-10-CM | POA: Insufficient documentation

## 2019-01-04 DIAGNOSIS — Z794 Long term (current) use of insulin: Secondary | ICD-10-CM | POA: Insufficient documentation

## 2019-01-04 DIAGNOSIS — R079 Chest pain, unspecified: Secondary | ICD-10-CM

## 2019-01-04 DIAGNOSIS — N390 Urinary tract infection, site not specified: Secondary | ICD-10-CM | POA: Diagnosis not present

## 2019-01-04 DIAGNOSIS — E119 Type 2 diabetes mellitus without complications: Secondary | ICD-10-CM | POA: Insufficient documentation

## 2019-01-04 DIAGNOSIS — R059 Cough, unspecified: Secondary | ICD-10-CM

## 2019-01-04 DIAGNOSIS — I10 Essential (primary) hypertension: Secondary | ICD-10-CM | POA: Insufficient documentation

## 2019-01-04 DIAGNOSIS — Z79899 Other long term (current) drug therapy: Secondary | ICD-10-CM | POA: Diagnosis not present

## 2019-01-04 LAB — BASIC METABOLIC PANEL
Anion gap: 8 (ref 5–15)
BUN: 20 mg/dL (ref 6–20)
CO2: 28 mmol/L (ref 22–32)
Calcium: 9.3 mg/dL (ref 8.9–10.3)
Chloride: 99 mmol/L (ref 98–111)
Creatinine, Ser: 1 mg/dL (ref 0.44–1.00)
GFR calc Af Amer: >60 mL/min (ref >60)
GFR calc non Af Amer: >60 mL/min (ref >60)
Glucose, Bld: 384 mg/dL — ABNORMAL HIGH (ref 70–99)
Potassium: 4.2 mmol/L (ref 3.5–5.1)
Sodium: 135 mmol/L (ref 135–145)

## 2019-01-04 LAB — GLUCOSE, CAPILLARY
Glucose-Capillary: 302 mg/dL — ABNORMAL HIGH (ref 70–99)
Glucose-Capillary: 246 mg/dL — ABNORMAL HIGH (ref 70–99)
Glucose-Capillary: 249 mg/dL — ABNORMAL HIGH (ref 70–99)

## 2019-01-04 LAB — CBC
HCT: 43.3 % (ref 36.0–46.0)
Hemoglobin: 14.6 g/dL (ref 12.0–15.0)
MCH: 27.8 pg (ref 26.0–34.0)
MCHC: 33.7 g/dL (ref 30.0–36.0)
MCV: 82.3 fL (ref 80.0–100.0)
Platelets: 244 10*3/uL (ref 150–400)
RBC: 5.26 MIL/uL — ABNORMAL HIGH (ref 3.87–5.11)
RDW: 12.3 % (ref 11.5–15.5)
WBC: 6.2 10*3/uL (ref 4.0–10.5)
nRBC: 0 % (ref 0.0–0.2)

## 2019-01-04 LAB — URINALYSIS, COMPLETE (UACMP) WITH MICROSCOPIC
Bilirubin Urine: NEGATIVE
Glucose, UA: >=500 mg/dL — AB
Hgb urine dipstick: NEGATIVE
Ketones, ur: NEGATIVE mg/dL
Nitrite: POSITIVE — AB
Protein, ur: 100 mg/dL — AB
Specific Gravity, Urine: 1.016 (ref 1.005–1.030)
pH: 5 (ref 5.0–8.0)

## 2019-01-04 LAB — TROPONIN I
Troponin I: <0.03 ng/mL (ref <0.03)
Troponin I: <0.03 ng/mL (ref <0.03)

## 2019-01-04 LAB — POCT PREGNANCY, URINE: Preg Test, Ur: NEGATIVE

## 2019-01-04 LAB — FIBRIN DERIVATIVES D-DIMER (ARMC ONLY): Fibrin derivatives D-dimer (ARMC): 172.76 ng/mL (FEU) (ref 0.00–499.00)

## 2019-01-04 MED ORDER — INSULIN ASPART 100 UNIT/ML ~~LOC~~ SOLN
4.0000 [IU] | Freq: Once | SUBCUTANEOUS | Status: AC
  Administered 2019-01-04: 4 [IU] via INTRAVENOUS

## 2019-01-04 MED ORDER — INSULIN ASPART 100 UNIT/ML ~~LOC~~ SOLN
SUBCUTANEOUS | Status: AC
  Administered 2019-01-04: 17:00:00 4 [IU] via INTRAVENOUS
  Filled 2019-01-04: qty 1

## 2019-01-04 MED ORDER — AMOXICILLIN-POT CLAVULANATE 875-125 MG PO TABS
1.0000 | ORAL_TABLET | Freq: Once | ORAL | Status: AC
  Administered 2019-01-04: 1 via ORAL
  Filled 2019-01-04 (×2): qty 1

## 2019-01-04 MED ORDER — ASPIRIN 81 MG PO CHEW
324.0000 mg | CHEWABLE_TABLET | Freq: Once | ORAL | Status: AC
  Administered 2019-01-04: 324 mg via ORAL
  Filled 2019-01-04: qty 4

## 2019-01-04 MED ORDER — AMOXICILLIN-POT CLAVULANATE 500-125 MG PO TABS: 1.0000 | ORAL_TABLET | Freq: Three times a day (TID) | ORAL | 0 refills | Status: DC

## 2019-01-04 NOTE — ED Triage Notes (Signed)
Pt presents ambulatory to triage w/ c/o chest pressure x 3 days. Pt denies cardiac hx. Pt endorses DM II and HTN and hypercholesterolemia. Pt has not spoken to PCP. Pt denies exposure to other people who are ill w/ COVID sxs. Pt endorses cough that is worse at night.

## 2019-01-04 NOTE — ED Triage Notes (Signed)
Reports CP that started last night, heaviness to center of chest. Reports nonproductive cough. Denies SOB or fever. Pt alert and oriented X4, active, cooperative, pt in NAD. RR even and unlabored, color WNL.

## 2019-01-04 NOTE — ED Provider Notes (Signed)
[facility] Emergency Department Provider Note ____________________________________________  First MD Initiated Contact with Patient 01/04/19 1550     (approximate)  I have reviewed the triage vital signs and the nursing notes.  HISTORY  Chief Complaint Chest Pain and Cough  HPI Noah Charonretha Ann Merryfield is a 48 y.o. female is a history of diabetes and hypertension  also depression  Patient reports she is developed a dry cough over the last 3 days.  About a day ago she started noticed that it felt like when she is coughing she has a little bit of a tight feeling in the center of her chest.  There is no radiation of pain.  No nausea vomiting.  She does not feel short of breath there is rather reports a frequent cough.  She has had no fevers or chills.  She is not been around anyone known to have coronavirus  She does have diabetes and hypertension but denies a history of heart disease.  She has a family history of heart disease in her aunt, but not early in onset and her direct family  Reports she is not short of breath.  No leg swelling.  No history of blood clots.  No chest pain except for slight tightness and seems to be more so when she coughs  Past Medical History:  Diagnosis Date  . Diabetes mellitus without complication (HCC)   . Hypertension     Patient Active Problem List   Diagnosis Date Noted  . Severe recurrent major depression with psychotic features (HCC) 07/11/2017  . Diabetes mellitus without complication (HCC) 07/11/2017  . Hypertension 07/11/2017  . Anxiety 01/19/2012    Past Surgical History:  Procedure Laterality Date  . ABDOMINAL HYSTERECTOMY      Prior to Admission medications   Medication Sig Start Date End Date Taking? Authorizing Provider  amoxicillin-clavulanate (AUGMENTIN) 500-125 MG tablet Take 1 tablet (500 mg total) by mouth 3 (three) times daily. 01/04/19   Sharyn CreamerQuale, Amanuel Sinkfield, MD  FLUoxetine (PROZAC) 40 MG capsule Take 1 capsule (40  mg total) by mouth daily after breakfast. 07/18/17   McNew, Ileene HutchinsonHolly R, MD  hydrochlorothiazide (HYDRODIURIL) 25 MG tablet Take 1 tablet (25 mg total) by mouth daily. 07/17/17   McNew, Ileene HutchinsonHolly R, MD  HYDROcodone-acetaminophen (NORCO) 5-325 MG tablet Take 1 tablet by mouth every 6 (six) hours as needed for up to 7 doses for severe pain. 04/21/18   Merrily Brittleifenbark, Neil, MD  ibuprofen (ADVIL,MOTRIN) 600 MG tablet Take 1 tablet (600 mg total) by mouth every 8 (eight) hours as needed. 04/21/18   Merrily Brittleifenbark, Neil, MD  insulin aspart (NOVOLOG) 100 UNIT/ML injection Inject 4 Units into the skin 3 (three) times daily with meals. Use only if eat 50% of meal 07/17/17 08/16/17  McNew, Ileene HutchinsonHolly R, MD  insulin aspart (NOVOLOG) 100 UNIT/ML injection Inject 0-15 Units into the skin 3 (three) times daily with meals. 07/17/17 08/16/17  McNew, Ileene HutchinsonHolly R, MD  insulin aspart (NOVOLOG) 100 UNIT/ML injection Inject 0-5 Units into the skin at bedtime. 07/17/17   McNew, Ileene HutchinsonHolly R, MD  insulin glargine (LANTUS) 100 UNIT/ML injection Inject 0.3 mLs (30 Units total) into the skin at bedtime. 07/17/17 08/16/17  McNew, Ileene HutchinsonHolly R, MD  lisinopril (PRINIVIL,ZESTRIL) 40 MG tablet Take 1 tablet (40 mg total) by mouth daily. 07/17/17 08/16/17  McNew, Ileene HutchinsonHolly R, MD  ondansetron (ZOFRAN) 4 MG tablet Take 1 tablet (4 mg total) by mouth every 8 (eight) hours as needed for nausea or vomiting. 08/10/17  Phineas Semen, MD  risperiDONE (RISPERDAL) 0.5 MG tablet Take 1 tablet (0.5 mg total) by mouth at bedtime. 07/17/17   McNew, Ileene Hutchinson, MD  traZODone (DESYREL) 50 MG tablet Take 1 tablet (50 mg total) by mouth at bedtime as needed for sleep. 07/17/17   McNew, Ileene Hutchinson, MD    Allergies Patient has no known allergies.  No family history on file.  Social History Social History   Tobacco Use  . Smoking status: Never Smoker  . Smokeless tobacco: Never Used  Substance Use Topics  . Alcohol use: No  . Drug use: No    Review of Systems Constitutional: No  fever/chills Eyes: No visual changes. ENT: No sore throat. Cardiovascular: Denies chest pain. Respiratory: Denies shortness of breath.  Dry cough Gastrointestinal: No abdominal pain.   Genitourinary: Some cloudy urine, little bit of discomfort with urination.  Denies pregnancy Musculoskeletal: Negative for back pain. Skin: Negative for rash. Neurological: Negative for headaches, areas of focal weakness or numbness.    ____________________________________________   PHYSICAL EXAM:  VITAL SIGNS: ED Triage Vitals [01/04/19 1534]  Enc Vitals Group     BP 114/84     Pulse Rate (!) 101     Resp 18     Temp 98.4 F (36.9 C)     Temp Source Oral     SpO2 100 %     Weight 89 lb (40.4 kg)     Height  (1.651 m)     Head Circumference      Peak Flow      Pain Score 5     Pain Loc      Pain Edu?      Excl. in GC?     Constitutional: Alert and oriented. Well appearing and in no acute distress. Eyes: Conjunctivae are normal. Head: Atraumatic. Nose: No congestion/rhinnorhea. Mouth/Throat: Mucous membranes are moist. Neck: No stridor.  Cardiovascular: Normal rate, regular rhythm. Grossly normal heart sounds.  Good peripheral circulation. Respiratory: Normal respiratory effort.  No retractions. Lungs CTAB.  She exhibits an occasional dry cough. Gastrointestinal: Soft and nontender. No distention. Musculoskeletal: No lower extremity tenderness nor edema. Neurologic:  Normal speech and language. No gross focal neurologic deficits are appreciated.  Skin:  Skin is warm, dry and intact. No rash noted. Psychiatric: Mood and affect are normal. Speech and behavior are normal.  ____________________________________________   LABS (all labs ordered are listed, but only abnormal results are displayed)  Labs Reviewed  BASIC METABOLIC PANEL - Abnormal; Notable for the following components:      Result Value   Glucose, Bld 384 (*)    All other components within normal limits  CBC -  Abnormal; Notable for the following components:   RBC 5.26 (*)    All other components within normal limits  GLUCOSE, CAPILLARY - Abnormal; Notable for the following components:   Glucose-Capillary 302 (*)    All other components within normal limits  GLUCOSE, CAPILLARY - Abnormal; Notable for the following components:   Glucose-Capillary 246 (*)    All other components within normal limits  URINALYSIS, COMPLETE (UACMP) WITH MICROSCOPIC - Abnormal; Notable for the following components:   Color, Urine YELLOW (*)    APPearance CLOUDY (*)    Glucose, UA >=500 (*)    Protein, ur 100 (*)    Nitrite POSITIVE (*)    Leukocytes,Ua MODERATE (*)    Bacteria, UA RARE (*)    All other components within normal limits  TROPONIN I  FIBRIN DERIVATIVES D-DIMER ([facility] ONLY)  TROPONIN I  CBG MONITORING, ED  CBG MONITORING, ED  CBG MONITORING, ED  CBG MONITORING, ED   ____________________________________________  EKG  Reviewed and entered by me at 1540 Heart rate 110 QRS 80 QTc 450 Sinus tachycardia without evidence acute ischemia ____________________________________________  RADIOLOGY  Dg Chest Portable 1 View  Result Date: 01/04/2019 CLINICAL DATA:  Chest pain beginning last night. EXAM: PORTABLE CHEST 1 VIEW COMPARISON:  11/08/2016 FINDINGS: The heart size and mediastinal contours are within normal limits. Both lungs are clear. The visualized skeletal structures are unremarkable. IMPRESSION: No active disease. Electronically Signed   By: Paulina Fusi M.D.   On: 01/04/2019 16:34    Chest x-ray negative for acute ____________________________________________   PROCEDURES  Procedure(s) performed: None  Procedures  Critical Care performed: No  ____________________________________________   INITIAL IMPRESSION / ASSESSMENT AND PLAN / ED COURSE  Pertinent labs & imaging results that were available during my care of the patient were reviewed by me and considered in my medical  decision making (see chart for details).   Patient reports 3 days of a dry nonproductive cough followed by a slight discomfort in her anterior chest.  Is very atypical of an acute coronary syndrome type of pain.  Reports it is does not even feel like it hurts, she tells me it just a little bit of a tightness when she coughs.  She is alert well-appearing.  Slightly tachycardic on arrival.  Technically cannot exclude pulmonary embolism using the [facility] rule because she had some tachycardia, but she is low risk by Wells criteria and will send d-dimer.  I do not suspect PE, she does not have any sudden severe or pleuritic pain.  She is been having chest discomfort off and on with coughing for at least the last day, her EKG is reassuring.  Will obtain chest x-ray labs and further evaluation, seems unlikely to represent coronavirus as her only symptom right now seems to be a dry cough and some slight chest discomfort, afebrile, denies fevers chills myalgias or GI symptoms.  Ramisa Duman was evaluated in Emergency Department on 01/04/2019 for the symptoms described in the history of present illness. She was evaluated in the context of the global COVID-19 pandemic, which necessitated consideration that the patient might be at risk for infection with the SARS-CoV-2 virus that causes COVID-19. Institutional protocols and algorithms that pertain to the evaluation of patients at risk for COVID-19 are in a state of rapid change based on information released by regulatory bodies including the CDC and federal and state organizations. These policies and algorithms were followed during the patient's care in the ED.   Clinical Course as of Jan 04 2136  Fri Jan 04, 2019  1722 Low risk by Anner Crete criteria, no signs or clinical symptoms suggest PE.  D-dimer is less than 500   [MQ]    Clinical Course User Index [MQ] Sharyn Creamer, MD   Second troponin also normal.  Low risk by heart score, atypical symptoms doubt coronary  disease as cause.  Consistent with cough, likely secondary to slight viral upper respiratory infection without evidence of pneumonia.  Additionally does have evidence for urinary tract infection, she reports she is used Amoxil in the past with good success.  Will treat with amoxicillin.  Return precautions and treatment recommendations and follow-up discussed with the patient who is agreeable with the plan.  Patient is ambulatory, no distress comfortable with plan for discharge antibiotics, and treatment and  return precautions.  ____________________________________________   FINAL CLINICAL IMPRESSION(S) / ED DIAGNOSES  Final diagnoses:  Chest pain with low risk for cardiac etiology  Cough  Lower urinary tract infection, acute        Note:  This document was prepared using Dragon voice recognition software and may include unintentional dictation errors       Sharyn Creamer, MD 01/04/19 2137

## 2019-01-08 ENCOUNTER — Telehealth: Payer: Self-pay | Admitting: Cardiovascular Disease

## 2019-01-08 NOTE — Telephone Encounter (Signed)
Virtual Visit Pre-Appointment Phone Call  "(Name), I am calling you today to discuss your upcoming appointment. We are currently trying to limit exposure to the virus that causes COVID-19 by seeing patients at home rather than in the office."  1. "What is the BEST phone number to call the day of the visit?" - include this in appointment notes  2. Do you have or have access to (through a family member/friend) a smartphone with video capability that we can use for your visit?" a. If yes - list this number in appt notes as cell (if different from BEST phone #) and list the appointment type as a VIDEO visit in appointment notes b. If no - list the appointment type as a PHONE visit in appointment notes  3. Confirm consent - "In the setting of the current Covid19 crisis, you are scheduled for a (phone or video) visit with your provider on (date) at (time).  Just as we do with many in-office visits, in order for you to participate in this visit, we must obtain consent.  If you'd like, I can send this to your mychart (if signed up) or email for you to review.  Otherwise, I can obtain your verbal consent now.  All virtual visits are billed to your insurance company just like a normal visit would be.  By agreeing to a virtual visit, we'd like you to understand that the technology does not allow for your provider to perform an examination, and thus may limit your provider's ability to fully assess your condition. If your provider identifies any concerns that need to be evaluated in person, we will make arrangements to do so.  Finally, though the technology is pretty good, we cannot assure that it will always work on either your or our end, and in the setting of a video visit, we may have to convert it to a phone-only visit.  In either situation, we cannot ensure that we have a secure connection.  Are you willing to proceed?" STAFF: Did the patient verbally acknowledge consent to telehealth visit? Document  YES/NO here: yes  4. Advise patient to be prepared - "Two hours prior to your appointment, go ahead and check your blood pressure, pulse, oxygen saturation, and your weight (if you have the equipment to check those) and write them all down. When your visit starts, your provider will ask you for this information. If you have an Apple Watch or Kardia device, please plan to have heart rate information ready on the day of your appointment. Please have a pen and paper handy nearby the day of the visit as well."  5. Give patient instructions for MyChart download to smartphone OR Doximity/Doxy.me as below if video visit (depending on what platform provider is using)  6. Inform patient they will receive a phone call 15 minutes prior to their appointment time (may be from unknown caller ID) so they should be prepared to answer    TELEPHONE CALL NOTE  Kelly [name] has been deemed a candidate for a follow-up tele-health visit to limit community exposure during the Covid-19 pandemic. I spoke with the patient via phone to ensure availability of phone/video source, confirm preferred email & phone number, and discuss instructions and expectations.  I reminded Kelly [name] to be prepared with any vital sign and/or heart rhythm information that could potentially be obtained via home monitoring, at the time of her visit. I reminded Kelly [name] to expect a phone call prior to  her visit.  Joline MaxcyJennifer B Harkins 01/08/2019 3:12 PM   INSTRUCTIONS FOR DOWNLOADING THE MYCHART APP TO SMARTPHONE  - The patient must first make sure to have activated MyChart and know their login information - If Apple, go to [employer] and type in MyChart in the search bar and download the app. If Android, ask patient to go to [employer] Play Store and type in LaieMyChart in the search bar and download the app. The app is free but as with any other app downloads, their phone may require them to verify saved payment information or  Apple/Android password.  - The patient will need to then log into the app with their MyChart username and password, and select [facility] as their healthcare provider to link the account. When it is time for your visit, go to the MyChart app, find appointments, and click Begin Video Visit. Be sure to Select Allow for your device to access the Microphone and Camera for your visit. You will then be connected, and your provider will be with you shortly.  **If they have any issues connecting, or need assistance please contact MyChart service desk [phone])**  **If using a computer, in order to ensure the best quality for their visit they will need to use either of the following Internet Browsers: [employer], or Google Chrome**  IF USING DOXIMITY or DOXY.ME - The patient will receive a link just prior to their visit by text.     FULL LENGTH CONSENT FOR TELE-HEALTH VISIT   I hereby voluntarily request, consent and authorize [facility] and its employed or contracted physicians, physician assistants, nurse practitioners or other licensed health care professionals (the Practitioner), to provide me with telemedicine health care services (the Services") as deemed necessary by the treating Practitioner. I acknowledge and consent to receive the Services by the Practitioner via telemedicine. I understand that the telemedicine visit will involve communicating with the Practitioner through live audiovisual communication technology and the disclosure of certain medical information by electronic transmission. I acknowledge that I have been given the opportunity to request an in-person assessment or other available alternative prior to the telemedicine visit and am voluntarily participating in the telemedicine visit.  I understand that I have the right to withhold or withdraw my consent to the use of telemedicine in the course of my care at any time, without affecting my right to future care  or treatment, and that the Practitioner or I may terminate the telemedicine visit at any time. I understand that I have the right to inspect all information obtained and/or recorded in the course of the telemedicine visit and may receive copies of available information for a reasonable fee.  I understand that some of the potential risks of receiving the Services via telemedicine include:   Delay or interruption in medical evaluation due to technological equipment failure or disruption;  Information transmitted may not be sufficient (e.g. poor resolution of images) to allow for appropriate medical decision making by the Practitioner; and/or   In rare instances, security protocols could fail, causing a breach of personal health information.  Furthermore, I acknowledge that it is my responsibility to provide information about my medical history, conditions and care that is complete and accurate to the best of my ability. I acknowledge that Practitioner's advice, recommendations, and/or decision may be based on factors not within their control, such as incomplete or inaccurate data provided by me or distortions of diagnostic images or specimens that may result from electronic transmissions. I  understand that the practice of medicine is not an exact science and that Practitioner makes no warranties or guarantees regarding treatment outcomes. I acknowledge that I will receive a copy of this consent concurrently upon execution via email to the email address I last provided but may also request a printed copy by calling the office of [facility].    I understand that my insurance will be billed for this visit.   I have read or had this consent read to me.  I understand the contents of this consent, which adequately explains the benefits and risks of the Services being provided via telemedicine.   I have been provided ample opportunity to ask questions regarding this consent and the Services and have had  my questions answered to my satisfaction.  I give my informed consent for the services to be provided through the use of telemedicine in my medical care  By participating in this telemedicine visit I agree to the above.

## 2019-01-09 ENCOUNTER — Other Ambulatory Visit: Payer: Self-pay

## 2019-01-09 ENCOUNTER — Encounter: Payer: Self-pay | Admitting: Cardiovascular Disease

## 2019-01-09 ENCOUNTER — Telehealth (INDEPENDENT_AMBULATORY_CARE_PROVIDER_SITE_OTHER): Payer: Medicaid Other | Admitting: Cardiovascular Disease

## 2019-01-09 VITALS — HR 84 | Ht 64.0 in | Wt 180.0 lb

## 2019-01-09 DIAGNOSIS — R0789 Other chest pain: Secondary | ICD-10-CM | POA: Diagnosis not present

## 2019-01-09 NOTE — Progress Notes (Signed)
Virtual Visit via Video Note   This visit type was conducted due to national recommendations for restrictions regarding the COVID-19 Pandemic (e.g. social distancing) in an effort to limit this patient's exposure and mitigate transmission in our community.  Due to her co-morbid illnesses, this patient is at least at moderate risk for complications without adequate follow up.  This format is felt to be most appropriate for this patient at this time.  All issues noted in this document were discussed and addressed.  A limited physical exam was performed with this format.  Please refer to the patient's chart for her consent [facility].    Evaluation Performed: New evaluation for chest pain.  This started as a video visit but the patient could not get her phone to connect and thus I had to switch to a phone visit.  Date:  01/09/2019   ID:  Kelly CharonAretha Ann [name], DOB 08-22-1971, MRN [id number]  Patient Location: Home Provider Location: Office  [facility]  Cardiologist:  No primary care provider on file.  Electrophysiologist:  None   Chief Complaint: Chest pain  History of Present Illness:    Kelly [name] is a 48 y.o. female who was referred by Dr. Fanny BienQuale for evaluation of chest pain.  She has no prior cardiac history.  She has chronic medical conditions that include diabetes, hypertension and depression. She has recent cough associated with tightness feeling in the center of the chest.  Evaluation in the ED showed normal troponin and d-dimer.  She was suspected of having viral upper respiratory tract infection and there was also evidence of urinary tract infection.  She was prescribed amoxicillin. She reports now that her main symptoms were actually cough with minimal chest discomfort.  She reports no chest pain if she was not having coughing.  She has no exertional symptoms and overall she feels better.  No leg edema or palpitations.   The patient does not have symptoms concerning for COVID-19 infection (fever, chills, cough, or new shortness of breath).    Past Medical History:  Diagnosis Date  . Diabetes mellitus without complication (HCC)   . Hypertension    Past Surgical History:  Procedure Laterality Date  . ABDOMINAL HYSTERECTOMY       Current Meds  Medication Sig  . hydrochlorothiazide (HYDRODIURIL) 25 MG tablet Take 1 tablet (25 mg total) by mouth daily.  . insulin aspart (NOVOLOG) 100 UNIT/ML injection Inject 4 Units into the skin 3 (three) times daily with meals. Use only if eat 50% of meal  . insulin glargine (LANTUS) 100 UNIT/ML injection Inject 0.3 mLs (30 Units total) into the skin at bedtime.  [name]. lisinopril (PRINIVIL,ZESTRIL) 40 MG tablet Take 1 tablet (40 mg total) by mouth daily.     Allergies:   Patient has no known allergies.   Social History   Tobacco Use  . Smoking status: Never Smoker  . Smokeless tobacco: Never Used  Substance Use Topics  . Alcohol use: No  . Drug use: No     Family Hx: The patient's family history is not on file.  ROS:   Please see the history of present illness.     All other systems reviewed and are negative.   Prior CV studies:   The following studies were reviewed today:    Labs/Other Tests and Data Reviewed:    EKG:  An ECG dated 01/04/2019 was personally reviewed today and demonstrated:  Sinus tachycardia with  no significant ST or T wave changes  Recent Labs: 01/04/2019: BUN 20; Creatinine, Ser 1.00; Hemoglobin 14.6; Platelets 244; Potassium 4.2; Sodium 135   Recent Lipid Panel Lab Results  Component Value Date/Time   CHOL 143 07/12/2017 07:20 AM   TRIG 121 07/12/2017 07:20 AM   HDL 35 (L) 07/12/2017 07:20 AM   CHOLHDL 4.1 07/12/2017 07:20 AM   LDLCALC 84 07/12/2017 07:20 AM    Wt Readings from Last 3 Encounters:  01/09/19 180 lb (81.6 kg)  01/04/19 89 lb (40.4 kg)  04/21/18 187 lb (84.8 kg)     Objective:    Vital Signs:  Pulse 84    Ht 5\' 4"  (1.626 m)   Wt 180 lb (81.6 kg)   BMI 30.90 kg/m      ASSESSMENT & PLAN:    1. Atypical chest pain with risk factors for coronary artery disease including diabetes and hypertension.  The patient reports that her chest pain was in the setting of coughing only and she has not had any recurrent symptoms.  She denies any exertional symptoms.  Given improvement, I will hold off on further ischemic cardiac evaluation but I did explain to her that if she develops recurrent chest pain especially not related to coughing, she will require stress testing.  I discussed the importance of controlling risk factors. 2. Essential hypertension: Currently on lisinopril and hydrochlorothiazide.  The patient could not get her blood pressure measured today.  COVID-19 Education: The signs and symptoms of COVID-19 were discussed with the patient and how to seek care for testing (follow up with PCP or arrange E-visit).  The importance of social distancing was discussed today.  Time:   Today, I have spent 30 minutes with the patient with telehealth technology discussing the above problems.     Medication Adjustments/Labs and Tests Ordered: Current medicines are reviewed at length with the patient today.  Concerns regarding medicines are outlined above.   Tests Ordered: No orders of the defined types were placed in this encounter.   Medication Changes: No orders of the defined types were placed in this encounter.   Disposition:  Follow up prn  Signed, Lorine Bears, MD  01/09/2019 2:35 PM    [facility]

## 2019-01-09 NOTE — Patient Instructions (Signed)
Medication Instructions:  No change If you need a refill on your cardiac medications before your next appointment, please call your pharmacy.   Lab work: None If you have labs (blood work) drawn today and your tests are completely normal, you will receive your results only by: [name] MyChart Message (if you have MyChart) OR . A paper copy in the mail If you have any lab test that is abnormal or we need to change your treatment, we will call you to review the results.  Testing/Procedures: None  Follow-Up: Follow-up with [place] as needed if you develop recurrent chest pain.

## 2019-02-06 ENCOUNTER — Other Ambulatory Visit: Payer: Self-pay

## 2019-02-06 ENCOUNTER — Encounter (INDEPENDENT_AMBULATORY_CARE_PROVIDER_SITE_OTHER): Payer: Medicaid Other | Admitting: Psychiatry

## 2019-02-27 ENCOUNTER — Ambulatory Visit (HOSPITAL_COMMUNITY): Payer: Medicaid Other | Admitting: Licensed Clinical Social Worker

## 2019-02-27 DIAGNOSIS — F331 Major depressive disorder, recurrent, moderate: Secondary | ICD-10-CM

## 2019-02-27 DIAGNOSIS — F411 Generalized anxiety disorder: Secondary | ICD-10-CM
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
## 2019-03-18 ENCOUNTER — Ambulatory Visit: Payer: Medicaid Other | Admitting: Psychiatry

## 2019-03-25 ENCOUNTER — Other Ambulatory Visit: Payer: Self-pay

## 2019-03-25 ENCOUNTER — Ambulatory Visit: Payer: Medicaid Other | Admitting: Licensed Clinical Social Worker

## 2019-04-15 ENCOUNTER — Ambulatory Visit: Payer: Medicaid Other | Admitting: Psychiatry

## 2019-05-20 ENCOUNTER — Emergency Department
Admission: EM | Admit: 2019-05-20 | Discharge: 2019-05-20 | Disposition: A | Discharge status: Home / Self Care | Payer: Medicaid Other | Attending: Emergency Medicine | Admitting: Emergency Medicine

## 2019-05-20 ENCOUNTER — Other Ambulatory Visit: Payer: Self-pay

## 2019-05-20 DIAGNOSIS — I10 Essential (primary) hypertension: Secondary | ICD-10-CM | POA: Insufficient documentation

## 2019-05-20 DIAGNOSIS — L729 Follicular cyst of the skin and subcutaneous tissue, unspecified: Secondary | ICD-10-CM | POA: Diagnosis not present

## 2019-05-20 DIAGNOSIS — Z79899 Other long term (current) drug therapy: Secondary | ICD-10-CM | POA: Diagnosis not present

## 2019-05-20 DIAGNOSIS — L02416 Cutaneous abscess of left lower limb: Secondary | ICD-10-CM | POA: Diagnosis present

## 2019-05-20 DIAGNOSIS — Z794 Long term (current) use of insulin: Secondary | ICD-10-CM | POA: Insufficient documentation

## 2019-05-20 DIAGNOSIS — E119 Type 2 diabetes mellitus without complications: Secondary | ICD-10-CM | POA: Insufficient documentation

## 2019-05-20 NOTE — Discharge Instructions (Signed)
Follow-up with your primary care provider or the surgeon listed on your discharge papers.  At this time there is nothing to do with the cyst other than watch it and wait.  Should it become infected such as heat, increase in size or severe pain then return to the emergency department at which time it will drained.  You may take Tylenol or ibuprofen as needed for pain.

## 2019-05-20 NOTE — ED Provider Notes (Signed)
[facility] Emergency Department Provider Note  ____________________________________________   First MD Initiated Contact with Patient 05/20/19 1326     (approximate)  I have reviewed the triage vital signs and the nursing notes.   HISTORY  Chief Complaint Abscess   HPI Kelly [name] is a 48 y.o. female presents to the ED with possible abscess to her left.  Patient states that the area has been there for approximately 2 weeks.  She denies any fever, chills, nausea or vomiting.  She states that is only painful when she is sitting.     Past Medical History:  Diagnosis Date  . Diabetes mellitus without complication ([place])   . Hypertension     Patient Active Problem List   Diagnosis Date Noted  . 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 Diabetes mellitus without complication ([place]) 55/73/2202  . Hypertension 07/11/2017  . Anxiety 01/19/2012    Past Surgical History:  Procedure Laterality Date  . ABDOMINAL HYSTERECTOMY      Prior to Admission medications   Medication Sig Start Date End Date Taking? Authorizing Provider  hydrochlorothiazide (HYDRODIURIL) 25 MG tablet Take 1 tablet (25 mg total) by mouth daily. 07/17/17   McNew, Tyson Babinski, MD  insulin aspart (NOVOLOG) 100 UNIT/ML injection Inject 4 Units into the skin 3 (three) times daily with meals. Use only if eat 50% of meal 07/17/17 01/09/19  McNew, Tyson Babinski, MD  insulin glargine (LANTUS) 100 UNIT/ML injection Inject 0.3 mLs (30 Units total) into the skin at bedtime. 07/17/17 01/09/19  McNew, Tyson Babinski, MD  lisinopril (PRINIVIL,ZESTRIL) 40 MG tablet Take 1 tablet (40 mg total) by mouth daily. 07/17/17 01/09/19  McNew, Tyson Babinski, MD    Allergies Patient has no known allergies.  No family history on file.  Social History Social History   Tobacco Use  . Smoking status: Never Smoker  . Smokeless tobacco: Never Used  Substance Use Topics  . Alcohol use: No  .  Drug use: No    Review of Systems Constitutional: No fever/chills Cardiovascular: Denies chest pain. Respiratory: Denies shortness of breath. Musculoskeletal: Negative for muscle skeletal pain. Skin: Positive for cyst. Neurological: Negative for headaches, focal weakness or numbness. ____________________________________________   PHYSICAL EXAM:  VITAL SIGNS: ED Triage Vitals [05/20/19 1240]  Enc Vitals Group     BP 127/86     Pulse Rate (!) 106     Resp 18     Temp 98.5 F (36.9 C)     Temp Source Oral     SpO2 100 %     Weight 185 lb (83.9 kg)     Height 5\' 6"  (1.676 m)     Head Circumference      Peak Flow      Pain Score 7     Pain Loc      Pain Edu?      Excl. in [place]?    Constitutional: Alert and oriented. Well appearing and in no acute distress. Eyes: Conjunctivae are normal.  Head: Atraumatic. Neck: No stridor.  Cardiovascular: Normal rate, regular rhythm. Grossly normal heart sounds.  Good peripheral circulation. Respiratory: Normal respiratory effort.  No retractions. Lungs CTAB. Musculoskeletal: Moves upper and lower extremities without any difficulty. Neurologic:  Normal speech and language. No gross focal neurologic deficits are appreciated. No gait instability. Skin:  Skin is warm, dry and intact.  1 cm cystic lesion noted superficially underneath the skin.  There is no erythema, warmth and  area is mobile. Psychiatric: Mood and affect are normal. Speech and behavior are normal.  ____________________________________________   LABS (all labs ordered are listed, but only abnormal results are displayed)  Labs Reviewed - No data to display  PROCEDURES  Procedure(s) performed (including Critical Care):  Procedures   ____________________________________________   INITIAL IMPRESSION / ASSESSMENT AND PLAN / ED COURSE  As part of my medical decision making, I reviewed the following data within the electronic MEDICAL RECORD NUMBER Notes from prior ED visits  and [place] Controlled Substance Database  48 year old female presents to the ED with a possible abscess to her left posterior thigh.  On examination there is a 1 cm non-erythematous cystic lesion that is mobile.  No evidence of infection or abscess is noted.  Patient was made aware that this most likely is a dermal cyst.  She will continue to monitor this and she was given the name of a surgeon should she wish to have this completely removed.  ____________________________________________   FINAL CLINICAL IMPRESSION(S) / ED DIAGNOSES  Final diagnoses:  Cyst of skin     ED Discharge Orders    None       Note:  This document was prepared using Dragon voice recognition software and may include unintentional dictation errors.    Tommi RumpsSummers, Carlos Heber L, PA-C 05/20/19 1459    Shaune PollackIsaacs, Cameron, MD 05/20/19 [phone]

## 2019-05-20 NOTE — ED Notes (Signed)
See triage note  Presents with possible abscess area to buttocks  States noticed area couple of days ago

## 2019-05-20 NOTE — ED Triage Notes (Signed)
C/o abscess to left buttock X 2 weeks. Pt alert and oriented X4, cooperative, RR even and unlabored, color WNL. Pt in NAD.

## 2019-05-27 ENCOUNTER — Ambulatory Visit: Payer: Self-pay | Admitting: Nurse Practitioner

## 2019-05-28 ENCOUNTER — Other Ambulatory Visit: Payer: Self-pay

## 2019-05-28 ENCOUNTER — Ambulatory Visit (INDEPENDENT_AMBULATORY_CARE_PROVIDER_SITE_OTHER): Payer: Medicaid Other | Admitting: Psychiatry

## 2019-05-28 DIAGNOSIS — F411 Generalized anxiety disorder: Secondary | ICD-10-CM

## 2019-05-28 NOTE — Progress Notes (Signed)
No response to call 

## 2019-06-14 ENCOUNTER — Observation Stay
Admission: EM | Admit: 2019-06-14 | Discharge: 2019-06-15 | Disposition: A | Discharge status: Home / Self Care | Payer: Medicaid Other | Attending: Internal Medicine | Admitting: Internal Medicine

## 2019-06-14 ENCOUNTER — Observation Stay: Payer: Medicaid Other

## 2019-06-14 ENCOUNTER — Other Ambulatory Visit: Payer: Self-pay

## 2019-06-14 ENCOUNTER — Emergency Department: Payer: Medicaid Other

## 2019-06-14 ENCOUNTER — Encounter: Payer: Self-pay | Admitting: Specialist

## 2019-06-14 DIAGNOSIS — Z79899 Other long term (current) drug therapy: Secondary | ICD-10-CM | POA: Insufficient documentation

## 2019-06-14 DIAGNOSIS — E119 Type 2 diabetes mellitus without complications: Secondary | ICD-10-CM | POA: Diagnosis not present

## 2019-06-14 DIAGNOSIS — Z7902 Long term (current) use of antithrombotics/antiplatelets: Secondary | ICD-10-CM | POA: Diagnosis not present

## 2019-06-14 DIAGNOSIS — F329 Major depressive disorder, single episode, unspecified: Secondary | ICD-10-CM | POA: Diagnosis not present

## 2019-06-14 DIAGNOSIS — I10 Essential (primary) hypertension: Secondary | ICD-10-CM | POA: Insufficient documentation

## 2019-06-14 DIAGNOSIS — I639 Cerebral infarction, unspecified: Secondary | ICD-10-CM | POA: Diagnosis present

## 2019-06-14 DIAGNOSIS — G459 Transient cerebral ischemic attack, unspecified: Secondary | ICD-10-CM | POA: Diagnosis present

## 2019-06-14 DIAGNOSIS — F331 Major depressive disorder, recurrent, moderate: Secondary | ICD-10-CM | POA: Diagnosis present

## 2019-06-14 DIAGNOSIS — Z794 Long term (current) use of insulin: Secondary | ICD-10-CM | POA: Diagnosis not present

## 2019-06-14 DIAGNOSIS — Z23 Encounter for immunization: Secondary | ICD-10-CM | POA: Diagnosis not present

## 2019-06-14 DIAGNOSIS — Z20828 Contact with and (suspected) exposure to other viral communicable diseases: Secondary | ICD-10-CM | POA: Diagnosis not present

## 2019-06-14 DIAGNOSIS — Z7982 Long term (current) use of aspirin: Secondary | ICD-10-CM | POA: Diagnosis not present

## 2019-06-14 LAB — COMPREHENSIVE METABOLIC PANEL
ALT: 36 U/L (ref 0–44)
AST: 25 U/L (ref 15–41)
Albumin: 3.9 g/dL (ref 3.5–5.0)
Alkaline Phosphatase: 73 U/L (ref 38–126)
Anion gap: 13 (ref 5–15)
BUN: 20 mg/dL (ref 6–20)
CO2: 26 mmol/L (ref 22–32)
Calcium: 9.7 mg/dL (ref 8.9–10.3)
Chloride: 98 mmol/L (ref 98–111)
Creatinine, Ser: 0.96 mg/dL (ref 0.44–1.00)
GFR calc Af Amer: >60 mL/min (ref >60)
GFR calc non Af Amer: >60 mL/min (ref >60)
Glucose, Bld: 280 mg/dL — ABNORMAL HIGH (ref 70–99)
Potassium: 3.7 mmol/L (ref 3.5–5.1)
Sodium: 137 mmol/L (ref 135–145)
Total Bilirubin: 0.7 mg/dL (ref 0.3–1.2)
Total Protein: 8.1 g/dL (ref 6.5–8.1)

## 2019-06-14 LAB — CBC WITH DIFFERENTIAL/PLATELET
Abs Immature Granulocytes: 0.02 10*3/uL (ref 0.00–0.07)
Basophils Absolute: 0 10*3/uL (ref 0.0–0.1)
Basophils Relative: 1 %
Eosinophils Absolute: 0.2 10*3/uL (ref 0.0–0.5)
Eosinophils Relative: 4 %
HCT: 38.8 % (ref 36.0–46.0)
Hemoglobin: 13 g/dL (ref 12.0–15.0)
Immature Granulocytes: 0 %
Lymphocytes Relative: 38 %
Lymphs Abs: 2.3 10*3/uL (ref 0.7–4.0)
MCH: 28.1 pg (ref 26.0–34.0)
MCHC: 33.5 g/dL (ref 30.0–36.0)
MCV: 83.8 fL (ref 80.0–100.0)
Monocytes Absolute: 0.6 10*3/uL (ref 0.1–1.0)
Monocytes Relative: 11 %
Neutro Abs: 2.7 10*3/uL (ref 1.7–7.7)
Neutrophils Relative %: 46 %
Platelets: 242 10*3/uL (ref 150–400)
RBC: 4.63 MIL/uL (ref 3.87–5.11)
RDW: 11.8 % (ref 11.5–15.5)
WBC: 5.9 10*3/uL (ref 4.0–10.5)
nRBC: 0 % (ref 0.0–0.2)

## 2019-06-14 LAB — SARS CORONAVIRUS 2 (TAT 6-24 HRS): SARS Coronavirus 2: NEGATIVE

## 2019-06-14 LAB — GLUCOSE, CAPILLARY
Glucose-Capillary: 272 mg/dL — ABNORMAL HIGH (ref 70–99)
Glucose-Capillary: 297 mg/dL — ABNORMAL HIGH (ref 70–99)

## 2019-06-14 MED ORDER — INSULIN GLARGINE 100 UNIT/ML ~~LOC~~ SOLN
30.0000 [IU] | Freq: Every day | SUBCUTANEOUS | Status: DC
  Administered 2019-06-14: 30 [IU] via SUBCUTANEOUS
  Filled 2019-06-14 (×2): qty 0.3

## 2019-06-14 MED ORDER — ACETAMINOPHEN 325 MG PO TABS: 650.0000 mg | ORAL_TABLET | ORAL | Status: DC | PRN

## 2019-06-14 MED ORDER — INSULIN ASPART 100 UNIT/ML ~~LOC~~ SOLN
4.0000 [IU] | Freq: Three times a day (TID) | SUBCUTANEOUS | Status: DC
  Administered 2019-06-14 – 2019-06-15 (×3): 4 [IU] via SUBCUTANEOUS
  Filled 2019-06-14 (×3): qty 1

## 2019-06-14 MED ORDER — LISINOPRIL 10 MG PO TABS
40.0000 mg | ORAL_TABLET | Freq: Every day | ORAL | Status: DC
  Administered 2019-06-15: 40 mg via ORAL
  Filled 2019-06-14: qty 4

## 2019-06-14 MED ORDER — SENNOSIDES-DOCUSATE SODIUM 8.6-50 MG PO TABS: 1.0000 | ORAL_TABLET | Freq: Every evening | ORAL | Status: DC | PRN

## 2019-06-14 MED ORDER — TRAZODONE HCL 50 MG PO TABS
50.0000 mg | ORAL_TABLET | Freq: Every evening | ORAL | Status: DC | PRN
  Administered 2019-06-14: 50 mg via ORAL
  Filled 2019-06-14: qty 1

## 2019-06-14 MED ORDER — IOHEXOL 350 MG/ML SOLN
75.0000 mL | Freq: Once | INTRAVENOUS | Status: AC | PRN
  Administered 2019-06-14: 09:00:00 75 mL via INTRAVENOUS

## 2019-06-14 MED ORDER — HYDROCHLOROTHIAZIDE 25 MG PO TABS
25.0000 mg | ORAL_TABLET | Freq: Every day | ORAL | Status: DC
  Administered 2019-06-15: 25 mg via ORAL
  Filled 2019-06-14: qty 1

## 2019-06-14 MED ORDER — ENOXAPARIN SODIUM 40 MG/0.4ML ~~LOC~~ SOLN
40.0000 mg | SUBCUTANEOUS | Status: DC
  Administered 2019-06-14: 22:00:00 40 mg via SUBCUTANEOUS
  Filled 2019-06-14: qty 0.4

## 2019-06-14 MED ORDER — ACETAMINOPHEN 160 MG/5ML PO SOLN
650.0000 mg | ORAL | Status: DC | PRN
  Filled 2019-06-14: qty 20.3

## 2019-06-14 MED ORDER — ASPIRIN 325 MG PO TABS
325.0000 mg | ORAL_TABLET | Freq: Every day | ORAL | Status: DC
  Administered 2019-06-14 – 2019-06-15 (×2): 325 mg via ORAL
  Filled 2019-06-14 (×3): qty 1

## 2019-06-14 MED ORDER — STROKE: EARLY STAGES OF RECOVERY BOOK
Freq: Once | Status: AC
  Administered 2019-06-14: 16:00:00

## 2019-06-14 MED ORDER — ASPIRIN 300 MG RE SUPP: 300.0000 mg | Freq: Every day | RECTAL | Status: DC

## 2019-06-14 MED ORDER — INSULIN ASPART 100 UNIT/ML ~~LOC~~ SOLN
0.0000 [IU] | Freq: Every day | SUBCUTANEOUS | Status: DC
  Administered 2019-06-14: 22:00:00 3 [IU] via SUBCUTANEOUS
  Filled 2019-06-14: qty 1

## 2019-06-14 MED ORDER — INSULIN ASPART 100 UNIT/ML ~~LOC~~ SOLN
0.0000 [IU] | Freq: Three times a day (TID) | SUBCUTANEOUS | Status: DC
  Administered 2019-06-15: 2 [IU] via SUBCUTANEOUS
  Administered 2019-06-15: 09:00:00 5 [IU] via SUBCUTANEOUS
  Filled 2019-06-14 (×2): qty 1

## 2019-06-14 MED ORDER — ACETAMINOPHEN 650 MG RE SUPP: 650.0000 mg | RECTAL | Status: DC | PRN

## 2019-06-14 NOTE — H&P (Signed)
[employer] [facility] [place] [facility]    PATIENT NAME: Kelly [name]    MR#:  [id number]  DATE OF BIRTH:  07/26/1971  DATE OF ADMISSION:  06/14/2019  PRIMARY CARE PHYSICIAN: [facility]   REQUESTING/REFERRING PHYSICIAN: Dr. Dorothea Glassman  CHIEF COMPLAINT:   Chief Complaint  Patient presents with  . Tingling    HISTORY OF PRESENT ILLNESS:  Kelly [name]  is a 48 y.o. female with a known history of diabetes, hypertension who presents to the hospital due to right hand and arm numbness and tingling.  Patient says she developed the symptoms earlier this morning and therefore was a bit concerned and came to the ER for further evaluation.  Patient denies any headache, blurry vision, speech disturbance, right leg weakness or numbness or any other associated symptoms.  Patient underwent a CT scan of the head and a CTA of the head and neck which were negative for acute pathology.  But given her persistent symptoms and concern for underlying stroke hospital services were contacted for admission.  Patient denies any fevers chills cough shortness of breath any recent travel history or sick contacts.  Patient's COVID-19 test is still pending.  PAST MEDICAL HISTORY:   Past Medical History:  Diagnosis Date  . Diabetes mellitus without complication (HCC)   . Hypertension     PAST SURGICAL HISTORY:   Past Surgical History:  Procedure Laterality Date  . ABDOMINAL HYSTERECTOMY      SOCIAL HISTORY:   Social History   Tobacco Use  . Smoking status: Never Smoker  . Smokeless tobacco: Never Used  Substance Use Topics  . Alcohol use: Yes    Comment: Socially     FAMILY HISTORY:   Family History  Problem Relation Age of Onset  . Breast cancer Mother     DRUG ALLERGIES:  No Known Allergies  REVIEW OF SYSTEMS:   Review of Systems  Constitutional: Negative for chills and fever.  HENT: Negative for congestion and tinnitus.   Eyes: Negative  for blurred vision and double vision.  Respiratory: Negative for cough, shortness of breath and wheezing.   Cardiovascular: Negative for chest pain, orthopnea and PND.  Gastrointestinal: Negative for abdominal pain, diarrhea, nausea and vomiting.  Genitourinary: Negative for dysuria and hematuria.  Neurological: Positive for sensory change. Negative for dizziness and focal weakness.  All other systems reviewed and are negative.   MEDICATIONS AT HOME:   Prior to Admission medications   Medication Sig Start Date End Date Taking? Authorizing Provider  hydrochlorothiazide (HYDRODIURIL) 25 MG tablet Take 1 tablet (25 mg total) by mouth daily. 07/17/17  Yes McNew, Ileene Hutchinson, MD  lisinopril (PRINIVIL,ZESTRIL) 40 MG tablet Take 1 tablet (40 mg total) by mouth daily. 07/17/17 06/14/19 Yes McNew, Ileene Hutchinson, MD  insulin aspart (NOVOLOG) 100 UNIT/ML injection Inject 4 Units into the skin 3 (three) times daily with meals. Use only if eat 50% of meal 07/17/17 01/09/19  McNew, Ileene Hutchinson, MD  insulin glargine (LANTUS) 100 UNIT/ML injection Inject 0.3 mLs (30 Units total) into the skin at bedtime. 07/17/17 01/09/19  McNew, Ileene Hutchinson, MD      VITAL SIGNS:  Blood pressure 126/85, pulse 91, temperature 98.3 F (36.8 C), temperature source Oral, resp. rate 16, SpO2 100 %.  PHYSICAL EXAMINATION:  Physical Exam  GENERAL:  48 y.o.-year-old patient lying in the bed with no acute distress.  EYES: Pupils equal, round, reactive to light and accommodation. No scleral icterus. Extraocular muscles intact.  HEENT: Head atraumatic, normocephalic. Oropharynx and nasopharynx clear. No oropharyngeal erythema, moist oral mucosa  NECK:  Supple, no jugular venous distention. No thyroid enlargement, no tenderness.  LUNGS: Normal breath sounds bilaterally, no wheezing, rales, rhonchi. No use of accessory muscles of respiration.  CARDIOVASCULAR: S1, S2 RRR. No murmurs, rubs, gallops, clicks.  ABDOMEN: Soft, nontender, nondistended.  Bowel sounds present. No organomegaly or mass.  EXTREMITIES: No pedal edema, cyanosis, or clubbing. + 2 pedal & radial pulses b/l.   NEUROLOGIC: Cranial nerves II through XII are intact. No focal Motor or sensory deficits appreciated b/l. Numbness on the right hand and arm.  PSYCHIATRIC: The patient is alert and oriented x 3. Good affect.  SKIN: No obvious rash, lesion, or ulcer.   LABORATORY PANEL:   CBC Recent Labs  Lab 06/14/19 0823  WBC 5.9  HGB 13.0  HCT 38.8  PLT 242   ------------------------------------------------------------------------------------------------------------------  Chemistries  Recent Labs  Lab 06/14/19 0823  NA 137  K 3.7  CL 98  CO2 26  GLUCOSE 280*  BUN 20  CREATININE 0.96  CALCIUM 9.7  AST 25  ALT 36  ALKPHOS 73  BILITOT 0.7   ------------------------------------------------------------------------------------------------------------------  Cardiac Enzymes No results for input(s): TROPONINI in the last 168 hours. ------------------------------------------------------------------------------------------------------------------  RADIOLOGY:  Ct Angio Head W Or Wo Contrast  Result Date: 06/14/2019 CLINICAL DATA:  Stroke, follow-up. Additional history provided: Patient reports right-sided upper extremity numbness that began at approximately 04:00. EXAM: CT ANGIOGRAPHY HEAD AND NECK TECHNIQUE: Multidetector CT imaging of the head and neck was performed using the standard protocol during bolus administration of intravenous contrast. Multiplanar CT image reconstructions and MIPs were obtained to evaluate the vascular anatomy. Carotid stenosis measurements (when applicable) are obtained utilizing NASCET criteria, using the distal internal carotid diameter as the denominator. CONTRAST:  64mL OMNIPAQUE IOHEXOL 350 MG/ML SOLN COMPARISON:  Head CT 01/01/2015. FINDINGS: CT HEAD FINDINGS Brain: No evidence of acute intracranial hemorrhage. No demarcated  cortical infarction. No evidence of intracranial mass. No midline shift or extra-axial fluid collection. Cerebral volume is normal for age. Partially empty and slightly expanded sella turcica. Vascular: Reported separately. Skull: Normal. Negative for fracture or focal lesion. Sinuses: Mild scattered paranasal sinus mucosal thickening. Right maxillary sinus mucous retention cyst. No significant mastoid effusion. Orbits: Visualized orbits demonstrate no acute abnormality. ASPECTS ([place] Stroke Program Early CT Score) - Ganglionic level infarction (caudate, lentiform nuclei, internal capsule, insula, M1-M3 cortex): 7 - Supraganglionic infarction (M4-M6 cortex): 3 Total score (0-10 with 10 being normal): 10 Review of the MIP images confirms the above findings These results were called by telephone at the time of interpretation on 06/14/2019 at 9:04 am to provider Dr. Juliette Alcide, who verbally acknowledged these results. CTA NECK FINDINGS Aortic arch: Standard branching. No dissection or aneurysm at the imaged levels. Right carotid system: CCA and ICA patent within the neck without significant stenosis (50% or greater). Left carotid system: CCA and ICA patent within the neck without significant stenosis (50% or greater). Vertebral arteries: Bilateral vertebral arteries patent within the neck without significant stenosis (50% or greater). The vertebral arteries are codominant. Skeleton: No acute bony abnormality. Other neck: No soft tissue neck mass or pathologically enlarged cervical chain lymph nodes. The thyroid gland is unremarkable. Upper chest: No consolidation within the imaged lung apices. Review of the MIP images confirms the above findings CTA HEAD FINDINGS Anterior circulation: Bilateral intracranial carotid artery siphons patent without significant stenosis. Right middle and anterior cerebral arteries patent without evidence of significant  proximal stenosis. Left middle and anterior cerebral arteries patent  without evidence of significant proximal stenosis No intracranial aneurysm identified. Posterior circulation: Intracranial vertebral arteries are patent without significant stenosis. Basilar artery is patent without significant stenosis. Bilateral posterior cerebral arteries patent without significant proximal stenosis. Venous sinuses: Within limitations of contrast timing, no convincing thrombus. Anatomic variants: None significant Review of the MIP images confirms the above findings These results were called by telephone at the time of interpretation on 06/14/2019 at 9:05 am to provider Dr. Rip Harbour, who verbally acknowledged these results. IMPRESSION: CT head: No CT evidence of acute intracranial abnormality. CTA head: No intracranial large vessel occlusion or evidence of high-grade proximal arterial stenosis. CTA neck: The common carotid, internal carotid and vertebral arteries patent within the neck bilaterally without significant stenosis (50% or greater). Electronically Signed   By: Kellie Simmering   On: 06/14/2019 09:20   Ct Angio Neck W Or Wo Contrast  Result Date: 06/14/2019 CLINICAL DATA:  Stroke, follow-up. Additional history provided: Patient reports right-sided upper extremity numbness that began at approximately 04:00. EXAM: CT ANGIOGRAPHY HEAD AND NECK TECHNIQUE: Multidetector CT imaging of the head and neck was performed using the standard protocol during bolus administration of intravenous contrast. Multiplanar CT image reconstructions and MIPs were obtained to evaluate the vascular anatomy. Carotid stenosis measurements (when applicable) are obtained utilizing NASCET criteria, using the distal internal carotid diameter as the denominator. CONTRAST:  78mL OMNIPAQUE IOHEXOL 350 MG/ML SOLN COMPARISON:  Head CT 01/01/2015. FINDINGS: CT HEAD FINDINGS Brain: No evidence of acute intracranial hemorrhage. No demarcated cortical infarction. No evidence of intracranial mass. No midline shift or extra-axial  fluid collection. Cerebral volume is normal for age. Partially empty and slightly expanded sella turcica. Vascular: Reported separately. Skull: Normal. Negative for fracture or focal lesion. Sinuses: Mild scattered paranasal sinus mucosal thickening. Right maxillary sinus mucous retention cyst. No significant mastoid effusion. Orbits: Visualized orbits demonstrate no acute abnormality. ASPECTS ([place] Stroke Program Early CT Score) - Ganglionic level infarction (caudate, lentiform nuclei, internal capsule, insula, M1-M3 cortex): 7 - Supraganglionic infarction (M4-M6 cortex): 3 Total score (0-10 with 10 being normal): 10 Review of the MIP images confirms the above findings These results were called by telephone at the time of interpretation on 06/14/2019 at 9:04 am to provider Dr. Rip Harbour, who verbally acknowledged these results. CTA NECK FINDINGS Aortic arch: Standard branching. No dissection or aneurysm at the imaged levels. Right carotid system: CCA and ICA patent within the neck without significant stenosis (50% or greater). Left carotid system: CCA and ICA patent within the neck without significant stenosis (50% or greater). Vertebral arteries: Bilateral vertebral arteries patent within the neck without significant stenosis (50% or greater). The vertebral arteries are codominant. Skeleton: No acute bony abnormality. Other neck: No soft tissue neck mass or pathologically enlarged cervical chain lymph nodes. The thyroid gland is unremarkable. Upper chest: No consolidation within the imaged lung apices. Review of the MIP images confirms the above findings CTA HEAD FINDINGS Anterior circulation: Bilateral intracranial carotid artery siphons patent without significant stenosis. Right middle and anterior cerebral arteries patent without evidence of significant proximal stenosis. Left middle and anterior cerebral arteries patent without evidence of significant proximal stenosis No intracranial aneurysm identified.  Posterior circulation: Intracranial vertebral arteries are patent without significant stenosis. Basilar artery is patent without significant stenosis. Bilateral posterior cerebral arteries patent without significant proximal stenosis. Venous sinuses: Within limitations of contrast timing, no convincing thrombus. Anatomic variants: None significant Review of the MIP images confirms  the above findings These results were called by telephone at the time of interpretation on 06/14/2019 at 9:05 am to provider Dr. Juliette AlcideMelinda, who verbally acknowledged these results. IMPRESSION: CT head: No CT evidence of acute intracranial abnormality. CTA head: No intracranial large vessel occlusion or evidence of high-grade proximal arterial stenosis. CTA neck: The common carotid, internal carotid and vertebral arteries patent within the neck bilaterally without significant stenosis (50% or greater). Electronically Signed   By: Jackey LogeKyle  Golden   On: 06/14/2019 09:20   Dg Chest Portable 1 View  Result Date: 06/14/2019 CLINICAL DATA:  Right-sided weakness EXAM: PORTABLE CHEST 1 VIEW COMPARISON:  January 04, 2019 FINDINGS: No edema or consolidation. Heart size and pulmonary vascularity are normal. No adenopathy. No bone lesions. IMPRESSION: No edema or consolidation. Electronically Signed   By: Bretta BangWilliam  Woodruff III M.D.   On: 06/14/2019 09:41     IMPRESSION AND PLAN:   48 year old female with past medical history of diabetes, hypertension who presents to the hospital due to right arm and hand numbness and tingling.  1.  TIA/CVA- suspected diagnosis given patient's neurologic symptoms as mentioned above.  CTA of the head and neck and CT head is negative for any acute stroke. - Seen by neurology and will get MRI of the brain, echocardiogram with bubble study. - Continue aspirin, will check a lipid profile hemoglobin A1c. - We will get PT, OT and speech evaluation.  2.  Diabetes type 2 without complication- continue Lantus, NovoLog  with meals.  3.  Essential hypertension-continue lisinopril/HCTZ.    All the records are reviewed and case discussed with ED provider. Management plans discussed with the patient, family and they are in agreement.  CODE STATUS: Full code  TOTAL TIME TAKING CARE OF THIS PATIENT: 40 minutes.    Houston SirenVivek J Davontae Prusinski M.D on 06/14/2019 at 9:52 AM  Between 7am to 6pm - Pager - [phone]  After 6pm go to [email or web address] - password [email or web address]  [facility]  Office  [phone]  CC: Primary care physician; [facility]

## 2019-06-14 NOTE — Consult Note (Signed)
Referring Physician: Cinda Quest    Chief Complaint: Right sided numbness  HPI: Kelly [name] is an 48 y.o. female with a history of HTN and DM who reports awakening this morning with right hand numbness.  She reports she dressed to go to work and on the way to work noted that the right side of her face was numb and that her right leg was numb.  She noted difficulty walking and presented for evaluation.  Patient outside time window for tPA.  NIHSS of 2.    Date last known well: Date: 06/13/2019 Time last known well: Time: 22:00 tPA Given: No: Outside time window  Past Medical History:  Diagnosis Date  . Diabetes mellitus without complication ([place])   . Hypertension     Past Surgical History:  Procedure Laterality Date  . ABDOMINAL HYSTERECTOMY      Family history: Father deceased.  No information about his health.  Mother alive with history of HTN, DM and breast cancer  Social History:  reports that she has never smoked. She has never used smokeless tobacco. She reports current alcohol use. She reports that she does not use drugs.  Allergies: No Known Allergies  Medications:  Prior to Admission medications   Medication Sig Start Date End Date Taking? Authorizing Provider  hydrochlorothiazide (HYDRODIURIL) 25 MG tablet Take 1 tablet (25 mg total) by mouth daily. 07/17/17   McNew, Tyson Babinski, MD  insulin aspart (NOVOLOG) 100 UNIT/ML injection Inject 4 Units into the skin 3 (three) times daily with meals. Use only if eat 50% of meal 07/17/17 01/09/19  McNew, Tyson Babinski, MD  insulin glargine (LANTUS) 100 UNIT/ML injection Inject 0.3 mLs (30 Units total) into the skin at bedtime. 07/17/17 01/09/19  McNew, Tyson Babinski, MD  lisinopril (PRINIVIL,ZESTRIL) 40 MG tablet Take 1 tablet (40 mg total) by mouth daily. 07/17/17 01/09/19  McNew, Tyson Babinski, MD     ROS: History obtained from the patient  General ROS: negative for - chills, fatigue, fever, night sweats, weight gain or weight loss Psychological  ROS: negative for - behavioral disorder, hallucinations, memory difficulties, mood swings or suicidal ideation Ophthalmic ROS: negative for - blurry vision, double vision, eye pain or loss of vision ENT ROS: negative for - epistaxis, nasal discharge, oral lesions, sore throat, tinnitus or vertigo Allergy and Immunology ROS: negative for - hives or itchy/watery eyes Hematological and Lymphatic ROS: negative for - bleeding problems, bruising or swollen lymph nodes Endocrine ROS: negative for - galactorrhea, hair pattern changes, polydipsia/polyuria or temperature intolerance Respiratory ROS: negative for - cough, hemoptysis, shortness of breath or wheezing Cardiovascular ROS: negative for - chest pain, dyspnea on exertion, edema or irregular heartbeat Gastrointestinal ROS: negative for - abdominal pain, diarrhea, hematemesis, nausea/vomiting or stool incontinence Genito-Urinary ROS: negative for - dysuria, hematuria, incontinence or urinary frequency/urgency Musculoskeletal ROS: negative for - joint swelling or muscular weakness Neurological ROS: as noted in HPI Dermatological ROS: negative for rash and skin lesion changes  Physical Examination: Blood pressure 126/85, pulse 91, temperature 98.3 F (36.8 C), temperature source Oral, resp. rate 16, SpO2 100 %.  HEENT-  Normocephalic, no lesions, without obvious abnormality.  Normal external eye and conjunctiva.  Normal TM's bilaterally.  Normal auditory canals and external ears. Normal external nose, mucus membranes and septum.  Normal pharynx. Cardiovascular- S1, S2 normal, pulses palpable throughout   Lungs- chest clear, no wheezing, rales, normal symmetric air entry Abdomen- soft, non-tender; bowel sounds normal; no masses,  no organomegaly Extremities- no edema Lymph-no  adenopathy palpable Musculoskeletal-no joint tenderness, deformity or swelling Skin-warm and dry, no hyperpigmentation, vitiligo, or suspicious lesions  Neurological  Examination   Mental Status: Alert, oriented, thought content appropriate.  Speech fluent without evidence of aphasia.  Able to follow 3 step commands without difficulty. Cranial Nerves: II: Discs flat bilaterally; Visual fields grossly normal, pupils equal, round, reactive to light and accommodation III,IV, VI: ptosis not present, extra-ocular motions intact bilaterally V,VII: smile symmetric, facial light touch sensation normal bilaterally VIII: hearing normal bilaterally IX,X: gag reflex present XI: bilateral shoulder shrug XII: midline tongue extension Motor: 5-/5 strength in the RLE.  Decreased right hand grip with 5/5 strength otherwise in the BUE's Sensory: Pinprick and light touch decreased in the RUE Deep Tendon Reflexes: Symmetric throughout Plantars: Right: downgoing   Left: downgoing Cerebellar: Normal finger-to-nose and normal heel-to-shin testing bilaterally Gait: not tested due to safety concerns   Laboratory Studies:  Basic Metabolic Panel: Recent Labs  Lab 06/14/19 0823  NA 137  K 3.7  CL 98  CO2 26  GLUCOSE 280*  BUN 20  CREATININE 0.96  CALCIUM 9.7    Liver Function Tests: Recent Labs  Lab 06/14/19 0823  AST 25  ALT 36  ALKPHOS 73  BILITOT 0.7  PROT 8.1  ALBUMIN 3.9   No results for input(s): LIPASE, AMYLASE in the last 168 hours. No results for input(s): AMMONIA in the last 168 hours.  CBC: Recent Labs  Lab 06/14/19 0823  WBC 5.9  NEUTROABS 2.7  HGB 13.0  HCT 38.8  MCV 83.8  PLT 242    Cardiac Enzymes: No results for input(s): CKTOTAL, CKMB, CKMBINDEX, TROPONINI in the last 168 hours.  BNP: Invalid input(s): POCBNP  CBG: No results for input(s): GLUCAP in the last 168 hours.  Microbiology: Results for orders placed or performed during the hospital encounter of 04/20/17  Urine Culture     Status: Abnormal   Collection Time: 04/20/17  6:49 PM   Specimen: Urine, Random  Result Value Ref Range Status   Specimen Description  URINE, RANDOM  Final   Special Requests NONE  Final   Culture >=100,000 COLONIES/mL ESCHERICHIA COLI (A)  Final   Report Status 04/23/2017 FINAL  Final   Organism ID, Bacteria ESCHERICHIA COLI (A)  Final      Susceptibility   Escherichia coli - MIC*    AMPICILLIN <=2 SENSITIVE Sensitive     CEFAZOLIN <=4 SENSITIVE Sensitive     CEFTRIAXONE <=1 SENSITIVE Sensitive     CIPROFLOXACIN <=0.25 SENSITIVE Sensitive     GENTAMICIN <=1 SENSITIVE Sensitive     IMIPENEM <=0.25 SENSITIVE Sensitive     NITROFURANTOIN <=16 SENSITIVE Sensitive     TRIMETH/SULFA <=20 SENSITIVE Sensitive     AMPICILLIN/SULBACTAM <=2 SENSITIVE Sensitive     PIP/TAZO <=4 SENSITIVE Sensitive     Extended ESBL NEGATIVE Sensitive     * >=100,000 COLONIES/mL ESCHERICHIA COLI    Coagulation Studies: No results for input(s): LABPROT, INR in the last 72 hours.  Urinalysis: No results for input(s): COLORURINE, LABSPEC, PHURINE, GLUCOSEU, HGBUR, BILIRUBINUR, KETONESUR, PROTEINUR, UROBILINOGEN, NITRITE, LEUKOCYTESUR in the last 168 hours.  Invalid input(s): APPERANCEUR  Lipid Panel:    Component Value Date/Time   CHOL 143 07/12/2017 0720   TRIG 121 07/12/2017 0720   HDL 35 (L) 07/12/2017 0720   CHOLHDL 4.1 07/12/2017 0720   VLDL 24 07/12/2017 0720   LDLCALC 84 07/12/2017 0720    HgbA1C:  Lab Results  Component Value Date   HGBA1C  11.4 (H) 07/12/2017    Urine Drug Screen:      Component Value Date/Time   LABOPIA NONE DETECTED 07/11/2017 1627   COCAINSCRNUR NONE DETECTED 07/11/2017 1627   LABBENZ NONE DETECTED 07/11/2017 1627   AMPHETMU NONE DETECTED 07/11/2017 1627   THCU NONE DETECTED 07/11/2017 1627   LABBARB NONE DETECTED 07/11/2017 1627    Alcohol Level: No results for input(s): ETH in the last 168 hours.   Imaging: Ct Angio Head W Or Wo Contrast  Result Date: 06/14/2019 CLINICAL DATA:  Stroke, follow-up. Additional history provided: Patient reports right-sided upper extremity numbness that began at  approximately 04:00. EXAM: CT ANGIOGRAPHY HEAD AND NECK TECHNIQUE: Multidetector CT imaging of the head and neck was performed using the standard protocol during bolus administration of intravenous contrast. Multiplanar CT image reconstructions and MIPs were obtained to evaluate the vascular anatomy. Carotid stenosis measurements (when applicable) are obtained utilizing NASCET criteria, using the distal internal carotid diameter as the denominator. CONTRAST:  56mL OMNIPAQUE IOHEXOL 350 MG/ML SOLN COMPARISON:  Head CT 01/01/2015. FINDINGS: CT HEAD FINDINGS Brain: No evidence of acute intracranial hemorrhage. No demarcated cortical infarction. No evidence of intracranial mass. No midline shift or extra-axial fluid collection. Cerebral volume is normal for age. Partially empty and slightly expanded sella turcica. Vascular: Reported separately. Skull: Normal. Negative for fracture or focal lesion. Sinuses: Mild scattered paranasal sinus mucosal thickening. Right maxillary sinus mucous retention cyst. No significant mastoid effusion. Orbits: Visualized orbits demonstrate no acute abnormality. ASPECTS ([place] Stroke Program Early CT Score) - Ganglionic level infarction (caudate, lentiform nuclei, internal capsule, insula, M1-M3 cortex): 7 - Supraganglionic infarction (M4-M6 cortex): 3 Total score (0-10 with 10 being normal): 10 Review of the MIP images confirms the above findings These results were called by telephone at the time of interpretation on 06/14/2019 at 9:04 am to provider Dr. Juliette Alcide, who verbally acknowledged these results. CTA NECK FINDINGS Aortic arch: Standard branching. No dissection or aneurysm at the imaged levels. Right carotid system: CCA and ICA patent within the neck without significant stenosis (50% or greater). Left carotid system: CCA and ICA patent within the neck without significant stenosis (50% or greater). Vertebral arteries: Bilateral vertebral arteries patent within the neck without  significant stenosis (50% or greater). The vertebral arteries are codominant. Skeleton: No acute bony abnormality. Other neck: No soft tissue neck mass or pathologically enlarged cervical chain lymph nodes. The thyroid gland is unremarkable. Upper chest: No consolidation within the imaged lung apices. Review of the MIP images confirms the above findings CTA HEAD FINDINGS Anterior circulation: Bilateral intracranial carotid artery siphons patent without significant stenosis. Right middle and anterior cerebral arteries patent without evidence of significant proximal stenosis. Left middle and anterior cerebral arteries patent without evidence of significant proximal stenosis No intracranial aneurysm identified. Posterior circulation: Intracranial vertebral arteries are patent without significant stenosis. Basilar artery is patent without significant stenosis. Bilateral posterior cerebral arteries patent without significant proximal stenosis. Venous sinuses: Within limitations of contrast timing, no convincing thrombus. Anatomic variants: None significant Review of the MIP images confirms the above findings These results were called by telephone at the time of interpretation on 06/14/2019 at 9:05 am to provider Dr. Juliette Alcide, who verbally acknowledged these results. IMPRESSION: CT head: No CT evidence of acute intracranial abnormality. CTA head: No intracranial large vessel occlusion or evidence of high-grade proximal arterial stenosis. CTA neck: The common carotid, internal carotid and vertebral arteries patent within the neck bilaterally without significant stenosis (50% or greater). Electronically Signed  By: Jackey LogeKyle  Golden   On: 06/14/2019 09:20   Ct Angio Neck W Or Wo Contrast  Result Date: 06/14/2019 CLINICAL DATA:  Stroke, follow-up. Additional history provided: Patient reports right-sided upper extremity numbness that began at approximately 04:00. EXAM: CT ANGIOGRAPHY HEAD AND NECK TECHNIQUE: Multidetector CT  imaging of the head and neck was performed using the standard protocol during bolus administration of intravenous contrast. Multiplanar CT image reconstructions and MIPs were obtained to evaluate the vascular anatomy. Carotid stenosis measurements (when applicable) are obtained utilizing NASCET criteria, using the distal internal carotid diameter as the denominator. CONTRAST:  75mL OMNIPAQUE IOHEXOL 350 MG/ML SOLN COMPARISON:  Head CT 01/01/2015. FINDINGS: CT HEAD FINDINGS Brain: No evidence of acute intracranial hemorrhage. No demarcated cortical infarction. No evidence of intracranial mass. No midline shift or extra-axial fluid collection. Cerebral volume is normal for age. Partially empty and slightly expanded sella turcica. Vascular: Reported separately. Skull: Normal. Negative for fracture or focal lesion. Sinuses: Mild scattered paranasal sinus mucosal thickening. Right maxillary sinus mucous retention cyst. No significant mastoid effusion. Orbits: Visualized orbits demonstrate no acute abnormality. ASPECTS ([place] Stroke Program Early CT Score) - Ganglionic level infarction (caudate, lentiform nuclei, internal capsule, insula, M1-M3 cortex): 7 - Supraganglionic infarction (M4-M6 cortex): 3 Total score (0-10 with 10 being normal): 10 Review of the MIP images confirms the above findings These results were called by telephone at the time of interpretation on 06/14/2019 at 9:04 am to provider Dr. Juliette AlcideMelinda, who verbally acknowledged these results. CTA NECK FINDINGS Aortic arch: Standard branching. No dissection or aneurysm at the imaged levels. Right carotid system: CCA and ICA patent within the neck without significant stenosis (50% or greater). Left carotid system: CCA and ICA patent within the neck without significant stenosis (50% or greater). Vertebral arteries: Bilateral vertebral arteries patent within the neck without significant stenosis (50% or greater). The vertebral arteries are codominant. Skeleton: No  acute bony abnormality. Other neck: No soft tissue neck mass or pathologically enlarged cervical chain lymph nodes. The thyroid gland is unremarkable. Upper chest: No consolidation within the imaged lung apices. Review of the MIP images confirms the above findings CTA HEAD FINDINGS Anterior circulation: Bilateral intracranial carotid artery siphons patent without significant stenosis. Right middle and anterior cerebral arteries patent without evidence of significant proximal stenosis. Left middle and anterior cerebral arteries patent without evidence of significant proximal stenosis No intracranial aneurysm identified. Posterior circulation: Intracranial vertebral arteries are patent without significant stenosis. Basilar artery is patent without significant stenosis. Bilateral posterior cerebral arteries patent without significant proximal stenosis. Venous sinuses: Within limitations of contrast timing, no convincing thrombus. Anatomic variants: None significant Review of the MIP images confirms the above findings These results were called by telephone at the time of interpretation on 06/14/2019 at 9:05 am to provider Dr. Juliette AlcideMelinda, who verbally acknowledged these results. IMPRESSION: CT head: No CT evidence of acute intracranial abnormality. CTA head: No intracranial large vessel occlusion or evidence of high-grade proximal arterial stenosis. CTA neck: The common carotid, internal carotid and vertebral arteries patent within the neck bilaterally without significant stenosis (50% or greater). Electronically Signed   By: Jackey LogeKyle  Golden   On: 06/14/2019 09:20   Dg Chest Portable 1 View  Result Date: 06/14/2019 CLINICAL DATA:  Right-sided weakness EXAM: PORTABLE CHEST 1 VIEW COMPARISON:  January 04, 2019 FINDINGS: No edema or consolidation. Heart size and pulmonary vascularity are normal. No adenopathy. No bone lesions. IMPRESSION: No edema or consolidation. Electronically Signed   By: Bretta BangWilliam  Woodruff III  M.D.   On:  06/14/2019 09:41    Assessment: 48 y.o. female with a history of HTN and DM presenting with right sided focal neurological complaints.  Patient outside time window for tPA.  Head CT reviewed and shows no acute changes.  CTA shows no evidence of LVO or hemodynamically significant stenosis.  Can not rule out the possibility of a small lacunar infarct.  Further work up recommended.    Stroke Risk Factors - diabetes mellitus and hypertension  Plan: 1. HgbA1c, fasting lipid panel 2. MRI of the brain without contrast 3. PT consult, OT consult, Speech consult 4. Echocardiogram 5. Prophylactic therapy-Dual antiplatelet therapy with ASA  and Plavix  for three weeks with change to ASA  alone as monotherapy after that time. 6. Frequent neuro checks 7. NPO until RN stroke swallow screen 8. Telemetry monitoring  Case discussed with Dr. Jory Ee, MD Neurology [phone] 06/14/2019, 10:15 AM

## 2019-06-14 NOTE — ED Notes (Signed)
Pt back from ct, dr Doy Mince at bedside

## 2019-06-14 NOTE — Progress Notes (Signed)
Notify Dr. Brett Albino about patient's blood sugar at 297 no bedtime coverage, order given. Also patient is requesting a sleeping aid, order given. RN will continue to monitor.

## 2019-06-14 NOTE — ED Notes (Signed)
MRI on phone to complete screening at this time.

## 2019-06-14 NOTE — ED Notes (Signed)
Pt given a phone and assisted with calling her work place and daughter

## 2019-06-14 NOTE — ED Notes (Signed)
To room to check on pt, pt voices no needs or c/o at this time.  Pt asking if she is allowed to eat, informed pt that I would release her diet order so that she will receive a lunch tray.  Call bell in reach, NAD noted at this time.

## 2019-06-14 NOTE — ED Notes (Signed)
Code  Stroke  Called  To [phone] 

## 2019-06-14 NOTE — ED Provider Notes (Signed)
[facility] Emergency Department Provider Note   ____________________________________________   First MD Initiated Contact with Patient 06/14/19 [phone]     (approximate)  I have reviewed the triage vital signs and the nursing notes.   HISTORY  Chief Complaint Tingling    HPI Kelly [name] is a 48 y.o. female who reports she woke up this morning with some tingling in her right hand.  This apparently has gotten somewhat worse and now she is tingling in the right arm shows little bit of right leg weakness in the right side of her face and neck do not feel normal.  Vision is normal she says that her speech is normal.  Patient does have a history of hypertension and diabetes.         Past Medical History:  Diagnosis Date  . Diabetes mellitus without complication ([place])   . Hypertension     Patient Active Problem List   Diagnosis Date Noted  . 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 Diabetes mellitus without complication ([place]) 19/62/2297  . Hypertension 07/11/2017  . Anxiety 01/19/2012    Past Surgical History:  Procedure Laterality Date  . ABDOMINAL HYSTERECTOMY      Prior to Admission medications   Medication Sig Start Date End Date Taking? Authorizing Provider  hydrochlorothiazide (HYDRODIURIL) 25 MG tablet Take 1 tablet (25 mg total) by mouth daily. 07/17/17   McNew, Tyson Babinski, MD  insulin aspart (NOVOLOG) 100 UNIT/ML injection Inject 4 Units into the skin 3 (three) times daily with meals. Use only if eat 50% of meal 07/17/17 01/09/19  McNew, Tyson Babinski, MD  insulin glargine (LANTUS) 100 UNIT/ML injection Inject 0.3 mLs (30 Units total) into the skin at bedtime. 07/17/17 01/09/19  McNew, Tyson Babinski, MD  lisinopril (PRINIVIL,ZESTRIL) 40 MG tablet Take 1 tablet (40 mg total) by mouth daily. 07/17/17 01/09/19  McNew, Tyson Babinski, MD    Allergies Patient has no known allergies.  No family history on file.  Social  History Social History   Tobacco Use  . Smoking status: Never Smoker  . Smokeless tobacco: Never Used  Substance Use Topics  . Alcohol use: No  . Drug use: No    Review of Systems  Constitutional: No fever/chills Eyes: No visual changes. ENT: No sore throat. Cardiovascular: Denies chest pain. Respiratory: Denies shortness of breath. Gastrointestinal: No abdominal pain.  No nausea, no vomiting.  No diarrhea.  No constipation. Genitourinary: Negative for dysuria. Musculoskeletal: Negative for back pain. Skin: Negative for rash. Neurological: Negative for headaches  ____________________________________________   PHYSICAL EXAM:  VITAL SIGNS: ED Triage Vitals  Enc Vitals Group     BP 06/14/19 0822 126/85     Pulse Rate 06/14/19 0822 91     Resp 06/14/19 0822 16     Temp 06/14/19 0822 98.3 F (36.8 C)     Temp Source 06/14/19 0822 Oral     SpO2 06/14/19 0822 100 %     Weight --      Height --      Head Circumference --      Peak Flow --      Pain Score 06/14/19 0823 0     Pain Loc --      Pain Edu? --      Excl. in [place]? --     Constitutional: Alert and oriented. Well appearing and in no acute distress. Eyes: Conjunctivae are normal. PER. EOMI. Head: Atraumatic. Nose: No congestion/rhinnorhea. Mouth/Throat:  Mucous membranes are moist.  Oropharynx non-erythematous. Neck: No stridor.  Cardiovascular: Normal rate, regular rhythm. Grossly normal heart sounds.  Good peripheral circulation. Respiratory: Normal respiratory effort.  No retractions. Lungs CTAB. Gastrointestinal: Soft and nontender. No distention. No abdominal bruits. No CVA tenderness. Musculoskeletal: No lower extremity tenderness nor edema.   Neurologic:  Normal speech and language.  Patient reports right hand and arm tingling right side of her face and neck do not feel normal and she does have a little bit of weakness barely perceptible on the right leg.  There is no ataxia to rapid alternating movements  and hands or finger-to-nose Skin:  Skin is warm, dry and intact. No rash noted.   ____________________________________________   LABS (all labs ordered are listed, but only abnormal results are displayed)  Labs Reviewed  COMPREHENSIVE METABOLIC PANEL - Abnormal; Notable for the following components:      Result Value   Glucose, Bld 280 (*)    All other components within normal limits  SARS CORONAVIRUS 2 (TAT 6-24 HRS)  CBC WITH DIFFERENTIAL/PLATELET   ____________________________________________  EKG  EKG read interpreted by me shows normal sinus rhythm rate of 87 normal axis no acute ST-T wave changes ____________________________________________  RADIOLOGY  ED MD interpretation: CT of the head and CT angios read by radiology reviewed by me show no acute disease radiologist called me with this report.  He is going to go back and look at the small vessels on the CTA.  Official radiology report(s): No results found.  ____________________________________________   PROCEDURES  Procedure(s) performed (including Critical Care):  Procedures   ____________________________________________   INITIAL IMPRESSION / ASSESSMENT AND PLAN / ED COURSE  Kelly [name] was evaluated in Emergency Department on 06/14/2019 for the symptoms described in the history of present illness. She was evaluated in the context of the global COVID-19 pandemic, which necessitated consideration that the patient might be at risk for infection with the SARS-CoV-2 virus that causes COVID-19. Institutional protocols and algorithms that pertain to the evaluation of patients at risk for COVID-19 are in a state of rapid change based on information released by regulatory bodies including the CDC and federal and state organizations. These policies and algorithms were followed during the patient's care in the ED.     Patient seen by Dr. Thad Ranger neurology.  We will get her in the hospital.  (The patient that it  is).  Start her on aspirin and continue her work-up.  She is at high risk with both diabetes and hypertension.         ____________________________________________   FINAL CLINICAL IMPRESSION(S) / ED DIAGNOSES  Final diagnoses:  Cerebrovascular accident (CVA), unspecified mechanism [facility])     ED Discharge Orders    None       Note:  This document was prepared using Dragon voice recognition software and may include unintentional dictation errors.    Arnaldo Natal, MD 06/14/19 [phone]

## 2019-06-14 NOTE — ED Notes (Signed)
Pt in MRI, lunch tray left in room, pt has visitor in room, updated visitor on pt being in MRI and that she is able to eat when she returns.

## 2019-06-14 NOTE — ED Triage Notes (Signed)
Pt states that when she woke up this am she was having right hand tingling, and right arm tingling, pt is moving her head around, and states that the right side of her neck and rt arm just don't feel right, pt states that when she walking at the bus stop she felt like she was stumbling. Denies headache. fsbs 338. Pt states that she felt fine going to bed last pm

## 2019-06-14 NOTE — ED Notes (Signed)
Back to room to verify pt's desire to sign out AMA, again discussed risk of doing so, pt states at this time, she is willing to stay, but would like to use a phone.  Phone provided for pt.  Pt changed back into hospital gown at this time.

## 2019-06-14 NOTE — Progress Notes (Signed)
CODE STROKE- PHARMACY COMMUNICATION   Time CODE STROKE called/page received:[id number]  Time response to CODE STROKE was made (in person or via phone): [phone]  Time Stroke Kit retrieved from [place] (only if needed): NA  Name of Provider/Nurse contacted: Pt outside of tPA window, call to [place], RN - no tPA  Past Medical History:  Diagnosis Date  . Diabetes mellitus without complication ([place])   . Hypertension    Prior to Admission medications   Medication Sig Start Date End Date Taking? Authorizing Provider  hydrochlorothiazide (HYDRODIURIL) 25 MG tablet Take 1 tablet (25 mg total) by mouth daily. 07/17/17   McNew, Tyson Babinski, MD  insulin aspart (NOVOLOG) 100 UNIT/ML injection Inject 4 Units into the skin 3 (three) times daily with meals. Use only if eat 50% of meal 07/17/17 01/09/19  McNew, Tyson Babinski, MD  insulin glargine (LANTUS) 100 UNIT/ML injection Inject 0.3 mLs (30 Units total) into the skin at bedtime. 07/17/17 01/09/19  McNew, Tyson Babinski, MD  lisinopril (PRINIVIL,ZESTRIL) 40 MG tablet Take 1 tablet (40 mg total) by mouth daily. 07/17/17 01/09/19  McNew, Tyson Babinski, MD    Tawnya Crook ,PharmD Clinical Pharmacist  06/14/2019  8:47 AM

## 2019-06-14 NOTE — ED Notes (Signed)
Dr Doy Mince back to bedside to discuss ct results and ongoing orders and plans for treatments

## 2019-06-14 NOTE — ED Notes (Signed)
Pt states that she is feeling much better, just cont to have some tingling in her rt edge of her hand

## 2019-06-14 NOTE — ED Notes (Addendum)
Pt returned from MRI called to room by pt. Pt states that she wants to leave at this time that she is feeling better and does not want to wait in the ER.  Explained to pt the need for further testing and reasons for staying in the hospital.  Pt states she just wants to leave and that she will return if she gets worse.  Dr. Verdell Carmine notified of same.  Will see if she can be discharged, or if pt will need to sign AMA.

## 2019-06-15 ENCOUNTER — Observation Stay (HOSPITAL_BASED_OUTPATIENT_CLINIC_OR_DEPARTMENT_OTHER)
Admit: 2019-06-15 | Discharge: 2019-06-15 | Disposition: A | Discharge status: Home / Self Care | Payer: Medicaid Other | Attending: Specialist | Admitting: Specialist

## 2019-06-15 DIAGNOSIS — F331 Major depressive disorder, recurrent, moderate: Secondary | ICD-10-CM | POA: Diagnosis not present

## 2019-06-15 DIAGNOSIS — G459 Transient cerebral ischemic attack, unspecified: Secondary | ICD-10-CM

## 2019-06-15 DIAGNOSIS — I6389 Other cerebral infarction: Secondary | ICD-10-CM | POA: Diagnosis not present

## 2019-06-15 LAB — LIPID PANEL
Cholesterol: 149 mg/dL (ref 0–200)
HDL: 36 mg/dL — ABNORMAL LOW (ref >40)
LDL Cholesterol: 76 mg/dL (ref 0–99)
Total CHOL/HDL Ratio: 4.1 RATIO
Triglycerides: 185 mg/dL — ABNORMAL HIGH (ref <150)
VLDL: 37 mg/dL (ref 0–40)

## 2019-06-15 LAB — HIV ANTIBODY (ROUTINE TESTING W REFLEX): HIV Screen 4th Generation wRfx: NONREACTIVE

## 2019-06-15 LAB — GLUCOSE, CAPILLARY
Glucose-Capillary: 255 mg/dL — ABNORMAL HIGH (ref 70–99)
Glucose-Capillary: 189 mg/dL — ABNORMAL HIGH (ref 70–99)

## 2019-06-15 MED ORDER — PERFLUTREN LIPID MICROSPHERE
1.0000 mL | INTRAVENOUS | Status: AC | PRN
  Administered 2019-06-15: 4 mL via INTRAVENOUS
  Filled 2019-06-15: qty 10

## 2019-06-15 MED ORDER — INFLUENZA VAC SPLIT QUAD 0.5 ML IM SUSY: 0.5000 mL | PREFILLED_SYRINGE | INTRAMUSCULAR | Status: DC

## 2019-06-15 MED ORDER — LISINOPRIL 40 MG PO TABS: 40.0000 mg | ORAL_TABLET | Freq: Every day | ORAL | 0 refills | Status: DC

## 2019-06-15 MED ORDER — CITALOPRAM HYDROBROMIDE 20 MG PO TABS: 20.0000 mg | ORAL_TABLET | Freq: Every day | ORAL | Status: DC

## 2019-06-15 MED ORDER — ASPIRIN EC 81 MG PO TBEC: 81.0000 mg | DELAYED_RELEASE_TABLET | Freq: Every day | ORAL | 0 refills | Status: AC

## 2019-06-15 MED ORDER — INFLUENZA VAC SPLIT QUAD 0.5 ML IM SUSY
0.5000 mL | PREFILLED_SYRINGE | Freq: Once | INTRAMUSCULAR | Status: AC
  Administered 2019-06-15: 0.5 mL via INTRAMUSCULAR
  Filled 2019-06-15: qty 0.5

## 2019-06-15 MED ORDER — CLOPIDOGREL BISULFATE 75 MG PO TABS: 75.0000 mg | ORAL_TABLET | Freq: Every day | ORAL | 0 refills | Status: AC

## 2019-06-15 MED ORDER — CITALOPRAM HYDROBROMIDE 10 MG PO TABS: ORAL_TABLET | ORAL | 0 refills | Status: DC

## 2019-06-15 MED ORDER — ATORVASTATIN CALCIUM 40 MG PO TABS: 40.0000 mg | ORAL_TABLET | Freq: Every day | ORAL | 0 refills | Status: DC

## 2019-06-15 MED ORDER — HYDROCHLOROTHIAZIDE 12.5 MG PO TABS: 12.5000 mg | ORAL_TABLET | Freq: Every day | ORAL | 0 refills | Status: DC

## 2019-06-15 NOTE — Progress Notes (Signed)
OT Screen Note  Patient Details Name: Kelly [name] MRN: [id number] DOB: 1971/05/24   OT Screen:    Reason Eval/Treat Not Completed: OT screened, no needs identified, will sign off. Order received, chart reviewed. Pt back to baseline functional independence. Reporting ambulating in room without assistance. Feels she would be able to complete daily routines despite some remaining R handed weakness. No skilled OT needs identified at this time. Will sign off. Please re-consult if additional OT needs arise during this admission.   Shara Blazing, M.S., OTR/L Ascom: [phone] 06/15/19, 8:44 AM

## 2019-06-15 NOTE — Consult Note (Addendum)
[facility]   Reason for Consult:  Depression  Referring Physician:  Dr Margaretmary EddyWhitting Patient Identification: Kelly [name] MRN:  [id number] Principal Diagnosis: CVA Diagnosis:  Active Problems:   Major depressive disorder, recurrent episode, moderate (HCC)   CVA (cerebral vascular accident) (HCC)   Total Time spent with patient: 1 hour  Subjective:   Kelly [name] is a 48 y.o. female patient admitted with CVA, consult placed for depression.  Patient seen and evaluated in person by this provider.  She reports depression at 6/10 with no suicidal ideations, no homicidal ideations, hallucinations, or substance abuse.  Lives with her daughter and this is going well.  Sleep initiation is poor at times-maintenance is "good", appetite is "good".  Anxiety high at times, average of 5/10.  No panic attacks.  She was going to [facility] in the past, resources placed in discharge instructions.  Discussed medications and recommended Celexa, agreeable to start and follow up with [facility].  Medical discharge today.  HPI per MD:  Kelly [name]  is a 48 y.o. female with a known history of diabetes, hypertension who presents to the hospital due to right hand and arm numbness and tingling.  Patient says she developed the symptoms earlier this morning and therefore was a bit concerned and came to the ER for further evaluation.  Patient denies any headache, blurry vision, speech disturbance, right leg weakness or numbness or any other associated symptoms.  Patient underwent a CT scan of the head and a CTA of the head and neck which were negative for acute pathology.  But given her persistent symptoms and concern for underlying stroke hospital services were contacted for admission.  Past Psychiatric History: depression  Risk to Self:  none Risk to Others:  none Prior Inpatient Therapy:  yes Prior Outpatient Therapy:  yes  Past Medical History:  Past Medical History:  Diagnosis Date  . Diabetes  mellitus without complication (HCC)   . Hypertension     Past Surgical History:  Procedure Laterality Date  . ABDOMINAL HYSTERECTOMY     Family History:  Family History  Problem Relation Age of Onset  . Breast cancer Mother    Family Psychiatric  History: none Social History:  Social History   Substance and Sexual Activity  Alcohol Use Yes   Comment: Socially      Social History   Substance and Sexual Activity  Drug Use No    Social History   Socioeconomic History  . Marital status: Single    Spouse name: Not on file  . Number of children: Not on file  . Years of education: Not on file  . Highest education level: Not on file  Occupational History  . Not on file  Social Needs  . Financial resource strain: Not on file  . Food insecurity    Worry: Not on file    Inability: Not on file  . Transportation needs    Medical: Not on file    Non-medical: Not on file  Tobacco Use  . Smoking status: Never Smoker  . Smokeless tobacco: Never Used  Substance and Sexual Activity  . Alcohol use: Yes    Comment: Socially   . Drug use: No  . Sexual activity: Not on file  Lifestyle  . Physical activity    Days per week: Not on file    Minutes per session: Not on file  . Stress: Not on file  Relationships  . Social Musicianconnections    Talks on  phone: Not on file    Gets together: Not on file    Attends religious service: Not on file    Active member of club or organization: Not on file    Attends meetings of clubs or organizations: Not on file    Relationship status: Not on file  Other Topics Concern  . Not on file  Social History Narrative   Daughter lives with patient   Additional Social History:    Allergies:  No Known Allergies  Labs:  Results for orders placed or performed during the hospital encounter of 06/14/19 (from the past 48 hour(s))  Comprehensive metabolic panel     Status: Abnormal   Collection Time: 06/14/19  8:23 AM  Result Value Ref Range    Sodium 137 135 - 145 mmol/L   Potassium 3.7 3.5 - 5.1 mmol/L   Chloride 98 98 - 111 mmol/L   CO2 26 22 - 32 mmol/L   Glucose, Bld 280 (H) 70 - 99 mg/dL   BUN 20 6 - 20 mg/dL   Creatinine, Ser 1.61 0.44 - 1.00 mg/dL   Calcium 9.7 8.9 - 09.6 mg/dL   Total Protein 8.1 6.5 - 8.1 g/dL   Albumin 3.9 3.5 - 5.0 g/dL   AST 25 15 - 41 U/L   ALT 36 0 - 44 U/L   Alkaline Phosphatase 73 38 - 126 U/L   Total Bilirubin 0.7 0.3 - 1.2 mg/dL   GFR calc non Af Amer >60 >60 mL/min   GFR calc Af Amer >60 >60 mL/min   Anion gap 13 5 - 15    Comment: Performed at [facility], [address] [place], [place] [address]  CBC with Differential     Status: None   Collection Time: 06/14/19  8:23 AM  Result Value Ref Range   WBC 5.9 4.0 - 10.5 K/uL   RBC 4.63 3.87 - 5.11 MIL/uL   Hemoglobin 13.0 12.0 - 15.0 g/dL   HCT 98.1 19.1 - 47.8 %   MCV 83.8 80.0 - 100.0 fL   MCH 28.1 26.0 - 34.0 pg   MCHC 33.5 30.0 - 36.0 g/dL   RDW 29.5 62.1 - 30.8 %   Platelets 242 150 - 400 K/uL   nRBC 0.0 0.0 - 0.2 %   Neutrophils Relative % 46 %   Neutro Abs 2.7 1.7 - 7.7 K/uL   Lymphocytes Relative 38 %   Lymphs Abs 2.3 0.7 - 4.0 K/uL   Monocytes Relative 11 %   Monocytes Absolute 0.6 0.1 - 1.0 K/uL   Eosinophils Relative 4 %   Eosinophils Absolute 0.2 0.0 - 0.5 K/uL   Basophils Relative 1 %   Basophils Absolute 0.0 0.0 - 0.1 K/uL   Immature Granulocytes 0 %   Abs Immature Granulocytes 0.02 0.00 - 0.07 K/uL    Comment: Performed at [facility], [address] [place], [place] [address]  SARS CORONAVIRUS 2 (TAT 6-24 HRS) Nasopharyngeal Nasopharyngeal Swab     Status: None   Collection Time: 06/14/19  9:28 AM   Specimen: Nasopharyngeal Swab  Result Value Ref Range   SARS Coronavirus 2 NEGATIVE NEGATIVE    Comment: (NOTE) SARS-CoV-2 target nucleic acids are NOT DETECTED. The SARS-CoV-2 RNA is generally detectable in upper and lower respiratory specimens during the acute phase of infection.  Negative results do not preclude SARS-CoV-2 infection, do not rule out co-infections with other pathogens, and should not be used as the sole basis for treatment or  other patient management decisions. Negative results must be combined with clinical observations, patient history, and epidemiological information. The expected result is Negative. Fact Sheet for Patients: HairSlick.no Fact Sheet for Healthcare Providers: quierodirigir.com This test is not yet approved or cleared by the [place] FDA and  has been authorized for detection and/or diagnosis of SARS-CoV-2 by FDA under an Emergency Use Authorization (EUA). This EUA will remain  in effect (meaning this test can be used) for the duration of the COVID-19 declaration under Section 56 4(b)(1) of the Act, 21 U.S.C. section 360bbb-3(b)(1), unless the authorization is terminated or revoked sooner. Performed at [facility], [address], [place], [place] [address]   HIV Antibody  (Routine Testing)     Status: None   Collection Time: 06/14/19  3:19 PM  Result Value Ref Range   HIV Screen 4th Generation wRfx Non Reactive Non Reactive    Comment: (NOTE) Performed At: [facility] [address] [place], [place] [id number] Jolene Schimke MD XB:[id number]   Glucose, capillary     Status: Abnormal   Collection Time: 06/14/19  4:08 PM  Result Value Ref Range   Glucose-Capillary 272 (H) 70 - 99 mg/dL   Comment 1 Document in Chart   Glucose, capillary     Status: Abnormal   Collection Time: 06/14/19  9:22 PM  Result Value Ref Range   Glucose-Capillary 297 (H) 70 - 99 mg/dL   Comment 1 Notify RN    Comment 2 Document in Chart   Lipid panel     Status: Abnormal   Collection Time: 06/15/19  6:14 AM  Result Value Ref Range   Cholesterol 149 0 - 200 mg/dL   Triglycerides 308 (H) <150 mg/dL   HDL 36 (L) >65 mg/dL   Total CHOL/HDL Ratio 4.1 RATIO   VLDL 37 0 -  40 mg/dL   LDL Cholesterol 76 0 - 99 mg/dL    Comment:        Total Cholesterol/HDL:CHD Risk Coronary Heart Disease Risk Table                     Men   Women  1/2 Average Risk   3.4   3.3  Average Risk       5.0   4.4  2 X Average Risk   9.6   7.1  3 X Average Risk  23.4   11.0        Use the calculated Patient Ratio above and the CHD Risk Table to determine the patient's CHD Risk.        ATP III CLASSIFICATION (LDL):  <100     mg/dL   Optimal  784-696  mg/dL   Near or Above                    Optimal  130-159  mg/dL   Borderline  295-284  mg/dL   High  >132     mg/dL   Very High Performed at [facility], [address] [place], [place] [address]   Glucose, capillary     Status: Abnormal   Collection Time: 06/15/19  8:13 AM  Result Value Ref Range   Glucose-Capillary 255 (H) 70 - 99 mg/dL  Glucose, capillary     Status: Abnormal   Collection Time: 06/15/19 11:45 AM  Result Value Ref Range   Glucose-Capillary 189 (H) 70 - 99 mg/dL   Comment 1 Notify RN     Current  Facility-Administered Medications  Medication Dose Route Frequency Provider Last Rate Last Dose  . acetaminophen (TYLENOL) tablet 650 mg  650 mg Oral Q4H PRN Henreitta Leber, MD       Or  . acetaminophen (TYLENOL) solution 650 mg  650 mg Per Tube Q4H PRN Henreitta Leber, MD       Or  . acetaminophen (TYLENOL) suppository 650 mg  650 mg Rectal Q4H PRN Henreitta Leber, MD      . aspirin suppository 300 mg  300 mg Rectal Daily Henreitta Leber, MD       Or  . aspirin tablet 325 mg  325 mg Oral Daily Henreitta Leber, MD   325 mg at 06/15/19 7846  . citalopram (CELEXA) tablet 20 mg  20 mg Oral QHS Patrecia Pour, NP      . enoxaparin (LOVENOX) injection 40 mg  40 mg Subcutaneous Q24H Henreitta Leber, MD   40 mg at 06/14/19 2141  . hydrochlorothiazide (HYDRODIURIL) tablet 25 mg  25 mg Oral Daily Henreitta Leber, MD   25 mg at 06/15/19 0831  . [START ON 06/16/2019] influenza vac split quadrivalent  PF (FLUARIX) injection 0.5 mL  0.5 mL Intramuscular Tomorrow-1000 Wieting, Richard, MD      . insulin aspart (novoLOG) injection 0-5 Units  0-5 Units Subcutaneous QHS Mayo, Pete Pelt, MD   3 Units at 06/14/19 2207  . insulin aspart (novoLOG) injection 0-9 Units  0-9 Units Subcutaneous TID WC Mayo, Pete Pelt, MD   5 Units at 06/15/19 [phone]  . insulin aspart (novoLOG) injection 4 Units  4 Units Subcutaneous TID WC Henreitta Leber, MD   4 Units at 06/15/19 [phone]  . insulin glargine (LANTUS) injection 30 Units  30 Units Subcutaneous QHS Henreitta Leber, MD   30 Units at 06/14/19 2141  . lisinopril (ZESTRIL) tablet 40 mg  40 mg Oral Daily Henreitta Leber, MD   40 mg at 06/15/19 0831  . senna-docusate (Senokot-S) tablet 1 tablet  1 tablet Oral QHS PRN Henreitta Leber, MD      . traZODone (DESYREL) tablet 50 mg  50 mg Oral QHS PRN Mayo, Pete Pelt, MD   50 mg at 06/14/19 2207    Musculoskeletal: Strength & Muscle Tone: within normal limits Gait & Station: normal Patient leans: N/A  Psychiatric Specialty Exam: Physical Exam  Nursing note and vitals reviewed. Constitutional: She is oriented to person, place, and time. She appears well-developed and well-nourished.  HENT:  Head: Normocephalic.  Neck: Normal range of motion.  Respiratory: Effort normal.  Musculoskeletal: Normal range of motion.  Neurological: She is alert and oriented to person, place, and time.  Psychiatric: Her speech is normal and behavior is normal. Judgment and thought content normal. Her mood appears anxious. Cognition and memory are normal. She exhibits a depressed mood.    Review of Systems  Psychiatric/Behavioral: Positive for depression. The patient is nervous/anxious.   All other systems reviewed and are negative.   Blood pressure (!) 126/93, pulse 94, temperature 98.4 F (36.9 C), temperature source Oral, resp. rate 14, weight 79.6 kg, SpO2 100 %.Body mass index is 28.31 kg/m.  General Appearance: Casual  Eye  Contact:  Good  Speech:  Normal Rate  Volume:  Normal  Mood:  Anxious and Depressed  Affect:  Congruent  Thought Process:  Coherent and Descriptions of Associations: Intact  Orientation:  Full (Time, Place, and Person)  Thought Content:  WDL and Logical  Suicidal Thoughts:  No  Homicidal Thoughts:  No  Memory:  Immediate;   Good Recent;   Good Remote;   Good  Judgement:  Good  Insight:  Good  Psychomotor Activity:  Normal  Concentration:  Concentration: Good and Attention Span: Good  Recall:  Good  Fund of Knowledge:  Good  Language:  Good  Akathisia:  No  Handed:  Right  AIMS (if indicated):     Assets:  Leisure Time Physical Health Resilience Social Support  ADL's:  Intact  Cognition:  WNL  Sleep:        Treatment Plan Summary: Major depressive disorder, recurrent, moderate: -Started Celexa 10 mg at bedtime for one week, then increase to 20 mg daily -Resources for [employer] -Medically discharged today  Disposition: No evidence of imminent risk to self or others at present.   Patient does not meet criteria for psychiatric inpatient admission. Discussed crisis plan, support from social network, calling 911, coming to the Emergency Department, and calling Suicide Hotline.  Nanine Means, NP 06/15/2019 12:10 PM   Case discussed and plan agreed upon as outlined above by Dr. Shaune Pollack.

## 2019-06-15 NOTE — Progress Notes (Signed)
Subjective: Patient reports return to baseline.    Objective: Current vital signs: BP (!) 126/93 (BP Location: Left Arm)   Pulse 94   Temp 98.4 F (36.9 C) (Oral)   Resp 14   Wt 79.6 kg   SpO2 100%   BMI 28.31 kg/m  Vital signs in last 24 hours: Temp:  [98.4 F (36.9 C)-98.7 F (37.1 C)] 98.4 F (36.9 C) (09/26 0811) Pulse Rate:  [75-97] 94 (09/26 0811) Resp:  [14-22] 14 (09/26 0811) BP: (113-127)/(77-93) 126/93 (09/26 0811) SpO2:  [99 %-100 %] 100 % (09/26 0811) Weight:  [79.6 kg] 79.6 kg (09/26 0442)  Intake/Output from previous day: 09/25 0701 - 09/26 0700 In: 240 [P.O.:240] Out: 1100 [Urine:1100] Intake/Output this shift: Total I/O In: 240 [P.O.:240] Out: -  Nutritional status:  Diet Order            Diet heart healthy/carb modified Room service appropriate? Yes; Fluid consistency: Thin  Diet effective now              Neurologic Exam: Mental Status: Alert, oriented, thought content appropriate.  Speech fluent without evidence of aphasia.  Able to follow 3 step commands without difficulty. Cranial Nerves: II: Discs flat bilaterally; Visual fields grossly normal, pupils equal, round, reactive to light and accommodation III,IV, VI: ptosis not present, extra-ocular motions intact bilaterally V,VII: smile symmetric, facial light touch sensation normal bilaterally VIII: hearing normal bilaterally IX,X: gag reflex present XI: bilateral shoulder shrug XII: midline tongue extension Motor: 5/5 throughout Sensory: Pinprick and light touch intact throughout, bilaterally  Lab Results: Basic Metabolic Panel: Recent Labs  Lab 06/14/19 0823  NA 137  K 3.7  CL 98  CO2 26  GLUCOSE 280*  BUN 20  CREATININE 0.96  CALCIUM 9.7    Liver Function Tests: Recent Labs  Lab 06/14/19 0823  AST 25  ALT 36  ALKPHOS 73  BILITOT 0.7  PROT 8.1  ALBUMIN 3.9   No results for input(s): LIPASE, AMYLASE in the last 168 hours. No results for input(s): AMMONIA in the  last 168 hours.  CBC: Recent Labs  Lab 06/14/19 0823  WBC 5.9  NEUTROABS 2.7  HGB 13.0  HCT 38.8  MCV 83.8  PLT 242    Cardiac Enzymes: No results for input(s): CKTOTAL, CKMB, CKMBINDEX, TROPONINI in the last 168 hours.  Lipid Panel: Recent Labs  Lab 06/15/19 0614  CHOL 149  TRIG 185*  HDL 36*  CHOLHDL 4.1  VLDL 37  LDLCALC 76    CBG: Recent Labs  Lab 06/14/19 1608 06/14/19 2122 06/15/19 0813  GLUCAP 272* 297* 255*    Microbiology: Results for orders placed or performed during the hospital encounter of 06/14/19  SARS CORONAVIRUS 2 (TAT 6-24 HRS) Nasopharyngeal Nasopharyngeal Swab     Status: None   Collection Time: 06/14/19  9:28 AM   Specimen: Nasopharyngeal Swab  Result Value Ref Range Status   SARS Coronavirus 2 NEGATIVE NEGATIVE Final    Comment: (NOTE) SARS-CoV-2 target nucleic acids are NOT DETECTED. The SARS-CoV-2 RNA is generally detectable in upper and lower respiratory specimens during the acute phase of infection. Negative results do not preclude SARS-CoV-2 infection, do not rule out co-infections with other pathogens, and should not be used as the sole basis for treatment or other patient management decisions. Negative results must be combined with clinical observations, patient history, and epidemiological information. The expected result is Negative. Fact Sheet for Patients: HairSlick.nohttps://www.fda.gov/media/138098/download Fact Sheet for Healthcare Providers: quierodirigir.comhttps://www.fda.gov/media/138095/download This test is not yet  approved or cleared by the [place] and  has been authorized for detection and/or diagnosis of SARS-CoV-2 by FDA under an Emergency Use Authorization (EUA). This EUA will remain  in effect (meaning this test can be used) for the duration of the COVID-19 declaration under Section 56 4(b)(1) of the Act, 21 U.S.C. section 360bbb-3(b)(1), unless the authorization is terminated or revoked sooner. Performed at [facility], [place] [address], [place], [place] [address]     Coagulation Studies: No results for input(s): LABPROT, INR in the last 72 hours.  Imaging: Ct Angio Head W Or Wo Contrast  Result Date: 06/14/2019 CLINICAL DATA:  Stroke, follow-up. Additional history provided: Patient reports right-sided upper extremity numbness that began at approximately 04:00. EXAM: CT ANGIOGRAPHY HEAD AND NECK TECHNIQUE: Multidetector CT imaging of the head and neck was performed using the standard protocol during bolus administration of intravenous contrast. Multiplanar CT image reconstructions and MIPs were obtained to evaluate the vascular anatomy. Carotid stenosis measurements (when applicable) are obtained utilizing NASCET criteria, using the distal internal carotid diameter as the denominator. CONTRAST:  65mL OMNIPAQUE IOHEXOL 350 MG/ML SOLN COMPARISON:  Head CT 01/01/2015. FINDINGS: CT HEAD FINDINGS Brain: No evidence of acute intracranial hemorrhage. No demarcated cortical infarction. No evidence of intracranial mass. No midline shift or extra-axial fluid collection. Cerebral volume is normal for age. Partially empty and slightly expanded sella turcica. Vascular: Reported separately. Skull: Normal. Negative for fracture or focal lesion. Sinuses: Mild scattered paranasal sinus mucosal thickening. Right maxillary sinus mucous retention cyst. No significant mastoid effusion. Orbits: Visualized orbits demonstrate no acute abnormality. ASPECTS ([place] Stroke Program Early CT Score) - Ganglionic level infarction (caudate, lentiform nuclei, internal capsule, insula, M1-M3 cortex): 7 - Supraganglionic infarction (M4-M6 cortex): 3 Total score (0-10 with 10 being normal): 10 Review of the MIP images confirms the above findings These results were called by telephone at the time of interpretation on 06/14/2019 at 9:04 am to provider Dr. Rip Harbour, who verbally acknowledged these results. CTA NECK FINDINGS Aortic arch: Standard  branching. No dissection or aneurysm at the imaged levels. Right carotid system: CCA and ICA patent within the neck without significant stenosis (50% or greater). Left carotid system: CCA and ICA patent within the neck without significant stenosis (50% or greater). Vertebral arteries: Bilateral vertebral arteries patent within the neck without significant stenosis (50% or greater). The vertebral arteries are codominant. Skeleton: No acute bony abnormality. Other neck: No soft tissue neck mass or pathologically enlarged cervical chain lymph nodes. The thyroid gland is unremarkable. Upper chest: No consolidation within the imaged lung apices. Review of the MIP images confirms the above findings CTA HEAD FINDINGS Anterior circulation: Bilateral intracranial carotid artery siphons patent without significant stenosis. Right middle and anterior cerebral arteries patent without evidence of significant proximal stenosis. Left middle and anterior cerebral arteries patent without evidence of significant proximal stenosis No intracranial aneurysm identified. Posterior circulation: Intracranial vertebral arteries are patent without significant stenosis. Basilar artery is patent without significant stenosis. Bilateral posterior cerebral arteries patent without significant proximal stenosis. Venous sinuses: Within limitations of contrast timing, no convincing thrombus. Anatomic variants: None significant Review of the MIP images confirms the above findings These results were called by telephone at the time of interpretation on 06/14/2019 at 9:05 am to provider Dr. Rip Harbour, who verbally acknowledged these results. IMPRESSION: CT head: No CT evidence of acute intracranial abnormality. CTA head: No intracranial large vessel occlusion or evidence of high-grade proximal arterial stenosis. CTA neck: The common  carotid, internal carotid and vertebral arteries patent within the neck bilaterally without significant stenosis (50% or  greater). Electronically Signed   By: Jackey Loge   On: 06/14/2019 09:20   Ct Angio Neck W Or Wo Contrast  Result Date: 06/14/2019 CLINICAL DATA:  Stroke, follow-up. Additional history provided: Patient reports right-sided upper extremity numbness that began at approximately 04:00. EXAM: CT ANGIOGRAPHY HEAD AND NECK TECHNIQUE: Multidetector CT imaging of the head and neck was performed using the standard protocol during bolus administration of intravenous contrast. Multiplanar CT image reconstructions and MIPs were obtained to evaluate the vascular anatomy. Carotid stenosis measurements (when applicable) are obtained utilizing NASCET criteria, using the distal internal carotid diameter as the denominator. CONTRAST:  79mL OMNIPAQUE IOHEXOL 350 MG/ML SOLN COMPARISON:  Head CT 01/01/2015. FINDINGS: CT HEAD FINDINGS Brain: No evidence of acute intracranial hemorrhage. No demarcated cortical infarction. No evidence of intracranial mass. No midline shift or extra-axial fluid collection. Cerebral volume is normal for age. Partially empty and slightly expanded sella turcica. Vascular: Reported separately. Skull: Normal. Negative for fracture or focal lesion. Sinuses: Mild scattered paranasal sinus mucosal thickening. Right maxillary sinus mucous retention cyst. No significant mastoid effusion. Orbits: Visualized orbits demonstrate no acute abnormality. ASPECTS ([place] Stroke Program Early CT Score) - Ganglionic level infarction (caudate, lentiform nuclei, internal capsule, insula, M1-M3 cortex): 7 - Supraganglionic infarction (M4-M6 cortex): 3 Total score (0-10 with 10 being normal): 10 Review of the MIP images confirms the above findings These results were called by telephone at the time of interpretation on 06/14/2019 at 9:04 am to provider Dr. Juliette Alcide, who verbally acknowledged these results. CTA NECK FINDINGS Aortic arch: Standard branching. No dissection or aneurysm at the imaged levels. Right carotid system: CCA  and ICA patent within the neck without significant stenosis (50% or greater). Left carotid system: CCA and ICA patent within the neck without significant stenosis (50% or greater). Vertebral arteries: Bilateral vertebral arteries patent within the neck without significant stenosis (50% or greater). The vertebral arteries are codominant. Skeleton: No acute bony abnormality. Other neck: No soft tissue neck mass or pathologically enlarged cervical chain lymph nodes. The thyroid gland is unremarkable. Upper chest: No consolidation within the imaged lung apices. Review of the MIP images confirms the above findings CTA HEAD FINDINGS Anterior circulation: Bilateral intracranial carotid artery siphons patent without significant stenosis. Right middle and anterior cerebral arteries patent without evidence of significant proximal stenosis. Left middle and anterior cerebral arteries patent without evidence of significant proximal stenosis No intracranial aneurysm identified. Posterior circulation: Intracranial vertebral arteries are patent without significant stenosis. Basilar artery is patent without significant stenosis. Bilateral posterior cerebral arteries patent without significant proximal stenosis. Venous sinuses: Within limitations of contrast timing, no convincing thrombus. Anatomic variants: None significant Review of the MIP images confirms the above findings These results were called by telephone at the time of interpretation on 06/14/2019 at 9:05 am to provider Dr. Juliette Alcide, who verbally acknowledged these results. IMPRESSION: CT head: No CT evidence of acute intracranial abnormality. CTA head: No intracranial large vessel occlusion or evidence of high-grade proximal arterial stenosis. CTA neck: The common carotid, internal carotid and vertebral arteries patent within the neck bilaterally without significant stenosis (50% or greater). Electronically Signed   By: Jackey Loge   On: 06/14/2019 09:20   Mr Brain Wo  Contrast  Result Date: 06/14/2019 CLINICAL DATA:  TIA, initial exam. Additional history provided: Known history of diabetes, hypertension, presents to hospital with right hand and arm numbness and tingling.  EXAM: MRI HEAD WITHOUT CONTRAST TECHNIQUE: Multiplanar, multiecho pulse sequences of the brain and surrounding structures were obtained without intravenous contrast. COMPARISON:  CT head and CT angiogram head/neck 06/14/2019 FINDINGS: Brain: There is no convincing evidence of acute infarct. No midline shift or extra-axial fluid collection. No chronic intracranial blood products. Cerebral volume is normal for age. Incidentally noted, there is a 1.2 x 1.3 x 0.9 cm T1 hypointense, T2/FLAIR hyperintense sellar lesion with mild suprasellar extension (series 15, image 18). 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 Vascular: Flow voids maintained within the proximal large arterial vessels. Skull and upper cervical spine: Normal marrow signal. Sinuses/Orbits: Visualized orbits demonstrate no acute abnormality. Mild ethmoid sinus mucosal thickening. Mild polypoid mucosal thickening within the inferior right maxillary sinus. No significant mastoid effusion. IMPRESSION: 1. No evidence of acute intracranial abnormality, including acute infarct. 2. 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 3. Paranasal sinus disease as described. Electronically Signed   By: Jackey Loge   On: 06/14/2019 13:20   Dg Chest Portable 1 View  Result Date: 06/14/2019 CLINICAL DATA:  Right-sided weakness EXAM: PORTABLE CHEST 1 VIEW COMPARISON:  January 04, 2019 FINDINGS: No edema or consolidation. Heart size and pulmonary vascularity are normal. No adenopathy. No bone lesions. IMPRESSION: No edema or consolidation. Electronically Signed   By: Bretta Bang III M.D.   On: 06/14/2019 09:41    Medications:  I have reviewed the patient's current medications. Scheduled: . aspirin  300 mg Rectal Daily   Or  . aspirin  325 mg Oral Daily  . enoxaparin (LOVENOX) injection  40 mg Subcutaneous Q24H  . hydrochlorothiazide  25 mg Oral Daily  . insulin aspart  0-5 Units Subcutaneous QHS  . insulin aspart  0-9 Units Subcutaneous TID WC  . insulin aspart  4 Units Subcutaneous TID WC  . insulin glargine  30 Units Subcutaneous QHS  . lisinopril  40 mg Oral Daily    Assessment/Plan: 48 year old presenting with focal right sided neurological complaints.  Symptoms now resolved.  TIA in the differential.  Patient has been noncompliant with medications.  MRI of the brain reviewed and shows no acute changes.  CTA shows no evidence of hemodynamically significant stenosis.  Echocardiogram pending.  A1c pending, LDL 76.  Recommendations: 1. Compliance with medications stressed 2. Echocardiogram pending 3. Prophylactic therapy-Dual antiplatelet therapy with ASA  and Plavix  for three weeks with change to ASA  alone as monotherapy after that time. 4. Statin for lipid management with target LDL<70. 5. Follow up with neurology on an outpatient basis   LOS: 0 days   Thana Farr, MD Neurology [phone] 06/15/2019  11:14 AM

## 2019-06-15 NOTE — Discharge Instructions (Signed)
[facility] (Mental Health & [facility]stance Use Services) & [facility]  Mental health service in [place], [place] Address: [address], [place], [place] [address] Hours:  Closed ? Opens 8AM Mon Phone: [phone] 

## 2019-06-15 NOTE — Discharge Summary (Signed)
[facility] - [place] at [facility]   PATIENT NAME: Kelly [name]    MR#:  [id number]  DATE OF BIRTH:  April 04, 1971  DATE OF ADMISSION:  06/14/2019 ADMITTING PHYSICIAN: Houston SirenVivek J Sainani, MD  DATE OF DISCHARGE: 06/15/2019  PRIMARY CARE PHYSICIAN: [facility]    ADMISSION DIAGNOSIS:  Cerebrovascular accident (CVA), unspecified mechanism (HCC) [I63.9]  DISCHARGE DIAGNOSIS:  TIA  SECONDARY DIAGNOSIS:   Past Medical History:  Diagnosis Date  . Diabetes mellitus without complication (HCC)   . Hypertension     HOSPITAL COURSE:   1.  TIA.  MRI of the brain was negative for acute stroke.  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  CT Angio was negative.  Echocardiogram with bubble study did not show a hole in the heart.  Or blood clot in the heart.  Case discussed with neurology and we will treat this as a TIA they recommended aspirin and Plavix and Lipitor.  Dr. Thad Rangereynolds stated that after 3 weeks potentially can just go to aspirin alone 81 mg at that time. 2.  Depression with loss of a friend.  Patient requested to see psychiatry.  Psychiatry started Celexa 10 mg at bedtime for 1 week then increase to 20 mg.  Follow-up with [facility].  With starting Celexa and being hung on hydrochlorothiazide recommend a BMP in 1 week to check sodium. 3.  Type 2 diabetes mellitus on Levemir insulin and 4 units of short acting insulin prior to meals.  I advised the patient she must take her insulin.  Hemoglobin A1c still pending. 4.  Hypertension.  Decrease dose of hydrochlorothiazide.  Continue lisinopril.  I refilled both of these medications. 5.  Flu shot given prior to discharge  DISCHARGE CONDITIONS:   Satisfactory.  CONSULTS OBTAINED:  Treatment Team:  Kym Groomriadhosp, Neuro1, MD Charm RingsLord, Jamison Y, NP  DRUG ALLERGIES:  No Known Allergies  DISCHARGE MEDICATIONS:   Allergies as of 06/15/2019   No Known Allergies     Medication List    TAKE these medications   aspirin EC  81 MG tablet Take 1 tablet (81 mg total) by mouth daily.   atorvastatin 40 MG tablet Commonly known as: Lipitor Take 1 tablet (40 mg total) by mouth daily.   citalopram 10 MG tablet Commonly known as: CELEXA One tablet nightly for one week then two tablets nightly after that   clopidogrel 75 MG tablet Commonly known as: Plavix Take 1 tablet (75 mg total) by mouth daily.   hydrochlorothiazide 12.5 MG tablet Commonly known as: HYDRODIURIL Take 1 tablet (12.5 mg total) by mouth daily. What changed:   medication strength  how much to take   insulin aspart 100 UNIT/ML injection Commonly known as: novoLOG Inject 4 Units into the skin 3 (three) times daily with meals. Use only if eat 50% of meal   insulin glargine 100 UNIT/ML injection Commonly known as: LANTUS Inject 0.3 mLs (30 Units total) into the skin at bedtime.   lisinopril 40 MG tablet Commonly known as: ZESTRIL Take 1 tablet (40 mg total) by mouth daily.        DISCHARGE INSTRUCTIONS:   Follow-up PMD 5 days  If you experience worsening of your admission symptoms, develop shortness of breath, life threatening emergency, suicidal or homicidal thoughts you must seek medical attention immediately by calling 911 or calling your MD immediately  if symptoms less severe.  You Must read complete instructions/literature along with all the possible adverse reactions/side effects for all the Medicines  you take and that have been prescribed to you. Take any new Medicines after you have completely understood and accept all the possible adverse reactions/side effects.   Please note  You were cared for by a hospitalist during your hospital stay. If you have any questions about your discharge medications or the care you received while you were in the hospital after you are discharged, you can call the unit and asked to speak with the hospitalist on call if the hospitalist that took care of you is not available. Once you are  discharged, your primary care physician will handle any further medical issues. Please note that NO REFILLS for any discharge medications will be authorized once you are discharged, as it is imperative that you return to your primary care physician (or establish a relationship with a primary care physician if you do not have one) for your aftercare needs so that they can reassess your need for medications and monitor your lab values.    Today   CHIEF COMPLAINT:   Chief Complaint  Patient presents with  . Tingling    HISTORY OF PRESENT ILLNESS:  Kelly [name]  is a 48 y.o. female with a known history of came in with numbness and tingling right arm   VITAL SIGNS:  Blood pressure (!) 126/93, pulse 94, temperature 98.4 F (36.9 C), temperature source Oral, resp. rate 14, weight 79.6 kg, SpO2 100 %.    PHYSICAL EXAMINATION:  GENERAL:  48 y.o.-year-old patient lying in the bed with no acute distress. lying in the bed with no acute distress.  EYES: Pupils equal, round, reactive to light and accommodation. No scleral icterus. Extraocular muscles intact.  HEENT: Head atraumatic, normocephalic. Oropharynx and nasopharynx clear.  NECK:  Supple, no jugular venous distention. No thyroid enlargement, no tenderness.  LUNGS: Normal breath sounds bilaterally, no wheezing, rales,rhonchi or crepitation. No use of accessory muscles of respiration.  CARDIOVASCULAR: S1, S2 normal. No murmurs, rubs, or gallops.  ABDOMEN: Soft, non-tender, non-distended. Bowel sounds present. No organomegaly or mass.  EXTREMITIES: No pedal edema, cyanosis, or clubbing.  NEUROLOGIC: Cranial nerves II through XII are intact. Muscle strength 5/5 in all extremities. Sensation intact. Gait not checked.  PSYCHIATRIC: The patient is alert and oriented x 3.  SKIN: No obvious rash, lesion, or ulcer.   DATA REVIEW:   CBC Recent Labs  Lab 06/14/19 0823  WBC 5.9  HGB 13.0  HCT 38.8  PLT 242    Chemistries  Recent Labs  Lab 06/14/19 0823  NA 137  K 3.7  CL  98  CO2 26  GLUCOSE 280*  BUN 20  CREATININE 0.96  CALCIUM 9.7  AST 25  ALT 36  ALKPHOS 73  BILITOT 0.7     Microbiology Results  Results for orders placed or performed during the hospital encounter of 06/14/19  SARS CORONAVIRUS 2 (TAT 6-24 HRS) Nasopharyngeal Nasopharyngeal Swab     Status: None   Collection Time: 06/14/19  9:28 AM   Specimen: Nasopharyngeal Swab  Result Value Ref Range Status   SARS Coronavirus 2 NEGATIVE NEGATIVE Final    Comment: (NOTE) SARS-CoV-2 target nucleic acids are NOT DETECTED. The SARS-CoV-2 RNA is generally detectable in upper and lower respiratory specimens during the acute phase of infection. Negative results do not preclude SARS-CoV-2 infection, do not rule out co-infections with other pathogens, and should not be used as the sole basis for treatment or other patient management decisions. Negative results must be combined with clinical observations, patient history, and epidemiological information. The expected result is Negative.  Fact Sheet for Patients: HairSlick.no Fact Sheet for Healthcare Providers: quierodirigir.com This test is not yet approved or cleared by the [place] FDA and  has been authorized for detection and/or diagnosis of SARS-CoV-2 by FDA under an Emergency Use Authorization (EUA). This EUA will remain  in effect (meaning this test can be used) for the duration of the COVID-19 declaration under Section 56 4(b)(1) of the Act, 21 U.S.C. section 360bbb-3(b)(1), unless the authorization is terminated or revoked sooner. Performed at [facility], [address], [place], [place] [address]     RADIOLOGY:  Ct Angio Head W Or Wo Contrast  Result Date: 06/14/2019 CLINICAL DATA:  Stroke, follow-up. Additional history provided: Patient reports right-sided upper extremity numbness that began at approximately 04:00. EXAM: CT ANGIOGRAPHY HEAD AND NECK TECHNIQUE:  Multidetector CT imaging of the head and neck was performed using the standard protocol during bolus administration of intravenous contrast. Multiplanar CT image reconstructions and MIPs were obtained to evaluate the vascular anatomy. Carotid stenosis measurements (when applicable) are obtained utilizing NASCET criteria, using the distal internal carotid diameter as the denominator. CONTRAST:  75mL OMNIPAQUE IOHEXOL 350 MG/ML SOLN COMPARISON:  Head CT 01/01/2015. FINDINGS: CT HEAD FINDINGS Brain: No evidence of acute intracranial hemorrhage. No demarcated cortical infarction. No evidence of intracranial mass. No midline shift or extra-axial fluid collection. Cerebral volume is normal for age. Partially empty and slightly expanded sella turcica. Vascular: Reported separately. Skull: Normal. Negative for fracture or focal lesion. Sinuses: Mild scattered paranasal sinus mucosal thickening. Right maxillary sinus mucous retention cyst. No significant mastoid effusion. Orbits: Visualized orbits demonstrate no acute abnormality. ASPECTS ([place] Stroke Program Early CT Score) - Ganglionic level infarction (caudate, lentiform nuclei, internal capsule, insula, M1-M3 cortex): 7 - Supraganglionic infarction (M4-M6 cortex): 3 Total score (0-10 with 10 being normal): 10 Review of the MIP images confirms the above findings These results were called by telephone at the time of interpretation on 06/14/2019 at 9:04 am to provider Dr. Juliette Alcide, who verbally acknowledged these results. CTA NECK FINDINGS Aortic arch: Standard branching. No dissection or aneurysm at the imaged levels. Right carotid system: CCA and ICA patent within the neck without significant stenosis (50% or greater). Left carotid system: CCA and ICA patent within the neck without significant stenosis (50% or greater). Vertebral arteries: Bilateral vertebral arteries patent within the neck without significant stenosis (50% or greater). The vertebral arteries are  codominant. Skeleton: No acute bony abnormality. Other neck: No soft tissue neck mass or pathologically enlarged cervical chain lymph nodes. The thyroid gland is unremarkable. Upper chest: No consolidation within the imaged lung apices. Review of the MIP images confirms the above findings CTA HEAD FINDINGS Anterior circulation: Bilateral intracranial carotid artery siphons patent without significant stenosis. Right middle and anterior cerebral arteries patent without evidence of significant proximal stenosis. Left middle and anterior cerebral arteries patent without evidence of significant proximal stenosis No intracranial aneurysm identified. Posterior circulation: Intracranial vertebral arteries are patent without significant stenosis. Basilar artery is patent without significant stenosis. Bilateral posterior cerebral arteries patent without significant proximal stenosis. Venous sinuses: Within limitations of contrast timing, no convincing thrombus. Anatomic variants: None significant Review of the MIP images confirms the above findings These results were called by telephone at the time of interpretation on 06/14/2019 at 9:05 am to provider Dr. Juliette Alcide, who verbally acknowledged these results. IMPRESSION: CT head: No CT evidence of acute intracranial abnormality. CTA head: No intracranial large vessel occlusion or evidence of high-grade proximal arterial stenosis. CTA  neck: The common carotid, internal carotid and vertebral arteries patent within the neck bilaterally without significant stenosis (50% or greater). Electronically Signed   By: Kellie Simmering   On: 06/14/2019 09:20   Ct Angio Neck W Or Wo Contrast  Result Date: 06/14/2019 CLINICAL DATA:  Stroke, follow-up. Additional history provided: Patient reports right-sided upper extremity numbness that began at approximately 04:00. EXAM: CT ANGIOGRAPHY HEAD AND NECK TECHNIQUE: Multidetector CT imaging of the head and neck was performed using the standard  protocol during bolus administration of intravenous contrast. Multiplanar CT image reconstructions and MIPs were obtained to evaluate the vascular anatomy. Carotid stenosis measurements (when applicable) are obtained utilizing NASCET criteria, using the distal internal carotid diameter as the denominator. CONTRAST:  41mL OMNIPAQUE IOHEXOL 350 MG/ML SOLN COMPARISON:  Head CT 01/01/2015. FINDINGS: CT HEAD FINDINGS Brain: No evidence of acute intracranial hemorrhage. No demarcated cortical infarction. No evidence of intracranial mass. No midline shift or extra-axial fluid collection. Cerebral volume is normal for age. Partially empty and slightly expanded sella turcica. Vascular: Reported separately. Skull: Normal. Negative for fracture or focal lesion. Sinuses: Mild scattered paranasal sinus mucosal thickening. Right maxillary sinus mucous retention cyst. No significant mastoid effusion. Orbits: Visualized orbits demonstrate no acute abnormality. ASPECTS ([place] Stroke Program Early CT Score) - Ganglionic level infarction (caudate, lentiform nuclei, internal capsule, insula, M1-M3 cortex): 7 - Supraganglionic infarction (M4-M6 cortex): 3 Total score (0-10 with 10 being normal): 10 Review of the MIP images confirms the above findings These results were called by telephone at the time of interpretation on 06/14/2019 at 9:04 am to provider Dr. Rip Harbour, who verbally acknowledged these results. CTA NECK FINDINGS Aortic arch: Standard branching. No dissection or aneurysm at the imaged levels. Right carotid system: CCA and ICA patent within the neck without significant stenosis (50% or greater). Left carotid system: CCA and ICA patent within the neck without significant stenosis (50% or greater). Vertebral arteries: Bilateral vertebral arteries patent within the neck without significant stenosis (50% or greater). The vertebral arteries are codominant. Skeleton: No acute bony abnormality. Other neck: No soft tissue neck mass  or pathologically enlarged cervical chain lymph nodes. The thyroid gland is unremarkable. Upper chest: No consolidation within the imaged lung apices. Review of the MIP images confirms the above findings CTA HEAD FINDINGS Anterior circulation: Bilateral intracranial carotid artery siphons patent without significant stenosis. Right middle and anterior cerebral arteries patent without evidence of significant proximal stenosis. Left middle and anterior cerebral arteries patent without evidence of significant proximal stenosis No intracranial aneurysm identified. Posterior circulation: Intracranial vertebral arteries are patent without significant stenosis. Basilar artery is patent without significant stenosis. Bilateral posterior cerebral arteries patent without significant proximal stenosis. Venous sinuses: Within limitations of contrast timing, no convincing thrombus. Anatomic variants: None significant Review of the MIP images confirms the above findings These results were called by telephone at the time of interpretation on 06/14/2019 at 9:05 am to provider Dr. Rip Harbour, who verbally acknowledged these results. IMPRESSION: CT head: No CT evidence of acute intracranial abnormality. CTA head: No intracranial large vessel occlusion or evidence of high-grade proximal arterial stenosis. CTA neck: The common carotid, internal carotid and vertebral arteries patent within the neck bilaterally without significant stenosis (50% or greater). Electronically Signed   By: Kellie Simmering   On: 06/14/2019 09:20   Mr Brain Wo Contrast  Result Date: 06/14/2019 CLINICAL DATA:  TIA, initial exam. Additional history provided: Known history of diabetes, hypertension, presents to hospital with right hand and arm numbness  and tingling. EXAM: MRI HEAD WITHOUT CONTRAST TECHNIQUE: Multiplanar, multiecho pulse sequences of the brain and surrounding structures were obtained without intravenous contrast. COMPARISON:  CT head and CT angiogram  head/neck 06/14/2019 FINDINGS: Brain: There is no convincing evidence of acute infarct. No midline shift or extra-axial fluid collection. No chronic intracranial blood products. Cerebral volume is normal for age. Incidentally noted, there is a 1.2 x 1.3 x 0.9 cm T1 hypointense, T2/FLAIR hyperintense sellar lesion with mild suprasellar extension (series 15, image 18). 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 Vascular: Flow voids maintained within the proximal large arterial vessels. Skull and upper cervical spine: Normal marrow signal. Sinuses/Orbits: Visualized orbits demonstrate no acute abnormality. Mild ethmoid sinus mucosal thickening. Mild polypoid mucosal thickening within the inferior right maxillary sinus. No significant mastoid effusion. IMPRESSION: 1. No evidence of acute intracranial abnormality, including acute infarct. 2. 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 3. Paranasal sinus disease as described. Electronically Signed   By: Jackey Loge   On: 06/14/2019 13:20   Dg Chest Portable 1 View  Result Date: 06/14/2019 CLINICAL DATA:  Right-sided weakness EXAM: PORTABLE CHEST 1 VIEW COMPARISON:  January 04, 2019 FINDINGS: No edema or consolidation. Heart size and pulmonary vascularity are normal. No adenopathy. No bone lesions. IMPRESSION: No edema or consolidation. Electronically Signed   By: Bretta Bang III M.D.   On: 06/14/2019 09:41     Management plans discussed with the patient, and she is in agreement.  Patient deferred me calling family at this time.  CODE STATUS:     Code Status Orders  (From admission, onward)         Start     Ordered   06/14/19 1436  Full code  Continuous     06/14/19 1435        Code Status History    Date Active Date Inactive Code Status Order ID Comments  User Context   07/11/2017 2210 07/18/2017 2158 Full Code [id number]  Clapacs, Jackquline Denmark, MD Inpatient   Advance Care Planning Activity      TOTAL TIME TAKING CARE OF THIS PATIENT: 35 minutes.    Alford Highland M.D on 06/15/2019 at 12:40 PM  Between 7am to 6pm - Pager - [phone]  After 6pm go to [email or web address] - Social research officer, government  [employer] [facility] Office  [phone]  CC: Primary care physician; [facility]

## 2019-06-15 NOTE — Progress Notes (Signed)
PT Cancellation Note  Patient Details Name: Silvina Hackleman MRN: [id number] DOB: 08/29/1971   Cancelled Treatment:    Reason Eval/Treat Not Completed: PT screened, no needs identified, will sign off.  PT consult received.  Chart reviewed.  Upon PT arriving to pt's room, pt resting in bed.  Pt reports ambulating in room and toileting without any issues (and pt reports no balance issues); pt reports no need for PT evaluation (pt reports being back to baseline functional independence).  Will sign off.  Please re-consult PT if any additional PT needs arise during hospitalization.  Leitha Bleak, PT 06/15/19, 10:40 AM [phone]

## 2019-06-15 NOTE — Evaluation (Signed)
SLP Cancellation Note  Patient Details Name: Kelly [name] MRN: [id number] DOB: [birth year]   Cancelled treatment:       Reason Eval/Treat Not Completed: SLP screened, no needs identified, will sign off  Pt or nsg will contact slp if changes occur   [place] 06/15/2019, 10:08 AM

## 2019-06-15 NOTE — Progress Notes (Signed)
Patient ID: Kelly [name]  The patient was admitted to [facility] on 06/14/2019 and discharged 06/15/2019.  Please excuse from work through 06/18/2019.  Dr Loletha Grayer [phone]

## 2019-06-15 NOTE — Progress Notes (Signed)
Patient discharged to home. Tele and IV d/c'd. Verbalizes understanding of discharge instructions.  Patient received flu vaccine prior to discharge.

## 2019-06-17 LAB — HEMOGLOBIN A1C
Hgb A1c MFr Bld: 12.2 % — ABNORMAL HIGH (ref 4.8–5.6)
Mean Plasma Glucose: 303 mg/dL

## 2019-07-26 ENCOUNTER — Other Ambulatory Visit: Payer: Self-pay

## 2019-07-26 ENCOUNTER — Encounter: Payer: Self-pay | Admitting: Family Medicine

## 2019-07-26 ENCOUNTER — Ambulatory Visit: Payer: Medicaid Other | Admitting: Family Medicine

## 2019-07-26 VITALS — BP 120/80 | HR 81 | Temp 97.9°F | Resp 16 | Ht 64.0 in | Wt 180.8 lb

## 2019-07-26 DIAGNOSIS — E119 Type 2 diabetes mellitus without complications: Secondary | ICD-10-CM

## 2019-07-26 DIAGNOSIS — F419 Anxiety disorder, unspecified: Secondary | ICD-10-CM | POA: Diagnosis not present

## 2019-07-26 DIAGNOSIS — F331 Major depressive disorder, recurrent, moderate: Secondary | ICD-10-CM

## 2019-07-26 DIAGNOSIS — Z8673 Personal history of transient ischemic attack (TIA), and cerebral infarction without residual deficits: Secondary | ICD-10-CM

## 2019-07-26 DIAGNOSIS — E1169 Type 2 diabetes mellitus with other specified complication: Secondary | ICD-10-CM

## 2019-07-26 DIAGNOSIS — K120 Recurrent oral aphthae: Secondary | ICD-10-CM

## 2019-07-26 DIAGNOSIS — Z1159 Encounter for screening for other viral diseases: Secondary | ICD-10-CM

## 2019-07-26 DIAGNOSIS — E785 Hyperlipidemia, unspecified: Secondary | ICD-10-CM

## 2019-07-26 DIAGNOSIS — I10 Essential (primary) hypertension: Secondary | ICD-10-CM

## 2019-07-26 MED ORDER — INSULIN DETEMIR 100 UNIT/ML ~~LOC~~ SOLN: 24.0000 [IU] | Freq: Every day | SUBCUTANEOUS | 3 refills | Status: DC

## 2019-07-26 MED ORDER — CITALOPRAM HYDROBROMIDE 10 MG PO TABS: ORAL_TABLET | ORAL | 0 refills | Status: DC

## 2019-07-26 MED ORDER — OZEMPIC (0.25 OR 0.5 MG/DOSE) 2 MG/1.5ML ~~LOC~~ SOPN: PEN_INJECTOR | SUBCUTANEOUS | 3 refills | Status: DC

## 2019-07-26 MED ORDER — HYDROXYZINE HCL 10 MG PO TABS: 10.0000 mg | ORAL_TABLET | Freq: Three times a day (TID) | ORAL | 0 refills | Status: DC | PRN

## 2019-07-26 MED ORDER — FREESTYLE LIBRE 14 DAY SENSOR MISC: 1.0000 | 11 refills | Status: DC

## 2019-07-26 MED ORDER — LIDOCAINE VISCOUS HCL 2 % MT SOLN: 15.0000 mL | OROMUCOSAL | 0 refills | Status: DC | PRN

## 2019-07-26 MED ORDER — NOVOFINE 32G X 6 MM MISC: 1.0000 | Freq: Four times a day (QID) | 4 refills | Status: DC

## 2019-07-26 MED ORDER — FREESTYLE LIBRE 14 DAY READER DEVI: 1.0000 | Freq: Once | 0 refills | Status: AC

## 2019-07-26 NOTE — Patient Instructions (Signed)
Increase your Levemir to 24 units. Take Ozempic injection once a week - .25mg  dosing for 4 weeks, the increase to .5mg  dosing. We will have a pharmacist Almyra Free) call you to help titrate your insulin, and a nurse name Solmon Ice to talk to you about your DM.

## 2019-07-26 NOTE — Progress Notes (Signed)
Name: Kelly [name]   MRN: [id number]    DOB: 1971/09/13   Date:07/26/2019       Progress Note  Subjective  Chief Complaint  Chief Complaint  Patient presents with   Establish Care   Transient Ischemic Attack    on 9/25   Diabetes    HPI  1.  TIA.  MRI of the brain was negative for acute stroke, though she still has some intermittent right hand numbness.  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  CT Angio was negative.  Echocardiogram with bubble study did not show abnormality.   Case discussed with neurology while she was admitted and was treated this as a TIA they recommended aspirin and Plavix and Lipitor.  Dr. Thad Ranger' note states that after 3 weeks may go to aspirin alone 81 mg at that time.  She has completed the 3 week course and does not have neurology outpatient follow up.  2.  Depression with loss of a close friend.  Patient requested to see psychiatry while admitted, they started Celexa, but she never took it once she went home.  Was recommended to follow up with [facility].  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  With starting Celexa and being on hydrochlorothiazide recommended a BMP in 1 week to check sodium, though she did not have this done then, we will perform today.  She was taking alprazolam at one point, states ran out over a year ago, and since then she tends to drink alcohol to cope with her anxiety.  She does not drink alcohol, but will have 2 glasses of wine or so when she is stressed.   3.  Type 2 diabetes mellitus, diagnosed 2 years ago. on Levemir 20units nightly, and 4 units of short acting insulin prior to meals TID.  She avoids sweets, sodas, and white carbohydrates.  Trying to eat high protein.  However her BG's are still running int he 300's consistently, with occasional dip into the 100's where she feels hypoglycemic.  Denies family or personal history of thyroid cancer or pancreatitis.  Has  never seen endocrinology, was previously managed by [facility].  4.  Hypertension.  Decrease dose of hydrochlorothiazide; continued lisinopril.  Taking medications as prescribed, no chest pain, shortness of breath, BLE edema. CMP reviewed from hospital and was WNL.  Will recheck today.  5.  Aphthous Ulcer - she noticed it started yesterday on the left bottom lip, it is painful, no bleeding.   Patient Active Problem List   Diagnosis Date Noted   Major depressive disorder, recurrent episode, moderate (HCC) 06/15/2019   CVA (cerebral vascular accident) (HCC) 06/14/2019   Diabetes mellitus without complication (HCC) 07/11/2017   Hypertension 07/11/2017   Anxiety 01/19/2012    Past Surgical History:  Procedure Laterality Date   ABDOMINAL HYSTERECTOMY     GALLBLADDER SURGERY      Family History  Problem Relation Age of Onset   Breast cancer Mother     Social History   Socioeconomic History   Marital status: Single    Spouse name: Not on file   Number of children: 1   Years of education: Not on file   Highest education level: Not on file  Occupational History   Occupation: Public affairs consultant    Comment: Education officer, environmental  Social Needs   Financial resource strain: Not on file   Food insecurity    Worry: Not on file    Inability: Not on file   Transportation  needs    Medical: Not on file    Non-medical: Not on file  Tobacco Use   Smoking status: Never Smoker   Smokeless tobacco: Never Used  Substance and Sexual Activity   Alcohol use: Yes    Comment: Socially    Drug use: No   Sexual activity: Yes    Partners: Male    Birth control/protection: Surgical  Lifestyle   Physical activity    Days per week: 5 days    Minutes per session: 60 min   Stress: Rather much  Relationships   Social connections    Talks on phone: More than three times a week    Gets together: Never    Attends religious service: Never    Active member of club or organization: No    Attends meetings of clubs or organizations: Never     Relationship status: Never married   Intimate partner violence    Fear of current or ex partner: No    Emotionally abused: No    Physically abused: No    Forced sexual activity: No  Other Topics Concern   Not on file  Social History Narrative   Daughter lives with patient     Current Outpatient Medications:    aspirin 81 MG chewable tablet, Chew by mouth daily., Disp: , Rfl:    atorvastatin (LIPITOR) 40 MG tablet, Take 1 tablet (40 mg total) by mouth daily., Disp: 30 tablet, Rfl: 0   hydrochlorothiazide (HYDRODIURIL) 12.5 MG tablet, Take 1 tablet (12.5 mg total) by mouth daily., Disp: 30 tablet, Rfl: 0   insulin aspart (NOVOLOG) 100 UNIT/ML injection, Inject 4 Units into the skin 3 (three) times daily with meals. Use only if eat 50% of meal, Disp: 3.6 mL, Rfl: 0   insulin detemir (LEVEMIR) 100 UNIT/ML injection, Inject 20 Units into the skin at bedtime., Disp: , Rfl:    lisinopril (ZESTRIL) 40 MG tablet, Take 1 tablet (40 mg total) by mouth daily., Disp: 30 tablet, Rfl: 0   citalopram (CELEXA) 10 MG tablet, One tablet nightly for one week then two tablets nightly after that (Patient not taking: Reported on 07/26/2019), Disp: 53 tablet, Rfl: 0   insulin glargine (LANTUS) 100 UNIT/ML injection, Inject 0.3 mLs (30 Units total) into the skin at bedtime., Disp: 9 mL, Rfl: 0  No Known Allergies  I personally reviewed active problem list, medication list, allergies, health maintenance, notes from last encounter, lab results with the patient/caregiver today.   ROS Ten systems reviewed and is negative except as mentioned in HPI  Objective  Vitals:   07/26/19 1256  BP: 120/80  Pulse: 81  Resp: 16  Temp: 97.9 F (36.6 C)  TempSrc: Temporal  SpO2: 94%  Weight: 180 lb 12.8 oz (82 kg)  Height: 5\' 4"  (1.626 m)   Body mass index is 31.03 kg/m.  Physical Exam  Constitutional: Patient appears well-developed and well-nourished. No distress.  HENT: Head: Normocephalic and  atraumatic.  Eyes: Conjunctivae and EOM are normal. No scleral icterus.   Neck: Normal range of motion. Neck supple. No JVD present. No thyromegaly present.  Cardiovascular: Normal rate, regular rhythm and normal heart sounds.  No murmur heard. No BLE edema. Pulmonary/Chest: Effort normal and breath sounds normal. No respiratory distress. Musculoskeletal: Normal range of motion, no joint effusions. No gross deformities Neurological: Pt is alert and oriented to person, place, and time. No cranial nerve deficit. Coordination, balance, strength, speech and gait are normal.  Skin: Skin is warm and  dry. No rash noted. No erythema.  Psychiatric: Patient has a normal mood and affect. behavior is normal. Judgment and thought content normal.  No results found for this or any previous visit (from the past 72 hour(s)).  PHQ2/9: Depression screen PHQ 2/9 07/26/2019  Decreased Interest 1  Down, Depressed, Hopeless 1  PHQ - 2 Score 2  Altered sleeping 2  Tired, decreased energy 1  Change in appetite 1  Feeling bad or failure about yourself  0  Trouble concentrating 1  Moving slowly or fidgety/restless 1  Suicidal thoughts 0  PHQ-9 Score 8  Difficult doing work/chores Somewhat difficult   PHQ-2/9 Result is positive.    Fall Risk: Fall Risk  07/26/2019  Falls in the past year? 0  Number falls in past yr: 0  Injury with Fall? 0  Follow up Falls evaluation completed    Assessment & Plan  1. History of transient ischemic attack (TIA) - Stable since discharge, no new symptoms.  - Ambulatory referral to Neurology - Ambulatory referral to Chronic Care Management Services - Lipid panel  2. Diabetes mellitus without complication ([place]) - Will increase to 24 units today; then will work on titrating slowly with PharmD - Continuous Blood Gluc Receiver (FREESTYLE LIBRE 14 DAY READER) DEVI; 1 each by Does not apply route once for 1 dose.  Dispense: 1 each; Refill: 0 - Continuous Blood Gluc Sensor  (FREESTYLE LIBRE 14 DAY SENSOR) MISC; 1 each by Does not apply route every 14 (fourteen) days.  Dispense: 1 each; Refill: 11 - Semaglutide,0.25 or 0.5MG /DOS, (OZEMPIC, 0.25 OR 0.5 MG/DOSE,) 2 MG/1.5ML SOPN; Inject 0.25 mg into the skin once a week for 28 days, THEN 0.5 mg once a week.  Dispense: 3 pen; Refill: 3 - insulin detemir (LEVEMIR) 100 UNIT/ML injection; Inject 0.24-0.5 mLs (24-50 Units total) into the skin at bedtime.  Dispense: 10 mL; Refill: 3 - Ambulatory referral to Chronic Care Management Services - COMPLETE METABOLIC PANEL WITH GFR  3. Major depressive disorder, recurrent episode, moderate ([place]) - Start on low dose Celexa and PRN hydroxyzine, avoid ETOH use. - citalopram (CELEXA) 10 MG tablet; One tablet nightly.  Dispense: 90 tablet; Refill: 0 - hydrOXYzine (ATARAX/VISTARIL) 10 MG tablet; Take 1 tablet (10 mg total) by mouth every 8 (eight) hours as needed for anxiety.  Dispense: 30 tablet; Refill: 0 - Ambulatory referral to Chronic Care Management Services  4. Anxiety - citalopram (CELEXA) 10 MG tablet; One tablet nightly.  Dispense: 90 tablet; Refill: 0 - hydrOXYzine (ATARAX/VISTARIL) 10 MG tablet; Take 1 tablet (10 mg total) by mouth every 8 (eight) hours as needed for anxiety.  Dispense: 30 tablet; Refill: 0  5. Essential hypertension - Continue current meds, CMP per orders; DASH diet - Ambulatory referral to Chronic Care Management Services - COMPLETE METABOLIC PANEL WITH GFR  6. Hyperlipidemia due to type 2 diabetes mellitus ([place]) - Continue statin therapy. - Lipid panel  7. Aphthous ulcer of mouth - lidocaine (XYLOCAINE) 2 % solution; Use as directed 15 mLs in the mouth or throat every 3 (three) hours as needed for mouth pain.  Dispense: 100 mL; Refill: 0  8. Need for hepatitis C screening test - Hepatitis C antibody

## 2019-07-29 ENCOUNTER — Other Ambulatory Visit: Payer: Self-pay | Admitting: Family Medicine

## 2019-07-29 ENCOUNTER — Telehealth: Payer: Self-pay | Admitting: Family Medicine

## 2019-07-29 DIAGNOSIS — R7301 Impaired fasting glucose: Secondary | ICD-10-CM

## 2019-07-29 NOTE — Chronic Care Management (AMB) (Signed)
  Care Management   Note  07/29/2019 Name: Zyara Riling MRN: [id number] DOB: [birth year]  Irva Loser is a 48 y.o. year old female who is a primary care patient of Hubbard Hartshorn, FNP. I reached out to [place] by phone today in response to a referral sent by Ms. Glenetta Borg Dobransky's health plan.    Ms. Kohn was given information about care management services today including:  1. Care management services include personalized support from designated clinical staff supervised by her physician, including individualized plan of care and coordination with other care providers 2. 24/7 contact phone numbers for assistance for urgent and routine care needs. 3. The patient may stop care management services at any time by phone call to the office staff.  Patient agreed to services and verbal consent obtained.   Follow up plan: Telephone appointment with CCM team member scheduled for:07/30/2019  [place] . [place]  ??[email or web address] ??[phone]

## 2019-07-30 ENCOUNTER — Ambulatory Visit: Payer: Medicaid Other | Admitting: Pharmacist

## 2019-07-30 DIAGNOSIS — R7301 Impaired fasting glucose: Secondary | ICD-10-CM

## 2019-07-30 DIAGNOSIS — E119 Type 2 diabetes mellitus without complications: Secondary | ICD-10-CM

## 2019-08-01 ENCOUNTER — Other Ambulatory Visit: Payer: Self-pay | Admitting: Family Medicine

## 2019-08-01 DIAGNOSIS — E119 Type 2 diabetes mellitus without complications: Secondary | ICD-10-CM

## 2019-08-01 MED ORDER — INSULIN DETEMIR 100 UNIT/ML FLEXPEN: 24.0000 [IU] | PEN_INJECTOR | Freq: Every day | SUBCUTANEOUS | 3 refills | Status: DC

## 2019-08-02 ENCOUNTER — Other Ambulatory Visit: Payer: Self-pay | Admitting: Family Medicine

## 2019-08-02 DIAGNOSIS — B1920 Unspecified viral hepatitis C without hepatic coma: Secondary | ICD-10-CM

## 2019-08-02 LAB — LIPID PANEL
Cholesterol: 137 mg/dL (ref <200)
HDL: 41 mg/dL — ABNORMAL LOW (ref >=50)
LDL Cholesterol (Calc): 70 mg/dL (calc)
Non-HDL Cholesterol (Calc): 96 mg/dL (calc) (ref <130)
Total CHOL/HDL Ratio: 3.3 (calc) (ref <5.0)
Triglycerides: 187 mg/dL — ABNORMAL HIGH (ref <150)

## 2019-08-02 LAB — HEPATITIS C ANTIBODY
Hepatitis C Ab: REACTIVE — AB
SIGNAL TO CUT-OFF: 24.3 — ABNORMAL HIGH (ref <1.00)

## 2019-08-02 LAB — COMPLETE METABOLIC PANEL WITH GFR
AG Ratio: 1.2 (calc) (ref 1.0–2.5)
ALT: 35 U/L — ABNORMAL HIGH (ref 6–29)
AST: 22 U/L (ref 10–35)
Albumin: 4.2 g/dL (ref 3.6–5.1)
Alkaline phosphatase (APISO): 65 U/L (ref 31–125)
BUN: 16 mg/dL (ref 7–25)
CO2: 29 mmol/L (ref 20–32)
Calcium: 10.1 mg/dL (ref 8.6–10.2)
Chloride: 102 mmol/L (ref 98–110)
Creat: 0.9 mg/dL (ref 0.50–1.10)
GFR, Est African American: 88 mL/min/{1.73_m2} (ref >=60)
GFR, Est Non African American: 76 mL/min/{1.73_m2} (ref >=60)
Globulin: 3.5 g/dL (calc) (ref 1.9–3.7)
Glucose, Bld: 257 mg/dL — ABNORMAL HIGH (ref 65–99)
Potassium: 4 mmol/L (ref 3.5–5.3)
Sodium: 140 mmol/L (ref 135–146)
Total Bilirubin: 0.3 mg/dL (ref 0.2–1.2)
Total Protein: 7.7 g/dL (ref 6.1–8.1)

## 2019-08-02 LAB — HCV RNA,QUANTITATIVE REAL TIME PCR
HCV Quantitative Log: 6.31 Log IU/mL — ABNORMAL HIGH
HCV RNA, PCR, QN: 2040000 IU/mL — ABNORMAL HIGH

## 2019-08-05 ENCOUNTER — Encounter: Payer: Self-pay | Admitting: *Deleted

## 2019-08-08 ENCOUNTER — Other Ambulatory Visit: Payer: Self-pay | Admitting: Family Medicine

## 2019-08-08 DIAGNOSIS — E119 Type 2 diabetes mellitus without complications: Secondary | ICD-10-CM

## 2019-08-08 MED ORDER — LIRAGLUTIDE 18 MG/3ML ~~LOC~~ SOPN: PEN_INJECTOR | SUBCUTANEOUS | 3 refills | Status: DC

## 2019-08-08 NOTE — Progress Notes (Signed)
Medicaid will not cover Ozempic; Victoza is sent in.

## 2019-08-12 ENCOUNTER — Telehealth: Payer: Self-pay | Admitting: Family Medicine

## 2019-08-12 NOTE — Telephone Encounter (Signed)
Kelly [name] with [facility] Neurology called and stated that they have refill some of the patients medications because the patient seemed confused about them. [name] states that are in care everywhere. Kelly [name] also states that pt A1c was high and recommends that pt see endo. They are unable to place this referral because pt has medicaid. Please advise

## 2019-08-13 NOTE — Telephone Encounter (Signed)
Please place referral

## 2019-08-14 NOTE — Telephone Encounter (Signed)
Spoke directly with [facility] from [facility] - we will allow the patient 1 more month on medication management from our clinic, recheck A1C and refer at that time.  She also noted that patient called their office confused about her medications.   **PLEASE CALL PATIENT TO VERIFY MEDS** - please call to go over medications, correct doses, and ensure she knows when/how to take these medications. If she needs an appointment to review, please schedule ASAP.

## 2019-08-16 NOTE — Telephone Encounter (Signed)
Called patient , left message for her to call back.

## 2019-08-19 NOTE — Telephone Encounter (Signed)
Pt returned Bonnita Nasuti call, please call her at [phone]

## 2019-08-20 NOTE — Telephone Encounter (Signed)
Spoke to patient. She is to call back with her medication list

## 2019-08-21 ENCOUNTER — Other Ambulatory Visit: Payer: Self-pay

## 2019-08-21 ENCOUNTER — Ambulatory Visit: Payer: Medicaid Other

## 2019-08-21 DIAGNOSIS — I10 Essential (primary) hypertension: Secondary | ICD-10-CM

## 2019-08-21 DIAGNOSIS — E119 Type 2 diabetes mellitus without complications: Secondary | ICD-10-CM

## 2019-08-21 NOTE — Chronic Care Management (AMB) (Signed)
Care Management   Initial Visit Note  08/21/2019 Name: Kelly [name] MRN: [id number] DOB: 1971/04/22   Assessment: Kelly [name] is a 48 y.o. year old female who sees Doren Custard, FNP for primary care. The care management team was consulted for assistance with care management and care coordination.  Review of Kelly [name]'s status, including review of consultants reports, relevant labs and test results was conducted today. Collaboration with appropriate care team members was performed as part of the comprehensive evaluation and provision of chronic care management services.    SDOH (Social Determinants of Health) screening performed today.   Outpatient Encounter Medications as of 08/21/2019  Medication Sig  . aspirin 81 MG chewable tablet Chew by mouth daily.  . Continuous Blood Gluc Sensor (FREESTYLE LIBRE 14 DAY SENSOR) MISC 1 each by Does not apply route every 14 (fourteen) days.  . hydrochlorothiazide (HYDRODIURIL) 12.5 MG tablet Take 1 tablet (12.5 mg total) by mouth daily.  . hydrOXYzine (ATARAX/VISTARIL) 10 MG tablet Take 1 tablet (10 mg total) by mouth every 8 (eight) hours as needed for anxiety.  . Insulin Detemir (LEVEMIR) 100 UNIT/ML Pen Inject 24-50 Units into the skin daily.  [name] atorvastatin (LIPITOR) 40 MG tablet Take 1 tablet (40 mg total) by mouth daily. (Patient not taking: Reported on 07/30/2019)  . citalopram (CELEXA) 10 MG tablet One tablet nightly. (Patient not taking: Reported on 07/30/2019)  . insulin aspart (NOVOLOG) 100 UNIT/ML injection Inject 4 Units into the skin 3 (three) times daily with meals. Use only if eat 50% of meal  . Insulin Pen Needle (NOVOFINE) 32G X 6 MM MISC 1 each by Does not apply route 4 (four) times daily. (Patient not taking: Reported on 07/30/2019)  . lidocaine (XYLOCAINE) 2 % solution Use as directed 15 mLs in the mouth or throat every 3 (three) hours as needed for mouth pain.  [name] liraglutide (VICTOZA) 18 MG/3ML SOPN Inject 0.6mg  once  daily for 7 days, then increase to 1.2mg  once daily.  [name] lisinopril (ZESTRIL) 40 MG tablet Take 1 tablet (40 mg total) by mouth daily.   No facility-administered encounter medications on file as of 08/21/2019.     Lab Results  Component Value Date   HGBA1C 12.2 (H) 06/15/2019    Lab Results  Component Value Date   CHOL 137 07/26/2019   HDL 41 (L) 07/26/2019   LDLCALC 70 07/26/2019   TRIG 187 (H) 07/26/2019   CHOLHDL 3.3 07/26/2019   Lab Results  Component Value Date   HEPCAB REACTIVE (A) 07/26/2019    BP Readings from Last 3 Encounters:  07/26/19 120/80  06/15/19 (!) 126/93  05/20/19 120/78      Functional Status Survey: Is the patient deaf or have difficulty hearing?: No Does the patient have difficulty seeing, even when wearing glasses/contacts?: No Does the patient have difficulty concentrating, remembering, or making decisions?: No Does the patient have difficulty walking or climbing stairs?: No Does the patient have difficulty dressing or bathing?: No Does the patient have difficulty doing errands alone such as visiting a doctor's office or shopping?: No  Goals Addressed            This Visit's Progress   . Chronic Disease Management       Current Barriers:  . Chronic Disease Management support and education needs related to Hypertension, and Diabetes. History of anxiety and depression.  Case Manager Clinical Goal(s):  [name] Over the next 90 days, patient will take all medications as prescribed. [name]  Over the next 90 days, patient will attend all appointments as scheduled. . Over the next 90 days, patient will monitor her blood pressure daily and record readings. . Over the next 90 days, patient will monitor her blood glucose daily and maintain a log. . Over the next 90 days, patient will work with Care Management team to address concerns regarding medications, health education and community resources.  Interventions:  . Reviewed medications. Patient encouraged  to take all medications as prescribed. Encouraged to notify provider or care management team with concerns regarding medication management or prescription costs. . Provided education regarding established blood pressure parameters and indications for notifying provider. Encouraged to monitor daily and record readings. . Provided education regarding hypoglycemia and hyperglycemia. Discussed recommended treatment interventions. Patient encouraged to monitor blood glucose as directed by provider. Encouraged to maintain a log to identify trends.  . Discussed current health needs and availability of community resources. Encouraged to inform care management team if additional referrals are needed. . Reviewed pending appointments. Encouraged to attend medical appointments as scheduled to prevent delays in care. Encouraged to notify care management team if transportation assistance is needed. . Discussed plan for ongoing care management and follow up. Provided direct contact information for care management team. .   Patient Self Care Activities:  . Self administers medications  . Attends scheduled provider appointments . Performs ADL's independently . Performs IADL's independently .   Initial goal documentation          Kelly [name] was given information about Care Management services including:  1. Care Management services include personalized support from designated clinical staff supervised by a physician, including individualized plan of care and coordination with other care providers 2. 24/7 contact phone numbers for assistance for urgent and routine care needs. 3. The patient may stop Care Management services at any time (effective at the end of the month) by phone call to the office staff.  Patient agreed to services and verbal consent obtained. Kelly [name] agreed to outreach in 3 months.   PLAN The care management team will follow up with Kelly [name] in 3 months. She was encouraged to  call with any health related concerns if needed prior to the next outreach.   [facility] Care Management [phone]

## 2019-08-22 ENCOUNTER — Encounter: Payer: Self-pay | Admitting: Family Medicine

## 2019-08-22 ENCOUNTER — Other Ambulatory Visit: Payer: Self-pay

## 2019-08-22 ENCOUNTER — Ambulatory Visit: Payer: Medicaid Other | Admitting: Family Medicine

## 2019-08-22 VITALS — BP 124/80 | HR 97 | Temp 97.9°F | Resp 16 | Ht 64.0 in | Wt 188.6 lb

## 2019-08-22 DIAGNOSIS — R350 Frequency of micturition: Secondary | ICD-10-CM | POA: Diagnosis not present

## 2019-08-22 DIAGNOSIS — Z23 Encounter for immunization: Secondary | ICD-10-CM

## 2019-08-22 DIAGNOSIS — M545 Low back pain, unspecified: Secondary | ICD-10-CM

## 2019-08-22 DIAGNOSIS — E119 Type 2 diabetes mellitus without complications: Secondary | ICD-10-CM

## 2019-08-22 DIAGNOSIS — B1920 Unspecified viral hepatitis C without hepatic coma: Secondary | ICD-10-CM

## 2019-08-22 NOTE — Progress Notes (Signed)
Name: Kelly [name]   MRN: [id number]    DOB: [birth year]   Date:08/22/2019       Progress Note  Subjective  Chief Complaint  Chief Complaint  Patient presents with  . Diabetes    follow up    HPI  Type 2 diabetes mellitus, diagnosed 2 years ago. On Levemir 24units nightly, and 4 units of short acting insulin prior to meals TID.  Has not yet started Victoza, ozempic was not covered.  She avoids sweets, sodas, and white carbohydrates.  Trying to eat high protein.  She is seeing her blood sugars drop into the 200's now, had 1 FBS in the 100's.  She has not been titrating her insulin as she was directed.  Denies family or personal history of thyroid cancer or pancreatitis.  Has  never seen endocrinology, was previously managed by Phineas Real - has appt with endocrinology 09/18/2019.  Checking BG's fasting, at 10am, 12:30, 3pm - will advise to skip the 3pm check and check 2 hour post prandial with dinner if needed.  Hep C diagnosis - she was diagnosed at last visit, seeing Dr. Servando Snare 10/02/2018 for initial evaluation.  She denies abdominal pain.  She notes some flecks of yellow in her conjunctiva, but no scleral icterus, also having some constipation.  No yellow/pale stools, diarrhea.  She would like LFT's rechecked today.  Low to mid back pain: Ongoing for about 2 weeks.  Has some urinary frequency.  No dysuria, abdominal pain, blood in urine, weakness/numbness/tingling in the legs.  Has history kidney stones, but states this is significantly milder pain and feels different to her than prior stones.    Patient Active Problem List   Diagnosis Date Noted  . Major depressive disorder, recurrent episode, moderate (HCC) 06/15/2019  . CVA (cerebral vascular accident) (HCC) 06/14/2019  . Diabetes mellitus without complication (HCC) 07/11/2017  . Hypertension 07/11/2017  . Anxiety 01/19/2012    Past Surgical History:  Procedure Laterality Date  . ABDOMINAL HYSTERECTOMY    . GALLBLADDER  SURGERY      Family History  Problem Relation Age of Onset  . Breast cancer Mother     Social History   Socioeconomic History  . Marital status: Single    Spouse name: Not on file  . Number of children: 1  . Years of education: Not on file  . Highest education level: Not on file  Occupational History  . Occupation: Public affairs consultant    Comment: Education officer, environmental  Social Needs  . Financial resource strain: Not on file  . Food insecurity    Worry: Not on file    Inability: Not on file  . Transportation needs    Medical: No    Non-medical: No  Tobacco Use  . Smoking status: Never Smoker  . Smokeless tobacco: Never Used  Substance and Sexual Activity  . Alcohol use: Yes    Comment: Socially   . Drug use: No  . Sexual activity: Yes    Partners: Male    Birth control/protection: Surgical  Lifestyle  . Physical activity    Days per week: 5 days    Minutes per session: 60 min  . Stress: Rather much  Relationships  . Social connections    Talks on phone: More than three times a week    Gets together: Never    Attends religious service: Never    Active member of club or organization: No    Attends meetings of clubs or organizations: Never  Relationship status: Never married  . Intimate partner violence    Fear of current or ex partner: No    Emotionally abused: No    Physically abused: No    Forced sexual activity: No  Other Topics Concern  . Not on file  Social History Narrative   Daughter lives with patient     Current Outpatient Medications:  .  aspirin 81 MG chewable tablet, Chew by mouth daily., Disp: , Rfl:  .  hydrochlorothiazide (HYDRODIURIL) 12.5 MG tablet, Take 1 tablet (12.5 mg total) by mouth daily., Disp: 30 tablet, Rfl: 0 .  hydrOXYzine (ATARAX/VISTARIL) 10 MG tablet, Take 1 tablet (10 mg total) by mouth every 8 (eight) hours as needed for anxiety., Disp: 30 tablet, Rfl: 0 .  Insulin Detemir (LEVEMIR) 100 UNIT/ML Pen, Inject 24-50 Units into the  skin daily., Disp: 15 mL, Rfl: 3 .  lidocaine (XYLOCAINE) 2 % solution, Use as directed 15 mLs in the mouth or throat every 3 (three) hours as needed for mouth pain., Disp: 100 mL, Rfl: 0 .  liraglutide (VICTOZA) 18 MG/3ML SOPN, Inject 0.6mg  once daily for 7 days, then increase to 1.2mg  once daily., Disp: 9 mL, Rfl: 3 .  atorvastatin (LIPITOR) 40 MG tablet, Take 1 tablet (40 mg total) by mouth daily. (Patient not taking: Reported on 07/30/2019), Disp: 30 tablet, Rfl: 0 .  citalopram (CELEXA) 10 MG tablet, One tablet nightly. (Patient not taking: Reported on 07/30/2019), Disp: 90 tablet, Rfl: 0 .  insulin aspart (NOVOLOG) 100 UNIT/ML injection, Inject 4 Units into the skin 3 (three) times daily with meals. Use only if eat 50% of meal, Disp: 3.6 mL, Rfl: 0 .  Insulin Pen Needle (NOVOFINE) 32G X 6 MM MISC, 1 each by Does not apply route 4 (four) times daily. (Patient not taking: Reported on 07/30/2019), Disp: 100 each, Rfl: 4 .  lisinopril (ZESTRIL) 40 MG tablet, Take 1 tablet (40 mg total) by mouth daily., Disp: 30 tablet, Rfl: 0  No Known Allergies  I personally reviewed active problem list, medication list, allergies, notes from last encounter, lab results with the patient/caregiver today.   ROS  Ten systems reviewed and is negative except as mentioned in HPI  Objective  Vitals:   08/22/19 1319  BP: 124/80  Pulse: 97  Resp: 16  Temp: 97.9 F (36.6 C)  TempSrc: Temporal  SpO2: 99%  Weight: 188 lb 9.6 oz (85.5 kg)  Height: 5\' 4"  (1.626 m)    Body mass index is 32.37 kg/m.  Physical Exam  Constitutional: Patient appears well-developed and well-nourished. No distress.  HENT: Head: Normocephalic and atraumatic.  Eyes: Conjunctivae and EOM are normal. No scleral icterus.  Pupils are equal, round, and reactive to light.  Neck: Normal range of motion. Neck supple. No JVD present. Cardiovascular: Normal rate, regular rhythm and normal heart sounds.  No murmur heard. No BLE edema.  Pulmonary/Chest: Effort normal and breath sounds normal. No respiratory distress. Abdominal: Soft. Bowel sounds are normal, no distension. There is no tenderness. No masses. No CVA tenderness. Musculoskeletal: Normal range of motion, no joint effusions. No gross deformities Neurological: Pt is alert and oriented to person, place, and time. No cranial nerve deficit. Coordination, balance, strength, speech and gait are normal.  Skin: Skin is warm and dry. No rash noted. No erythema.  Psychiatric: Patient has a normal mood and affect. behavior is normal. Judgment and thought content normal.  No results found for this or any previous visit (from the past 72  hour(s)).  Diabetic Foot Exam: Diabetic Foot Exam - Simple   Simple Foot Form Diabetic Foot exam was performed with the following findings: Yes 08/22/2019  1:49 PM  Visual Inspection No deformities, no ulcerations, no other skin breakdown bilaterally: Yes Sensation Testing Intact to touch and monofilament testing bilaterally: Yes Pulse Check Posterior Tibialis and Dorsalis pulse intact bilaterally: Yes Comments     PHQ2/9: Depression screen [facility] 08/22/2019 08/21/2019 08/21/2019 07/26/2019  Decreased Interest 0 (No Data) 0 1  Down, Depressed, Hopeless 0 (No Data) 0 1  PHQ - 2 Score 0 - 0 2  Altered sleeping 0 - - 2  Tired, decreased energy 0 - - 1  Change in appetite 0 - - 1  Feeling bad or failure about yourself  0 - - 0  Trouble concentrating 0 - - 1  Moving slowly or fidgety/restless 0 - - 1  Suicidal thoughts 0 - - 0  PHQ-9 Score 0 - - 8  Difficult doing work/chores Not difficult at all - - Somewhat difficult   PHQ-2/9 Result is negative.    Fall Risk: Fall Risk  08/22/2019 08/21/2019 07/26/2019  Falls in the past year? 0 0 0  Number falls in past yr: 0 - 0  Injury with Fall? 0 - 0  Risk for fall due to : - Other (Comment) -  Risk for fall due to: Comment - Low Risk/Discussed possible medication side effects. -  Follow up  Falls evaluation completed Falls prevention discussed Falls evaluation completed    Assessment & Plan 1. Diabetes mellitus without complication (HCC) - She has not been titrating her insulin as she was asked - I have reached out to Hollie BeachJulie Pharm D to assist - titrate Levemir 2 units every 3 days up to 50 units or until FBS 100-140.  She has also not filled her Victoza - Hollie BeachJulie Pharm D to help with this initiation once patient has filled the prescription.  She does have appointment upcoming with endocrinology as well. - Significant education is provided today. - COMPLETE METABOLIC PANEL WITH GFR - Urine Microalbumin w/creat. Ratio - A1C per orders  2. Hepatitis C virus infection without hepatic coma, unspecified chronicity - COMPLETE METABOLIC PANEL WITH GFR - Scheduled to see Dr. Servando SnareWohl  3. Urinary frequency - Urine Culture - Urinalysis, Routine w reflex microscopic - Likely secondary to hyperglycemia  4. Acute bilateral low back pain without sciatica - Discussed gentle stretching; will check urine as well.  5. Need for Tdap vaccination - Tdap vaccine greater than or equal to 7yo IM  6. Need for pneumococcal vaccination - Per orders

## 2019-08-23 ENCOUNTER — Ambulatory Visit: Payer: Self-pay | Admitting: Pharmacist

## 2019-08-23 DIAGNOSIS — E119 Type 2 diabetes mellitus without complications: Secondary | ICD-10-CM

## 2019-08-23 NOTE — Chronic Care Management (AMB) (Signed)
  Care Management   Note  07/30/19  Name: Kelly [name] MRN: [id number] DOB: [birth year]  Successful outreach to patient for medication assistance. HIPAA identifiers verified. Patient with Medicaid coverage. Needs to verify benefits with pharmacy.   Goals Addressed            This Visit's Progress   . Medication Assistance (pt-stated)       Current Barriers:  . financial  Pharmacist Clinical Goal(s): Over the next 14 days, [name] will provide the necessary supplementary documents (proof of out of pocket prescription expenditure, proof of household income) needed for medication assistance applications to CCM pharmacist.   Interventions: . CCM pharmacist will apply for medication assistance program for insulin, Victoza made by [employer] . Consult with Raelyn Ensign to substitute Xultophy for Levemir + Victoza to ease prescription burden  Patient Self Care Activities:  [name] Gather necessary documents needed to apply for medication assistance  Initial goal documentation          Medication Assistance Findings:   Patient Assistance Programs: 1) Insulin, Victoza made by [employer] o Income requirement met: _0  Yes _1  No _2  Unknown o Out-of-pocket prescription expenditure met:    _3  Yes _4  No  _5  Unknown  <[id number]>_1<\/SYVGCYOYOOJZBFMZ>_0  Not applicable   Additional medication assistance options reviewed with patient as warranted:  Coupon and Discount pharmacy lists  Plan: Patient is to provide proof of out of pocket expenditures and proof of household income as necessary.    Follow up Telephone follow up appointment with care management team member scheduled for: 2 weeks  SIGNATURE

## 2019-08-24 LAB — URINALYSIS, ROUTINE W REFLEX MICROSCOPIC
Bacteria, UA: NONE SEEN /HPF
Bilirubin Urine: NEGATIVE
Hgb urine dipstick: NEGATIVE
Hyaline Cast: NONE SEEN /LPF
Ketones, ur: NEGATIVE
Leukocytes,Ua: NEGATIVE
Nitrite: NEGATIVE
RBC / HPF: NONE SEEN /HPF (ref 0–2)
Specific Gravity, Urine: 1.02 (ref 1.001–1.03)
pH: <=5 (ref 5.0–8.0)

## 2019-08-24 LAB — COMPLETE METABOLIC PANEL WITH GFR
AG Ratio: 1.2 (calc) (ref 1.0–2.5)
ALT: 41 U/L — ABNORMAL HIGH (ref 6–29)
AST: 27 U/L (ref 10–35)
Albumin: 4.1 g/dL (ref 3.6–5.1)
Alkaline phosphatase (APISO): 74 U/L (ref 31–125)
BUN: 15 mg/dL (ref 7–25)
CO2: 26 mmol/L (ref 20–32)
Calcium: 9.7 mg/dL (ref 8.6–10.2)
Chloride: 100 mmol/L (ref 98–110)
Creat: 0.91 mg/dL (ref 0.50–1.10)
GFR, Est African American: 87 mL/min/{1.73_m2} (ref >=60)
GFR, Est Non African American: 75 mL/min/{1.73_m2} (ref >=60)
Globulin: 3.4 g/dL (calc) (ref 1.9–3.7)
Glucose, Bld: 316 mg/dL — ABNORMAL HIGH (ref 65–99)
Potassium: 3.9 mmol/L (ref 3.5–5.3)
Sodium: 136 mmol/L (ref 135–146)
Total Bilirubin: 0.4 mg/dL (ref 0.2–1.2)
Total Protein: 7.5 g/dL (ref 6.1–8.1)

## 2019-08-24 LAB — URINE CULTURE
MICRO NUMBER:: 1161858
SPECIMEN QUALITY:: ADEQUATE

## 2019-08-24 LAB — MICROALBUMIN / CREATININE URINE RATIO
Creatinine, Urine: 72 mg/dL (ref 20–275)
Microalb Creat Ratio: 960 mcg/mg creat — ABNORMAL HIGH (ref <30)
Microalb, Ur: 69.1 mg/dL

## 2019-08-26 ENCOUNTER — Other Ambulatory Visit: Payer: Self-pay | Admitting: Family Medicine

## 2019-08-26 ENCOUNTER — Telehealth: Payer: Self-pay | Admitting: Family Medicine

## 2019-08-26 DIAGNOSIS — N390 Urinary tract infection, site not specified: Secondary | ICD-10-CM

## 2019-08-26 DIAGNOSIS — E119 Type 2 diabetes mellitus without complications: Secondary | ICD-10-CM

## 2019-08-26 DIAGNOSIS — B9620 Unspecified Escherichia coli [E. coli] as the cause of diseases classified elsewhere: Secondary | ICD-10-CM

## 2019-08-26 DIAGNOSIS — R809 Proteinuria, unspecified: Secondary | ICD-10-CM

## 2019-08-26 MED ORDER — NITROFURANTOIN MONOHYD MACRO 100 MG PO CAPS: 100.0000 mg | ORAL_CAPSULE | Freq: Two times a day (BID) | ORAL | 0 refills | Status: AC

## 2019-08-26 NOTE — Telephone Encounter (Signed)
Please call patient to review results per result note.  Please also check in on her blood sugars.  Please obtain the last 3-4 days of fasting BG's if she has them available, and I will adjust her insulin accordingly.

## 2019-08-27 MED ORDER — INSULIN ASPART 100 UNIT/ML FLEXPEN: 6.0000 [IU] | PEN_INJECTOR | Freq: Three times a day (TID) | SUBCUTANEOUS | 11 refills | Status: DC

## 2019-08-27 NOTE — Telephone Encounter (Signed)
Patient notified. BS is running 250 today, yesterday 200 and Sunday 235.

## 2019-08-27 NOTE — Telephone Encounter (Signed)
Increase Levimir to 30 units nightly; increase meal time (novolog) insulin to 6 units three times daily.

## 2019-08-27 NOTE — Addendum Note (Signed)
Addended by: Hubbard Hartshorn on: 08/27/2019 10:29 AM   Modules accepted: Orders

## 2019-08-28 NOTE — Telephone Encounter (Signed)
Patient notified

## 2019-09-03 ENCOUNTER — Ambulatory Visit: Payer: Self-pay | Admitting: Pharmacist

## 2019-09-03 NOTE — Chronic Care Management (AMB) (Deleted)
  Care Management   Note  08/24/2019 Name: Kelly [name] MRN: [id number] DOB: 10/29/1970  48 y.o. year old female referred to Chronic Care Management by Raelyn Ensign, FNP for medication management (DM).  Last office visit with Hubbard Hartshorn, FNP was 08/22/19.   Was unable to reach patient via telephone today and unfortunately, was not able to leave a voice messagel. (unsuccessful outreach #1).  Follow up plan: Telephone follow up appointment with care management team member scheduled for: within 5 days  Ruben Reason, PharmD Clinical Pharmacist ***[place] [phone]

## 2019-09-03 NOTE — Patient Instructions (Signed)
Goals Addressed            This Visit's Progress   . DM managment (pt-stated)       Current Barriers:  . Diabetes: uncontrolled, complicated by chronic medical conditions including anxiety/depression, most recent A1c 12.2% (September) . Current antihyperglycemic regimen: Novolog 6un TIDAC, Levemir 22un QHS, Victoza 0.6mg  daily . Denies hypoglycemic symptoms, including dizziness, lightheadedness, shaking, sweating . Denies hyperglycemic symptoms, including polyuria, polydipsia, polyphagia, nocturia, blurred vision, neuropathy . Current blood glucose readings: FBGs (6am) in 190s x 1 week . Cardiovascular risk reduction: o Current hypertensive regimen: lisinopril 40mg , HCTZ 12.5mg   o Current hyperlipidemia regimen: atorvastatin 40mg   o Current antiplatelet regimen: ASA 81mg    Pharmacist Clinical Goal(s):  [name] Over the next 30 days, patient will work with PharmD and primary care provider to address elevated FBG and bring them down to under 120 mg/dL  Interventions: . Comprehensive medication review performed, medication list updated in electronic medical record . Titrated insulin per Raelyn Ensign, FNP up to 26 units Levemir SQ QHS  Patient Self Care Activities:  . Patient will check blood glucose 3x daily , document, and provide at future appointments . Patient will focus on medication adherence by using phone as reminder . Patient will take medications as prescribed . Patient will contact provider with any episodes of hypoglycemia . Patient will report any questions or concerns to provider   Initial goal documentation     . COMPLETED: Medication Assistance (pt-stated)       Current Barriers:  . financial  Pharmacist Clinical Goal(s): Over the next 14 days, [name] will provide the necessary supplementary documents (proof of out of pocket prescription expenditure, proof of household income) needed for medication assistance applications to CCM pharmacist.   Interventions: . CCM  pharmacist will apply for medication assistance program for insulin, Victoza made by [employer] . Consult with Raelyn Ensign to substitute Xultophy for Levemir + Victoza to ease prescription burden  Patient Self Care Activities:  [name] Gather necessary documents needed to apply for medication assistance  Initial goal documentation

## 2019-09-03 NOTE — Chronic Care Management (AMB) (Signed)
  Care Management   Note  08/24/2019 Name: Kelly [name] MRN: [id number] DOB: [birth year]  48 y.o. year old female referred to Chronic Care Management by Raelyn Ensign, FNP for medication management (DM).  Last office visit with Hubbard Hartshorn, FNP was 08/22/19.   Was unable to reach patient via telephone today and unfortunately, was not able to leave a voice messagel. (unsuccessful outreach #1).  Follow up plan: Telephone follow up appointment with care management team member scheduled for: within 5 days  Ruben Reason, PharmD Clinical Pharmacist [place] [phone]

## 2019-09-03 NOTE — Chronic Care Management (AMB) (Signed)
  Care Management   Follow Up Note   09/03/2019 Name: Kelly [name] MRN: [id number] DOB: 08/21/1971  Subjective Kelly [name] is a 48 y.o. year old female who is a primary care patient of Hubbard Hartshorn, FNP. The care management pharmacist was consulted for DM medication management. HIPAA identifiers verified.   Review of patient status, including review of consultants reports, relevant laboratory and other test results, and collaboration with appropriate care team members and the patient's provider was performed as part of comprehensive patient evaluation and provision of chronic care management services.      Goals Addressed            This Visit's Progress   . DM managment (pt-stated)       Current Barriers:  . Diabetes: uncontrolled, complicated by chronic medical conditions including anxiety/depression, most recent A1c 12.2% (September) . Current antihyperglycemic regimen: Novolog 6un TIDAC, Levemir 22un QHS, Victoza 0.6mg  daily . Denies hypoglycemic symptoms, including dizziness, lightheadedness, shaking, sweating . Denies hyperglycemic symptoms, including polyuria, polydipsia, polyphagia, nocturia, blurred vision, neuropathy . Current blood glucose readings: FBGs (6am) in 190s x 1 week . Cardiovascular risk reduction: o Current hypertensive regimen: lisinopril 40mg , HCTZ 12.5mg   o Current hyperlipidemia regimen: atorvastatin 40mg   o Current antiplatelet regimen: ASA 81mg    Pharmacist Clinical Goal(s):  [name] Over the next 30 days, patient will work with PharmD and primary care provider to address elevated FBG and bring them down to under 120 mg/dL  Interventions: . Comprehensive medication review performed, medication list updated in electronic medical record . Titrated insulin per Raelyn Ensign, FNP up to 26 units Levemir SQ QHS  Patient Self Care Activities:  . Patient will check blood glucose 3x daily , document, and provide at future appointments . Patient will  focus on medication adherence by using phone as reminder . Patient will take medications as prescribed . Patient will contact provider with any episodes of hypoglycemia . Patient will report any questions or concerns to provider   Initial goal documentation     . COMPLETED: Medication Assistance (pt-stated)       Current Barriers:  . financial  Pharmacist Clinical Goal(s): Over the next 14 days, Kelly [name] will provide the necessary supplementary documents (proof of out of pocket prescription expenditure, proof of household income) needed for medication assistance applications to CCM pharmacist.   Interventions: . CCM pharmacist will apply for medication assistance program for insulin, Victoza made by [employer] . Consult with Raelyn Ensign to substitute Xultophy for Levemir + Victoza to ease prescription burden  Patient Self Care Activities:  [name] Gather necessary documents needed to apply for medication assistance  Initial goal documentation        Follow up:  Telephone follow up appointment with care management team member scheduled for: 1 week with PharmD  Ruben Reason, PharmD Clinical Pharmacist [facility] [phone]

## 2019-09-10 ENCOUNTER — Ambulatory Visit: Payer: Self-pay | Admitting: Pharmacist

## 2019-09-11 ENCOUNTER — Telehealth: Payer: Self-pay

## 2019-09-11 NOTE — Chronic Care Management (AMB) (Signed)
  Care Management   Note  09/11/2019 Name: Kelly [name] MRN: [id number] DOB: 03-24-1971  48 y.o. year old female referred to Care Management by Raelyn Ensign for medication management (insulin titration).    Was unable to reach patient via telephone today and have left HIPAA compliant voicemail asking patient to return my call. (unsuccessful outreach #1).   Follow up plan:  Telephone follow up appointment with care management team member scheduled for: Wednesday 12/23  Ruben Reason, PharmD Clinical Pharmacist [facility] [phone]

## 2019-09-30 DIAGNOSIS — E1165 Type 2 diabetes mellitus with hyperglycemia: Secondary | ICD-10-CM | POA: Insufficient documentation

## 2019-09-30 DIAGNOSIS — B182 Chronic viral hepatitis C: Secondary | ICD-10-CM | POA: Insufficient documentation

## 2019-10-02 ENCOUNTER — Ambulatory Visit: Payer: Self-pay | Admitting: Pharmacist

## 2019-10-03 ENCOUNTER — Other Ambulatory Visit: Payer: Self-pay

## 2019-10-03 ENCOUNTER — Other Ambulatory Visit
Admission: RE | Admit: 2019-10-03 | Discharge: 2019-10-03 | Disposition: A | Discharge status: Home / Self Care | Payer: Medicaid Other | Attending: Gastroenterology | Admitting: Gastroenterology

## 2019-10-03 ENCOUNTER — Ambulatory Visit (INDEPENDENT_AMBULATORY_CARE_PROVIDER_SITE_OTHER): Payer: Medicaid Other | Admitting: Gastroenterology

## 2019-10-03 ENCOUNTER — Encounter: Payer: Self-pay | Admitting: Gastroenterology

## 2019-10-03 VITALS — BP 168/102 | HR 109 | Temp 97.7°F | Ht 64.0 in | Wt 187.4 lb

## 2019-10-03 DIAGNOSIS — B182 Chronic viral hepatitis C: Secondary | ICD-10-CM

## 2019-10-03 LAB — HEPATIC FUNCTION PANEL
ALT: 43 U/L (ref 0–44)
AST: 37 U/L (ref 15–41)
Albumin: 3.5 g/dL (ref 3.5–5.0)
Alkaline Phosphatase: 83 U/L (ref 38–126)
Bilirubin, Direct: <0.1 mg/dL (ref 0.0–0.2)
Total Bilirubin: 0.6 mg/dL (ref 0.3–1.2)
Total Protein: 8 g/dL (ref 6.5–8.1)

## 2019-10-03 LAB — HEPATITIS B SURFACE ANTIGEN: Hepatitis B Surface Ag: NONREACTIVE

## 2019-10-03 LAB — HEPATITIS B SURFACE ANTIBODY,QUALITATIVE: Hep B S Ab: NONREACTIVE

## 2019-10-03 LAB — HEPATITIS A ANTIBODY, TOTAL: hep A Total Ab: REACTIVE — AB

## 2019-10-03 NOTE — Progress Notes (Addendum)
Gastroenterology Consultation  Referring Provider:     Hubbard Hartshorn, FNP Primary Care Physician:  Hubbard Hartshorn, FNP Primary Gastroenterologist:  Dr. Allen Norris     Reason for Consultation:     Hepatitis C        HPI:   Kelly [name] is a 49 y.o. y/o female referred for consultation & management of hepatitis C by Dr. Uvaldo Rising, Astrid Divine, FNP.  This patient comes in today after being found to have an antibody positive for hepatitis C in November of last year.  The patient also had a viral load that was 2,040,000 international units/mL.  The patient's liver enzymes have also been slightly elevated with a isolated increase in ALT.  The patient reports that she has a couple of homemade tattoos and her other risk factors that she states that her significant other has spent time in jail and she is not sure if he is positive for hepatitis C.  The patient is tearful today at our visit because she is concerned about her hepatitis C.  She denies any nausea vomiting black stools or bloody stools.  Past Medical History:  Diagnosis Date  . Anxiety   . Diabetes mellitus without complication ([place])   . Hyperlipidemia   . Hypertension     Past Surgical History:  Procedure Laterality Date  . ABDOMINAL HYSTERECTOMY    . GALLBLADDER SURGERY      Prior to Admission medications   Medication Sig Start Date End Date Taking? Authorizing Provider  aspirin 81 MG chewable tablet Chew by mouth daily.    [provider]  atorvastatin (LIPITOR) 40 MG tablet Take 1 tablet (40 mg total) by mouth daily. Patient not taking: Reported on 07/30/2019 06/15/19 06/14/20  Loletha Grayer, MD  citalopram (CELEXA) 10 MG tablet One tablet nightly. Patient not taking: Reported on 07/30/2019 07/26/19   Hubbard Hartshorn, FNP  hydrochlorothiazide (HYDRODIURIL) 12.5 MG tablet Take 1 tablet (12.5 mg total) by mouth daily. 06/15/19   Loletha Grayer, MD  hydrOXYzine (ATARAX/VISTARIL) 10 MG tablet Take 1 tablet (10 mg total) by  mouth every 8 (eight) hours as needed for anxiety. 07/26/19   Hubbard Hartshorn, FNP  insulin aspart (NOVOLOG) 100 UNIT/ML FlexPen Inject 6 Units into the skin 3 (three) times daily with meals. 08/27/19   Hubbard Hartshorn, FNP  Insulin Detemir (LEVEMIR) 100 UNIT/ML Pen Inject 24-50 Units into the skin daily. 08/01/19   Hubbard Hartshorn, FNP  Insulin Pen Needle (NOVOFINE) 32G X 6 MM MISC 1 each by Does not apply route 4 (four) times daily. Patient not taking: Reported on 07/30/2019 07/26/19   Hubbard Hartshorn, FNP  lidocaine (XYLOCAINE) 2 % solution Use as directed 15 mLs in the mouth or throat every 3 (three) hours as needed for mouth pain. 07/26/19   Hubbard Hartshorn, FNP  liraglutide (VICTOZA) 18 MG/3ML SOPN Inject 0.6mg  once daily for 7 days, then increase to 1.2mg  once daily. 08/08/19   Hubbard Hartshorn, FNP  lisinopril (ZESTRIL) 40 MG tablet Take 1 tablet (40 mg total) by mouth daily. 06/15/19 07/26/19  Loletha Grayer, MD    Family History  Problem Relation Age of Onset  . Breast cancer Mother      Social History   Tobacco Use  . Smoking status: Never Smoker  . Smokeless tobacco: Never Used  Substance Use Topics  . Alcohol use: Yes    Comment: Socially   . Drug use: No    Allergies  as of 10/03/2019  . (No Known Allergies)    Review of Systems:    All systems reviewed and negative except where noted in HPI.   Physical Exam:  There were no vitals taken for this visit. No LMP recorded. Patient has had a hysterectomy. General:   Alert,  Well-developed, well-nourished, pleasant and cooperative in NAD Head:  Normocephalic and atraumatic. Eyes:  Sclera clear, no icterus.   Conjunctiva pink. Ears:  Normal auditory acuity. Neck:  Supple; no masses or thyromegaly. Lungs:  Respirations even and unlabored.  Clear throughout to auscultation.   No wheezes, crackles, or rhonchi. No acute distress. Heart:  Regular rate and rhythm; no murmurs, clicks, rubs, or gallops. Abdomen:  Normal bowel sounds.   No bruits.  Soft, non-tender and non-distended without masses, hepatosplenomegaly or hernias noted.  No guarding or rebound tenderness.  Negative Carnett sign.   Rectal:  Deferred.  Pulses:  Normal pulses noted. Extremities:  No clubbing or edema.  No cyanosis. Neurologic:  Alert and oriented x3;  grossly normal neurologically. Skin:  Intact without significant lesions or rashes.  No jaundice. Lymph Nodes:  No significant cervical adenopathy. Psych:  Alert and cooperative. Normal mood and affect.  Imaging Studies: No results found.  Assessment and Plan:   Kelly [name] is a 49 y.o. y/o female who comes in today with a history of hepatitis C being positive with a positive viral load.  The patient has been reassured that she has likely had this for some time but she will be treated and has a good chance that sustained viral response.  The patient will have labs sent off to rule out any other cause of abnormal liver enzymes and will also have fibrosis assessment with fibersure.  The patient has been explained the plan for treatment when all of her labs are back.  The patient has also been told that she should be set up for a screening colonoscopy since she has not had a colonoscopy in the past.  The patient has agreed with the plan.    Midge Minium, MD. Clementeen Graham    Note: This dictation was prepared with Dragon dictation along with smaller phrase technology. Any transcriptional errors that result from this process are unintentional.

## 2019-10-03 NOTE — Chronic Care Management (AMB) (Signed)
  Care Management   Note  10/03/2019 Name: Kelly [name] MRN: [id number] DOB: December 25, 1970  49 y.o. year old female referred to Care Management team for diabetes medication management and insulin titration by Maurice Small, FNP.   CCM team services are being closed due to three unsuccessful outreach attempts.   Patient has been provided CM contact information if he/she wishes to engage with care managers in the future.   Karalee Height, PharmD Clinical Pharmacist [facility] [phone]

## 2019-10-04 LAB — CERULOPLASMIN: Ceruloplasmin: 29.9 mg/dL (ref 19.0–39.0)

## 2019-10-04 LAB — HCV FIBROSURE
ALPHA 2-MACROGLOBULINS, QN: 252 mg/dL (ref 110–276)
ALT (SGPT) P5P: 50 IU/L — ABNORMAL HIGH (ref 0–40)
Apolipoprotein A-1: 154 mg/dL (ref 116–209)
Bilirubin, Total: 0.2 mg/dL (ref 0.0–1.2)
Fibrosis Score: 0.19 (ref 0.00–0.21)
GGT: 218 IU/L — ABNORMAL HIGH (ref 0–60)
Haptoglobin: 255 mg/dL (ref 42–296)
Necroinflammat Activity Score: 0.25 — ABNORMAL HIGH (ref 0.00–0.17)

## 2019-10-04 LAB — ANA: Anti Nuclear Antibody (ANA): NEGATIVE

## 2019-10-04 LAB — ALPHA-1-ANTITRYPSIN: A-1 Antitrypsin, Ser: 151 mg/dL (ref 101–187)

## 2019-10-04 LAB — MITOCHONDRIAL ANTIBODIES: Mitochondrial M2 Ab, IgG: <20 Units (ref 0.0–20.0)

## 2019-10-04 LAB — ANTI-SMOOTH MUSCLE ANTIBODY, IGG: F-Actin IgG: 23 Units — ABNORMAL HIGH (ref 0–19)

## 2019-10-06 LAB — HEPATITIS C GENOTYPE

## 2019-10-07 ENCOUNTER — Telehealth: Payer: Self-pay

## 2019-10-07 NOTE — Telephone Encounter (Signed)
Pt notified of lab results

## 2019-10-07 NOTE — Telephone Encounter (Signed)
-----   Message from Midge Minium, MD sent at 10/07/2019  9:21 AM EST ----- Let the patient know she will need a Hp B vaccine and that her test showed no liver damage. She should be started on treatment once all her labs are back.

## 2019-10-10 ENCOUNTER — Other Ambulatory Visit: Payer: Self-pay | Admitting: Family Medicine

## 2019-10-10 ENCOUNTER — Other Ambulatory Visit: Payer: Self-pay

## 2019-10-10 MED ORDER — MAVYRET 100-40 MG PO TABS: 3.0000 | ORAL_TABLET | Freq: Every day | ORAL | 1 refills | Status: DC

## 2019-10-10 NOTE — Progress Notes (Signed)
Pt to hold atorvastatin while on treatment for Hep C x8 weeks per GI.

## 2019-11-11 ENCOUNTER — Encounter: Payer: Self-pay | Admitting: Family Medicine

## 2019-11-20 ENCOUNTER — Ambulatory Visit: Payer: Medicaid Other | Admitting: Family Medicine

## 2019-11-22 ENCOUNTER — Ambulatory Visit: Payer: Self-pay

## 2019-11-22 NOTE — Chronic Care Management (AMB) (Signed)
  Care Management   Note  11/22/2019 Name: Kelly [name] MRN: [id number] DOB: 03-Oct-1970   Brief outreach with [name] regarding care management services. She is currently engaged with the CCM team pharmacist. She was not available for a discussion at the time of the call but agreed to follow-up outreach with the CCM nurse. She  anticipates being available on 12/04/19.   PLAN Will follow-up on 12/04/19. Contact information was provided. [name] was encouraged to call with any health related concerns if needed prior to the scheduled outreach.    [name] Care Management [phone]

## 2019-12-04 ENCOUNTER — Other Ambulatory Visit: Payer: Self-pay

## 2019-12-04 ENCOUNTER — Ambulatory Visit: Payer: Self-pay

## 2019-12-04 DIAGNOSIS — I10 Essential (primary) hypertension: Secondary | ICD-10-CM

## 2019-12-04 DIAGNOSIS — E119 Type 2 diabetes mellitus without complications: Secondary | ICD-10-CM

## 2019-12-04 NOTE — Patient Instructions (Addendum)
Thank you for allowing the Chronic Care Management team to participate in your care.  Goals Addressed            This Visit's Progress   . Chronic Disease Management       Current Barriers:  . Chronic Disease Management support and education needs related to Hypertension, and Diabetes. History of anxiety and depression.  Case Manager Clinical Goal(s):  [name] Over the next 90 days, patient will take all medications as prescribed. . Over the next 90 days, patient will attend all appointments as scheduled. . Over the next 90 days, patient will monitor her blood pressure daily and record readings. . Over the next 90 days, patient will monitor her blood glucose daily and maintain a log. . Over the next 90 days, patient will work with Care Management team to address concerns regarding medications, health education and community resources.   Interventions:  . Reviewed medications. Patient encouraged to take all medications as prescribed. Encouraged to notify provider or care management team with concerns regarding medication management or prescription costs. Reports taking all medications as prescribed. Denies current concerns regarding prescriptions cost. . Provided education regarding established blood pressure parameters and indications for notifying provider. Encouraged to monitor daily and record readings. Reports monitoring blood pressure at home as recommended. Unable to recall most recent readings but reports blood pressure has been within range. . Provided education regarding hypoglycemia and hyperglycemia. Discussed recommended treatment interventions. Patient encouraged to monitor blood glucose as directed by provider. Encouraged to maintain a log to identify trends. Reports fasting blood glucose levels in the 100's. Reports postprandial levels in the low to mid 200's.  . Discussed current health needs and availability of community resources. Encouraged to inform care management team if  additional referrals are needed. Declines current need for additional community resources. . Reviewed pending appointments. Encouraged to attend medical appointments as scheduled to prevent delays in care. Encouraged to notify care management team if transportation assistance is needed. Declines concerns regarding transportation. Pending appointment with Primary Care Provider on 12/13/19. Pending appointment with Endocrinologist on 12/18/19. [name] Discussed plan for ongoing care management and follow up. Provided direct contact information for care management team.   Patient Self Care Activities:  . Self administers medications  . Attends scheduled provider appointments . Performs ADL's independently . Performs IADL's independently .   Please see past updates related to this goal by clicking on the "Past Updates" button in the selected goal         Kelly [name] verbalized understanding of the instructions provided during the telephonic outreach today. She declined need for a printed/mailed copy of the instructions.   Kelly [name] has pending appointments later this month. The care management team will follow up early next month to discuss updates and reevaluate care management needs. She was encouraged to call with any health related concerns if needed prior to the next outreach.   [name] Care Management [phone]

## 2019-12-04 NOTE — Chronic Care Management (AMB) (Signed)
Care Management   Follow Up Note   12/04/2019 Name: Kelly [name] MRN: [id number] DOB: 01/16/1971  Primary Care Provider: Hubbard Hartshorn, FNP Reason for referral : Care Management   Kelly [name] is a 49 y.o. year old female who is a primary care patient of Hubbard Hartshorn, FNP. She is currently engaged with the care management team. A routine telephonic outreach was conducted today.  Review of [name]'s status, including review of consultants reports, relevant labs and test results was conducted today. Collaboration with appropriate care team members was performed as part of the comprehensive evaluation and provision of chronic care management services.    SDOH (Social Determinants of Health) assessments performed: Yes See Care Plan activities for detailed interventions related to SDOH)    OBJECTIVE:  BP Readings from Last 3 Encounters:  10/03/19 (!) 168/102  08/22/19 124/80  07/26/19 120/80    Lab Results  Component Value Date   HGBA1C 12.2 (H) 06/15/2019    Goals Addressed            This Visit's Progress   . Chronic Disease Management       Current Barriers:  . Chronic Disease Management support and education needs related to Hypertension, and Diabetes. History of anxiety and depression.  Case Manager Clinical Goal(s):  [name] Over the next 90 days, patient will take all medications as prescribed. . Over the next 90 days, patient will attend all appointments as scheduled. . Over the next 90 days, patient will monitor her blood pressure daily and record readings. . Over the next 90 days, patient will monitor her blood glucose daily and maintain a log. . Over the next 90 days, patient will work with Care Management team to address concerns regarding medications, health education and community resources.   Interventions:  . Reviewed medications. Patient encouraged to take all medications as prescribed. Encouraged to notify provider or care management team with  concerns regarding medication management or prescription costs. Reports taking all medications as prescribed. Denies current concerns regarding prescriptions cost. . Provided education regarding established blood pressure parameters and indications for notifying provider. Encouraged to monitor daily and record readings. Reports monitoring blood pressure at home as recommended. Unable to recall most recent readings but reports blood pressure has been within range. . Provided education regarding hypoglycemia and hyperglycemia. Discussed recommended treatment interventions. Patient encouraged to monitor blood glucose as directed by provider. Encouraged to maintain a log to identify trends. Reports fasting blood glucose levels in the 100's. Reports postprandial levels in the low to mid 200's.  . Discussed current health needs and availability of community resources. Encouraged to inform care management team if additional referrals are needed. Declines current need for additional community resources. . Reviewed pending appointments. Encouraged to attend medical appointments as scheduled to prevent delays in care. Encouraged to notify care management team if transportation assistance is needed. Declines concerns regarding transportation. Pending appointment with Primary Care Provider on 12/13/19. Pending appointment with Endocrinologist on 12/18/19. [name] Discussed plan for ongoing care management and follow up. Provided direct contact information for care management team.   Patient Self Care Activities:  . Self administers medications  . Attends scheduled provider appointments . Performs ADL's independently . Performs IADL's independently .   Please see past updates related to this goal by clicking on the "Past Updates" button in the selected goal          PLAN Mr. Torrens has pending appointments later this month. The care management  team will follow up early next month to discuss updates and reevaluate  care management needs. She was encouraged to call with any health related concerns if needed prior to the next outreach.   [name] Care Management [phone]

## 2019-12-09 NOTE — Patient Instructions (Addendum)
Thank you for allowing the Chronic Care Management team to participate in your care.  Goals Addressed            This Visit's Progress   . Chronic Disease Management       Current Barriers:  . Chronic Disease Management support and education needs related to Hypertension, and Diabetes. History of anxiety and depression.  Case Manager Clinical Goal(s):  Kelly [name] Over the next 90 days, patient will take all medications as prescribed. . Over the next 90 days, patient will attend all appointments as scheduled. . Over the next 90 days, patient will monitor her blood pressure daily and record readings. . Over the next 90 days, patient will monitor her blood glucose daily and maintain a log. . Over the next 90 days, patient will work with Care Management team to address concerns regarding medications, health education and community resources.  Interventions:  . Reviewed medications. Patient encouraged to take all medications as prescribed. Encouraged to notify provider or care management team with concerns regarding medication management or prescription costs. . Provided education regarding established blood pressure parameters and indications for notifying provider. Encouraged to monitor daily and record readings. . Provided education regarding hypoglycemia and hyperglycemia. Discussed recommended treatment interventions. Patient encouraged to monitor blood glucose as directed by provider. Encouraged to maintain a log to identify trends.  . Discussed current health needs and availability of community resources. Encouraged to inform care management team if additional referrals are needed. . Reviewed pending appointments. Encouraged to attend medical appointments as scheduled to prevent delays in care. Encouraged to notify care management team if transportation assistance is needed. . Discussed plan for ongoing care management and follow up. Provided direct contact information for care management  team. .   Patient Self Care Activities:  . Self administers medications  . Attends scheduled provider appointments . Performs ADL's independently . Performs IADL's independently .   Initial goal documentation       Kelly [name] was given information about care management services  including:  1. Care management services include personalized support from designated clinical staff supervised by her physician, including individualized plan of care and coordination with other care providers 2. 24/7 contact phone numbers for assistance for urgent and routine care needs. 3. The patient may stop care management services at any time by phone call to the office staff.  Patient agreed to services and verbal consent obtained.    Kelly [name] verbalized understanding of the instructions provided during the telephonic outreach today. Declined current need for a printed/mailed copy of the instructions. Agreed to notify the case manager if mailed resources are needed. She agreed to follow up outreach/update in 3 months.    The care management team will follow up with Kelly [name] in 3 months. She was encouraged to call with any health related concerns if needed prior to the next outreach.   [name] Care Management [phone]

## 2019-12-13 ENCOUNTER — Other Ambulatory Visit: Payer: Self-pay

## 2019-12-13 ENCOUNTER — Encounter: Payer: Self-pay | Admitting: Family Medicine

## 2019-12-13 ENCOUNTER — Ambulatory Visit: Payer: Medicaid Other | Admitting: Family Medicine

## 2019-12-13 VITALS — BP 134/82 | HR 94 | Temp 98.1°F | Resp 14 | Ht 64.0 in | Wt 186.7 lb

## 2019-12-13 DIAGNOSIS — F331 Major depressive disorder, recurrent, moderate: Secondary | ICD-10-CM

## 2019-12-13 DIAGNOSIS — E119 Type 2 diabetes mellitus without complications: Secondary | ICD-10-CM | POA: Diagnosis not present

## 2019-12-13 DIAGNOSIS — F419 Anxiety disorder, unspecified: Secondary | ICD-10-CM | POA: Diagnosis not present

## 2019-12-13 DIAGNOSIS — I10 Essential (primary) hypertension: Secondary | ICD-10-CM

## 2019-12-13 MED ORDER — HYDROXYZINE HCL 10 MG PO TABS: 10.0000 mg | ORAL_TABLET | Freq: Three times a day (TID) | ORAL | 2 refills | Status: DC | PRN

## 2019-12-13 MED ORDER — HYDROCHLOROTHIAZIDE 12.5 MG PO TABS: 12.5000 mg | ORAL_TABLET | Freq: Every day | ORAL | 3 refills | Status: DC

## 2019-12-13 MED ORDER — CITALOPRAM HYDROBROMIDE 10 MG PO TABS: ORAL_TABLET | ORAL | 3 refills | Status: DC

## 2019-12-13 MED ORDER — LISINOPRIL 40 MG PO TABS: 40.0000 mg | ORAL_TABLET | Freq: Every day | ORAL | 3 refills | Status: DC

## 2019-12-13 NOTE — Progress Notes (Signed)
Name: Kelly [name]   MRN: [id number]    DOB: 1971/09/06   Date:12/13/2019       Progress Note  Chief Complaint  Patient presents with  . Follow-up  . Diabetes    see's endo  . Hypertension  . Depression     Subjective:   Kelly [name] is a 49 y.o. female, presents to clinic for routine follow up on the conditions listed above.  Pt recently established care at our office with Maurice Small her PCP, about 4 to 5 months ago  Hep C,  recent diagnosis after initial labs with first visit mildly elevated AST and ALT.  She has established with GI and has begun treatment and is tolerating.    DM - per endocrinology, with CCM and [facility] pharmacy assistance she got and is taking Victoza 1.2 mg daily no SE or concerns No weight loss, no GI  Lab Results  Component Value Date   HGBA1C 12.2 (H) 06/15/2019   [facility] and in Dec A1C decreased to 10.4, and she has follow up next week with the specialist.  She just saw nephrology as well Dr. Thedore Mins  Recent TIA - est with neurology, Dr. Malvin Johns at [facility] neurology. She was on blood thinner but that stopped and now on ASA   Hypertension:  Currently managed on lisinopril and hydrochlorothiazide Pt reports good med compliance and denies any SE.  No lightheadedness, hypotension, syncope.   Blood pressure today is good controlled. BP Readings from Last 3 Encounters:  12/13/19 134/82  10/03/19 (!) 168/102  08/22/19 124/80   Pt denies CP, SOB, exertional sx, LE edema, palpitation, Ha's, visual disturbances Dietary efforts for BP?  He is trying to eat healthy and avoid salt.   Depression, on celexa 10 mg, mood good, dealing with stuff appropriately, she feels it is working well and she does not have any concerns about the medication and no noted side effects.  Depression screen [facility] 2/9 12/13/2019 12/04/2019 08/22/2019  Decreased Interest 0 0 0  Down, Depressed, Hopeless 0 0 0  PHQ - 2 Score 0 0 0  Altered  sleeping 0 - 0  Tired, decreased energy 0 - 0  Change in appetite 0 - 0  Feeling bad or failure about yourself  0 - 0  Trouble concentrating 0 - 0  Moving slowly or fidgety/restless 0 - 0  Suicidal thoughts 0 - 0  PHQ-9 Score 0 - 0  Difficult doing work/chores Not difficult at all - Not difficult at all         Patient Active Problem List   Diagnosis Date Noted  . Chronic active hepatitis C (HCC) 09/30/2019  . Type 2 diabetes mellitus with hyperglycemia (HCC) 09/30/2019  . Recurrent major depressive episodes, moderate (HCC) 06/15/2019  . Transient cerebral ischemia 06/14/2019  . Diabetes mellitus without complication (HCC) 07/11/2017  . Hypertension 07/11/2017  . Anxiety 01/19/2012    Past Surgical History:  Procedure Laterality Date  . ABDOMINAL HYSTERECTOMY    . GALLBLADDER SURGERY      Family History  Problem Relation Age of Onset  . Breast cancer Mother     Social History   Tobacco Use  . Smoking status: Never Smoker  . Smokeless tobacco: Never Used  Substance Use Topics  . Alcohol use: Yes    Comment: Socially   . Drug use: No      Current Outpatient Medications:  .  aspirin 81 MG chewable tablet, Chew  by mouth daily., Disp: , Rfl:  .  citalopram (CELEXA) 10 MG tablet, One tablet nightly., Disp: 90 tablet, Rfl: 0 .  Glecaprevir-Pibrentasvir (MAVYRET) 100-40 MG TABS, Take 3 tablets by mouth daily with breakfast., Disp: 84 tablet, Rfl: 1 .  hydrochlorothiazide (HYDRODIURIL) 12.5 MG tablet, Take 1 tablet (12.5 mg total) by mouth daily., Disp: 30 tablet, Rfl: 0 .  hydrOXYzine (ATARAX/VISTARIL) 10 MG tablet, Take 1 tablet (10 mg total) by mouth every 8 (eight) hours as needed for anxiety., Disp: 30 tablet, Rfl: 0 .  insulin aspart (NOVOLOG) 100 UNIT/ML FlexPen, Inject 6 Units into the skin 3 (three) times daily with meals., Disp: 15 mL, Rfl: 11 .  Insulin Detemir (LEVEMIR) 100 UNIT/ML Pen, Inject 24-50 Units into the skin daily., Disp: 15 mL, Rfl: 3 .   liraglutide (VICTOZA) 18 MG/3ML SOPN, Inject 0.6mg  once daily for 7 days, then increase to 1.2mg  once daily., Disp: 9 mL, Rfl: 3 .  Insulin Pen Needle (NOVOFINE) 32G X 6 MM MISC, 1 each by Does not apply route 4 (four) times daily., Disp: 100 each, Rfl: 4 .  lisinopril (ZESTRIL) 40 MG tablet, Take 1 tablet (40 mg total) by mouth daily., Disp: 30 tablet, Rfl: 0  No Known Allergies  Chart Review Today: I personally reviewed active problem list, medication list, allergies, family history, social history, health maintenance, notes from last encounter, lab results, imaging with the patient/caregiver today.   Review of Systems  10 Systems reviewed and are negative for acute change except as noted in the HPI.   Objective:    Vitals:   12/13/19 1508  BP: 134/82  Pulse: 94  Resp: 14  Temp: 98.1 F (36.7 C)  SpO2: 99%  Weight: 186 lb 11.2 oz (84.7 kg)  Height: 5\' 4"  (1.626 m)    Body mass index is 32.05 kg/m.  Physical Exam Vitals and nursing note reviewed.  Constitutional:      General: She is not in acute distress.    Appearance: Normal appearance. She is well-developed. She is not ill-appearing, toxic-appearing or diaphoretic.     Interventions: Face mask in place.  HENT:     Head: Normocephalic and atraumatic.     Right Ear: External ear normal.     Left Ear: External ear normal.  Eyes:     General: Lids are normal. No scleral icterus.       Right eye: No discharge.        Left eye: No discharge.     Conjunctiva/sclera: Conjunctivae normal.  Neck:     Trachea: Phonation normal. No tracheal deviation.  Cardiovascular:     Rate and Rhythm: Normal rate and regular rhythm.     Pulses: Normal pulses.          Radial pulses are 2+ on the right side and 2+ on the left side.       Posterior tibial pulses are 2+ on the right side and 2+ on the left side.     Heart sounds: Normal heart sounds. No murmur. No friction rub. No gallop.   Pulmonary:     Effort: Pulmonary effort is  normal. No respiratory distress.     Breath sounds: Normal breath sounds. No stridor. No wheezing, rhonchi or rales.  Chest:     Chest wall: No tenderness.  Abdominal:     General: Bowel sounds are normal. There is no distension.     Palpations: Abdomen is soft.     Tenderness: There is no abdominal  tenderness. There is no guarding or rebound.  Musculoskeletal:        General: No deformity. Normal range of motion.     Cervical back: Normal range of motion and neck supple.     Right lower leg: No edema.     Left lower leg: No edema.  Lymphadenopathy:     Cervical: No cervical adenopathy.  Skin:    General: Skin is warm and dry.     Capillary Refill: Capillary refill takes less than 2 seconds.     Coloration: Skin is not jaundiced or pale.     Findings: No rash.  Neurological:     Mental Status: She is alert and oriented to person, place, and time.     Motor: No abnormal muscle tone.     Gait: Gait normal.  Psychiatric:        Mood and Affect: Mood normal.        Speech: Speech normal.        Behavior: Behavior normal.       PHQ2/9: Depression screen [facility] 2/9 12/13/2019 12/04/2019 08/22/2019 08/21/2019 08/21/2019  Decreased Interest 0 0 0 (No Data) 0  Down, Depressed, Hopeless 0 0 0 (No Data) 0  PHQ - 2 Score 0 0 0 - 0  Altered sleeping 0 - 0 - -  Tired, decreased energy 0 - 0 - -  Change in appetite 0 - 0 - -  Feeling bad or failure about yourself  0 - 0 - -  Trouble concentrating 0 - 0 - -  Moving slowly or fidgety/restless 0 - 0 - -  Suicidal thoughts 0 - 0 - -  PHQ-9 Score 0 - 0 - -  Difficult doing work/chores Not difficult at all - Not difficult at all - -    phq 9 is neg  Fall Risk: Fall Risk  12/13/2019 08/22/2019 08/21/2019 07/26/2019  Falls in the past year? 0 0 0 0  Number falls in past yr: 0 0 - 0  Injury with Fall? 0 0 - 0  Risk for fall due to : - - Other (Comment) -  Risk for fall due to: Comment - - Low Risk/Discussed possible medication side effects. -    Follow up - Falls evaluation completed Falls prevention discussed Falls evaluation completed    Functional Status Survey: Is the patient deaf or have difficulty hearing?: No Does the patient have difficulty seeing, even when wearing glasses/contacts?: No Does the patient have difficulty concentrating, remembering, or making decisions?: No Does the patient have difficulty walking or climbing stairs?: No Does the patient have difficulty dressing or bathing?: No Does the patient have difficulty doing errands alone such as visiting a doctor's office or shopping?: No   Assessment & Plan:   1. Diabetes mellitus without complication [facility]) Per endocrinology, patient has follow-up next week, A1c is improving but diabetes has been uncontrolled, compliant with medications no side effects concerns no hypoglycemia  2. Hypertension, unspecified type Stable, well controlled, compliant with medications no side effects or concerns, medications were refilled and basic labs were recently done with other specialist and reviewed today, no concern with renal function or electrolytes. - hydrochlorothiazide (HYDRODIURIL) 12.5 MG tablet; Take 1 tablet (12.5 mg total) by mouth daily.  Dispense: 90 tablet; Refill: 3 - lisinopril (ZESTRIL) 40 MG tablet; Take 1 tablet (40 mg total) by mouth daily.  Dispense: 90 tablet; Refill: 3  3. Major depressive disorder, recurrent episode, moderate (HCC) Depression is well controlled currently with  Celexa and anxiety is effectively minimized with taking hydroxyzine 10 mg daily. - citalopram (CELEXA) 10 MG tablet; One tablet nightly.  Dispense: 90 tablet; Refill: 3 - hydrOXYzine (ATARAX/VISTARIL) 10 MG tablet; Take 1 tablet (10 mg total) by mouth every 8 (eight) hours as needed for anxiety.  Dispense: 90 tablet; Refill: 2  4. Anxiety Well controlled with hydroxyzine daily - citalopram (CELEXA) 10 MG tablet; One tablet nightly.  Dispense: 90 tablet; Refill: 3 - hydrOXYzine  (ATARAX/VISTARIL) 10 MG tablet; Take 1 tablet (10 mg total) by mouth every 8 (eight) hours as needed for anxiety.  Dispense: 90 tablet; Refill: 2   Return for 6 month CPE with me.   Delsa Grana, PA-C 12/13/19 3:22 PM

## 2019-12-23 ENCOUNTER — Telehealth: Payer: Self-pay

## 2019-12-24 ENCOUNTER — Ambulatory Visit: Payer: Self-pay

## 2019-12-24 DIAGNOSIS — E119 Type 2 diabetes mellitus without complications: Secondary | ICD-10-CM

## 2019-12-24 DIAGNOSIS — I10 Essential (primary) hypertension: Secondary | ICD-10-CM

## 2019-12-24 NOTE — Chronic Care Management (AMB) (Signed)
  Care Management   Follow Up Note   12/24/2019 Name: Kelly [name] MRN: [id number] DOB: Feb 24, 1971  Primary Care Provider: Doren Custard, FNP Reason for referral : Chronic Care Management   Kelly [name] is a 49 y.o. year old female who is a primary care patient of Doren Custard, FNP. She is currently engaged with the care management team. A routine telephonic outreach was conducted today.  Review of Kelly [name]'s status, including review of consultants reports, relevant labs and test results was conducted today. Collaboration with appropriate care team members was performed as part of the comprehensive evaluation and provision of chronic care management services.      Goals Addressed            This Visit's Progress   . Chronic Disease Management       Current Barriers:  . Chronic Disease Management support and education needs related to Hypertension, and Diabetes. History of anxiety and depression.  Case Manager Clinical Goal(s):  [name] Over the next 90 days, patient will take all medications as prescribed. . Over the next 90 days, patient will attend all appointments as scheduled. . Over the next 90 days, patient will monitor her blood pressure daily and record readings. . Over the next 90 days, patient will monitor her blood glucose daily and maintain a log. . Over the next 90 days, patient will work with Care Management team to address concerns regarding medications, health education and community resources.   Interventions:  . Reviewed medications. Patient encouraged to take all medications as prescribed. Encouraged to notify provider or care management team with concerns regarding medication management or prescription costs.  Denies current concerns regarding medication management or prescriptions cost. . Provided education regarding established blood pressure parameters and indications for notifying provider. Encouraged to monitor daily and record readings. Reports  readings have been in range. . Provided education regarding hypoglycemia and hyperglycemia. Discussed recommended treatment interventions. Patient encouraged to monitor blood glucose as directed by provider. Encouraged to maintain a log to identify trends. Reports readings have been within range. . Reviewed pending appointments. Encouraged to attend medical appointments as scheduled to prevent delays in care.  . Discussed plan for ongoing care management and follow up. Prefers outreach at the beginning of the week due to current work schedule.   Patient Self Care Activities:  . Self administers medications  . Attends scheduled provider appointments . Performs ADL's independently . Performs IADL's independently .   Please see past updates related to this goal by clicking on the "Past Updates" button in the selected goal         PLAN The care management team will follow up with Kelly [name] within the next two months.   [name] Care Management [phone]

## 2019-12-27 ENCOUNTER — Other Ambulatory Visit: Payer: Self-pay | Admitting: Family Medicine

## 2019-12-27 DIAGNOSIS — F331 Major depressive disorder, recurrent, moderate: Secondary | ICD-10-CM

## 2019-12-27 DIAGNOSIS — F419 Anxiety disorder, unspecified: Secondary | ICD-10-CM

## 2020-01-15 NOTE — Patient Instructions (Signed)
  Thank you for allowing the Chronic Care Management team to participate in your care.   Goals Addressed            This Visit's Progress   . Chronic Disease Management       Current Barriers:  . Chronic Disease Management support and education needs related to Hypertension, and Diabetes. History of anxiety and depression.  Case Manager Clinical Goal(s):  [name] Over the next 90 days, patient will take all medications as prescribed. . Over the next 90 days, patient will attend all appointments as scheduled. . Over the next 90 days, patient will monitor her blood pressure daily and record readings. . Over the next 90 days, patient will monitor her blood glucose daily and maintain a log. . Over the next 90 days, patient will work with Care Management team to address concerns regarding medications, health education and community resources.   Interventions:  . Reviewed medications. Patient encouraged to take all medications as prescribed. Encouraged to notify provider or care management team with concerns regarding medication management or prescription costs.  Denies current concerns regarding medication management or prescriptions cost. . Provided education regarding established blood pressure parameters and indications for notifying provider. Encouraged to monitor daily and record readings. Reports readings have been in range. . Provided education regarding hypoglycemia and hyperglycemia. Discussed recommended treatment interventions. Patient encouraged to monitor blood glucose as directed by provider. Encouraged to maintain a log to identify trends. Reports readings have been within range. . Reviewed pending appointments. Encouraged to attend medical appointments as scheduled to prevent delays in care.  . Discussed plan for ongoing care management and follow up. Prefers outreach at the beginning of the week due to current work schedule.   Patient Self Care Activities:  . Self administers  medications  . Attends scheduled provider appointments . Performs ADL's independently . Performs IADL's independently .   Please see past updates related to this goal by clicking on the "Past Updates" button in the selected goal        Kelly [name] verbalized understanding of the instructions provided during the telephonic outreach today. Declined need for mailed/printed instructions.    The care management team will follow up with Kelly [name] within the next two months.     [name] Care Management [phone]

## 2020-03-09 ENCOUNTER — Ambulatory Visit: Payer: Self-pay

## 2020-03-09 ENCOUNTER — Telehealth: Payer: Self-pay

## 2020-03-09 NOTE — Chronic Care Management (AMB) (Signed)
  Chronic Care Management   Outreach Note  03/09/2020 Name: Kelly [name] MRN: [id number] DOB: 06-09-1971  Primary Care Provider: Doren Custard, FNP Reason for referral : Chronic Care Management   An unsuccessful telephone outreach was attempted today. [name] is currently engaged with the chronic care management team.  A HIPAA compliant voice message was left today requesting a return call.    Follow Up Plan:  The care management team will reach out to [name] again within the next two weeks.   [name] Care Management [phone]

## 2020-03-18 ENCOUNTER — Telehealth: Payer: Self-pay

## 2020-03-30 ENCOUNTER — Ambulatory Visit: Payer: Self-pay

## 2020-03-30 DIAGNOSIS — I10 Essential (primary) hypertension: Secondary | ICD-10-CM

## 2020-03-30 DIAGNOSIS — E119 Type 2 diabetes mellitus without complications: Secondary | ICD-10-CM

## 2020-04-03 NOTE — Patient Instructions (Addendum)
Thank you for allowing the Chronic Care Management team to participate in your care.   Goals Addressed            This Visit's Progress   . COMPLETED: Chronic Disease Management       Current Barriers:  . Chronic Disease Management support and education needs related to Hypertension, and Diabetes. History of anxiety and depression.  Case Manager Clinical Goal(s):  [name] Over the next 90 days, patient will take all medications as prescribed. . Over the next 90 days, patient will attend all appointments as scheduled. . Over the next 90 days, patient will monitor her blood pressure daily and record readings. . Over the next 90 days, patient will monitor her blood glucose daily and maintain a log. . Over the next 90 days, patient will work with Care Management team to address concerns regarding medications, health education and community resources.   Interventions:  . Plan for care management. Ms.Estock has met her care management goals. Continues to work. No urgent concerns or changes in care management needs. The team will gladly outreach again if additional assistance is required in the future.   Patient Self Care Activities:  . Self administers medications  . Attends scheduled provider appointments . Performs ADL's independently . Performs IADL's independently   Please see past updates related to this goal by clicking on the "Past Updates" button in the selected goal         Ms. Figler has met her care management goals. No further outreach required. She will complete a PCP evaluation on 06/18/20. The care management team will gladly outreach in the future if additional assistance is needed.   [facility] Care Management [phone]

## 2020-04-03 NOTE — Chronic Care Management (AMB) (Signed)
  Care Management   Follow Up Note    Name: Kelly [name] MRN: [id number] DOB: 02-27-1971  Primary Care Provider: Delsa Grana, PA-C Reason for referral : Care Management   Kelly [name] is a 49 y.o. year old female who is a primary care patient of Hubbard Hartshorn, FNP. She was engaged with the case management team for care management and care coordination.  Review of Kelly [name] status, including review of consultants reports, relevant labs and test results was conducted today. Collaboration with appropriate care team members was performed as part of the comprehensive evaluation and provision of chronic care management services.    SDOH (Social Determinants of Health) assessments performed: No See Care Plan activities for detailed interventions related to SDOH)     Goals Addressed            This Visit's Progress   . COMPLETED: Chronic Disease Management       Current Barriers:  . Chronic Disease Management support and education needs related to Hypertension, and Diabetes. History of anxiety and depression.  Case Manager Clinical Goal(s):  [name] Over the next 90 days, patient will take all medications as prescribed. . Over the next 90 days, patient will attend all appointments as scheduled. . Over the next 90 days, patient will monitor her blood pressure daily and record readings. . Over the next 90 days, patient will monitor her blood glucose daily and maintain a log. . Over the next 90 days, patient will work with Care Management team to address concerns regarding medications, health education and community resources.   Interventions:  . Plan for care management. Kelly [name] has met her care management goals. Continues to work. No urgent concerns or changes in care management needs. The team will gladly outreach again if additional assistance is required in the future.   Patient Self Care Activities:  . Self administers medications  . Attends scheduled provider  appointments . Performs ADL's independently . Performs IADL's independently   Please see past updates related to this goal by clicking on the "Past Updates" button in the selected goal          PLAN Kelly [name] has met her care management goals. No further outreach required. She will complete a PCP evaluation on 06/18/20. The care management team will gladly outreach in the future if additional assistance is needed.   [facility] Care Management [phone]

## 2020-04-07 ENCOUNTER — Other Ambulatory Visit: Payer: Self-pay | Admitting: Family Medicine

## 2020-04-07 NOTE — Telephone Encounter (Signed)
Medication Refill - Medication: Accu Check Test strips, lancets, meter  Has the patient contacted their pharmacy? Yes.   (Agent: If no, request that the patient contact the pharmacy for the refill.) (Agent: If yes, when and what did the pharmacy advise?)  Preferred Pharmacy (with phone number or street name):  [facility] [address], [place] - [address]  [address] [place] [address]  Phone: [phone] Fax: [phone]  Hours: Not open 24 hours     Agent: Please be advised that RX refills may take up to 3 business days. We ask that you follow-up with your pharmacy.

## 2020-04-07 NOTE — Telephone Encounter (Signed)
Diabetic supplies not on active medication list. Needs to be ordered and sent to pharmacy- Routing to office.

## 2020-04-09 ENCOUNTER — Encounter: Payer: Self-pay | Admitting: Emergency Medicine

## 2020-04-09 ENCOUNTER — Emergency Department: Payer: Medicaid Other

## 2020-04-09 ENCOUNTER — Other Ambulatory Visit: Payer: Self-pay

## 2020-04-09 ENCOUNTER — Emergency Department
Admission: EM | Admit: 2020-04-09 | Discharge: 2020-04-09 | Disposition: A | Discharge status: Home / Self Care | Payer: Medicaid Other | Attending: Emergency Medicine | Admitting: Emergency Medicine

## 2020-04-09 DIAGNOSIS — B373 Candidiasis of vulva and vagina: Secondary | ICD-10-CM | POA: Diagnosis not present

## 2020-04-09 DIAGNOSIS — B3731 Acute candidiasis of vulva and vagina: Secondary | ICD-10-CM

## 2020-04-09 DIAGNOSIS — F419 Anxiety disorder, unspecified: Secondary | ICD-10-CM | POA: Diagnosis not present

## 2020-04-09 DIAGNOSIS — R519 Headache, unspecified: Secondary | ICD-10-CM | POA: Insufficient documentation

## 2020-04-09 DIAGNOSIS — R110 Nausea: Secondary | ICD-10-CM | POA: Insufficient documentation

## 2020-04-09 DIAGNOSIS — N760 Acute vaginitis: Secondary | ICD-10-CM | POA: Diagnosis not present

## 2020-04-09 DIAGNOSIS — Z794 Long term (current) use of insulin: Secondary | ICD-10-CM | POA: Insufficient documentation

## 2020-04-09 DIAGNOSIS — E1165 Type 2 diabetes mellitus with hyperglycemia: Secondary | ICD-10-CM | POA: Insufficient documentation

## 2020-04-09 DIAGNOSIS — I10 Essential (primary) hypertension: Secondary | ICD-10-CM | POA: Diagnosis not present

## 2020-04-09 DIAGNOSIS — N939 Abnormal uterine and vaginal bleeding, unspecified: Secondary | ICD-10-CM | POA: Insufficient documentation

## 2020-04-09 DIAGNOSIS — R202 Paresthesia of skin: Secondary | ICD-10-CM | POA: Diagnosis not present

## 2020-04-09 LAB — PROTIME-INR
INR: 1 (ref 0.8–1.2)
Prothrombin Time: 12.7 seconds (ref 11.4–15.2)

## 2020-04-09 LAB — DIFFERENTIAL
Abs Immature Granulocytes: 0.01 10*3/uL (ref 0.00–0.07)
Basophils Absolute: 0 10*3/uL (ref 0.0–0.1)
Basophils Relative: 1 %
Eosinophils Absolute: 0.2 10*3/uL (ref 0.0–0.5)
Eosinophils Relative: 4 %
Immature Granulocytes: 0 %
Lymphocytes Relative: 35 %
Lymphs Abs: 1.9 10*3/uL (ref 0.7–4.0)
Monocytes Absolute: 0.4 10*3/uL (ref 0.1–1.0)
Monocytes Relative: 7 %
Neutro Abs: 3 10*3/uL (ref 1.7–7.7)
Neutrophils Relative %: 53 %

## 2020-04-09 LAB — CBC
HCT: 36.9 % (ref 36.0–46.0)
Hemoglobin: 12.4 g/dL (ref 12.0–15.0)
MCH: 28.1 pg (ref 26.0–34.0)
MCHC: 33.6 g/dL (ref 30.0–36.0)
MCV: 83.5 fL (ref 80.0–100.0)
Platelets: 217 10*3/uL (ref 150–400)
RBC: 4.42 MIL/uL (ref 3.87–5.11)
RDW: 12 % (ref 11.5–15.5)
WBC: 5.6 10*3/uL (ref 4.0–10.5)
nRBC: 0 % (ref 0.0–0.2)

## 2020-04-09 LAB — WET PREP, GENITAL
Sperm: NONE SEEN
Trich, Wet Prep: NONE SEEN

## 2020-04-09 LAB — APTT: aPTT: 26 seconds (ref 24–36)

## 2020-04-09 LAB — COMPREHENSIVE METABOLIC PANEL
ALT: 16 U/L (ref 0–44)
AST: 13 U/L — ABNORMAL LOW (ref 15–41)
Albumin: 4 g/dL (ref 3.5–5.0)
Alkaline Phosphatase: 51 U/L (ref 38–126)
Anion gap: 11 (ref 5–15)
BUN: 22 mg/dL — ABNORMAL HIGH (ref 6–20)
CO2: 24 mmol/L (ref 22–32)
Calcium: 9.6 mg/dL (ref 8.9–10.3)
Chloride: 103 mmol/L (ref 98–111)
Creatinine, Ser: 0.99 mg/dL (ref 0.44–1.00)
GFR calc Af Amer: >60 mL/min (ref >60)
GFR calc non Af Amer: >60 mL/min (ref >60)
Glucose, Bld: 310 mg/dL — ABNORMAL HIGH (ref 70–99)
Potassium: 4.2 mmol/L (ref 3.5–5.1)
Sodium: 138 mmol/L (ref 135–145)
Total Bilirubin: 0.6 mg/dL (ref 0.3–1.2)
Total Protein: 8.3 g/dL — ABNORMAL HIGH (ref 6.5–8.1)

## 2020-04-09 LAB — POCT PREGNANCY, URINE: Preg Test, Ur: NEGATIVE

## 2020-04-09 MED ORDER — ACCU-CHEK GUIDE VI STRP: ORAL_STRIP | 12 refills | Status: DC

## 2020-04-09 MED ORDER — ACCU-CHEK GUIDE ME W/DEVICE KIT: PACK | 0 refills | Status: DC

## 2020-04-09 MED ORDER — LACTATED RINGERS IV BOLUS
1000.0000 mL | Freq: Once | INTRAVENOUS | Status: AC
  Administered 2020-04-09: 1000 mL via INTRAVENOUS

## 2020-04-09 MED ORDER — ACCU-CHEK SOFTCLIX LANCETS MISC: 12 refills | Status: AC

## 2020-04-09 MED ORDER — ACETAMINOPHEN 500 MG PO TABS
1000.0000 mg | ORAL_TABLET | Freq: Once | ORAL | Status: AC
  Administered 2020-04-09: 1000 mg via ORAL
  Filled 2020-04-09: qty 2

## 2020-04-09 MED ORDER — METRONIDAZOLE 500 MG PO TABS
500.0000 mg | ORAL_TABLET | Freq: Three times a day (TID) | ORAL | 0 refills | Status: AC
Start: 2020-04-09 — End: 2020-04-16

## 2020-04-09 MED ORDER — DROPERIDOL 2.5 MG/ML IJ SOLN
2.5000 mg | Freq: Once | INTRAMUSCULAR | Status: AC
  Administered 2020-04-09: 2.5 mg via INTRAVENOUS
  Filled 2020-04-09: qty 2

## 2020-04-09 MED ORDER — FLUCONAZOLE 50 MG PO TABS
150.0000 mg | ORAL_TABLET | Freq: Once | ORAL | Status: AC
  Administered 2020-04-09: 150 mg via ORAL
  Filled 2020-04-09: qty 1

## 2020-04-09 MED ORDER — KETOROLAC TROMETHAMINE 30 MG/ML IJ SOLN
15.0000 mg | Freq: Once | INTRAMUSCULAR | Status: AC
  Administered 2020-04-09: 15 mg via INTRAVENOUS
  Filled 2020-04-09: qty 1

## 2020-04-09 NOTE — Discharge Instructions (Addendum)
   You have evidence of a vaginal yeast infection and bacterial vaginosis. We treat the yeast infection with a single dose of Diflucan, which you received in the emergency department. We treat the bacterial vaginosis with a course of antibiotics called metronidazole/Flagyl.  This [facility] at [facility] [address].  Your CT scan did not show any signs of stroke or bleeding around the brain.  Your blood sugar was elevated to 330, without evidence of DKA or other more serious complications  Continue your insulin regimen at home and return to the ED with any fevers, worsening symptoms, increasing abdominal pain.

## 2020-04-09 NOTE — ED Provider Notes (Signed)
[facility] Emergency Department Provider Note ____________________________________________   First MD Initiated Contact with Patient 04/09/20 1316     (approximate)  I have reviewed the triage vital signs and the nursing notes.  HISTORY  Chief Complaint Vaginal Bleeding and Headache   HPI Farzana Koci is a 49 y.o. female presents to ED with vaginal bleeding headache.  Reports history of hysterectomy in 2010.  Medical history of HTN, anxiety, DM on insulin.  Patient presents to the ED with about 1 week of bitemporal throbbing headache with associated photophobia and nausea.  She denies fevers, preceding trauma, vomiting or vision changes.  Denies associated dizziness.  She denies take any medications to assist with this headache.   Patient reports waking up this morning at her typical time and noticing tingling/shooting sensations down her left arm.  She reports the sensations have improved, but she is fearful because she had a TIA 1 year ago which included symptoms of a similar quality.  She denies any weakness or changes in grip strength to her LUE, or other extremities.  Furthermore, patient is reporting small amount of vaginal bleeding this morning when she woke up.  She reports always keeping a panty liner on, and reports noticing red blood in it this morning.  She reports changing it, and has not rechecked it since then.  Denies soaking through her pants.  Denies additional discharge, dysuria, diarrhea or hematuria.  Past Medical History:  Diagnosis Date  . Anxiety   . Diabetes mellitus without complication ([place])   . Hyperlipidemia   . Hypertension     Patient Active Problem List   Diagnosis Date Noted  . Chronic active hepatitis C ([place]) 09/30/2019  . Type 2 diabetes mellitus with hyperglycemia ([place]) 09/30/2019  . Recurrent major depressive episodes, moderate ([place]) 06/15/2019  . Transient cerebral ischemia 06/14/2019  . Diabetes mellitus  without complication ([place]) 91/47/8295  . Hypertension 07/11/2017  . Anxiety 01/19/2012    Past Surgical History:  Procedure Laterality Date  . ABDOMINAL HYSTERECTOMY    . GALLBLADDER SURGERY      Prior to Admission medications   Medication Sig Start Date End Date Taking? Authorizing Provider  hydrochlorothiazide (HYDRODIURIL) 12.5 MG tablet Take 1 tablet (12.5 mg total) by mouth daily. 12/13/19  Yes Delsa Grana, PA-C  hydrOXYzine (ATARAX/VISTARIL) 10 MG tablet Take 1 tablet (10 mg total) by mouth every 8 (eight) hours as needed for anxiety. 12/13/19  Yes Delsa Grana, PA-C  liraglutide (VICTOZA) 18 MG/3ML SOPN Inject 0.16m once daily for 7 days, then increase to 1.230monce daily. 08/08/19  Yes BoHubbard HartshornFNP  lisinopril (ZESTRIL) 40 MG tablet Take 1 tablet (40 mg total) by mouth daily. 12/13/19  Yes TaDelsa GranaPA-C  Accu-Chek Softclix Lancets lancets Check BG TID DX:E11.65, LON:99 04/09/20   TaDelsa GranaPA-C  aspirin 81 MG chewable tablet Chew by mouth daily.    [provider]  Blood Glucose Monitoring Suppl (ACCU-CHEK GUIDE ME) w/Device KIT Check BG TID LON:99, DX:E11.65 04/09/20   TaDelsa GranaPA-C  Glecaprevir-Pibrentasvir (MAVYRET) 100-40 MG TABS Take 3 tablets by mouth daily with breakfast. 10/10/19   WoLucilla LameMD  glucose blood (ACCU-CHEK GUIDE) test strip Dx:E11.65, LON:99 check BG tid 04/09/20   TaDelsa GranaPA-C  insulin aspart (NOVOLOG) 100 UNIT/ML FlexPen Inject 6 Units into the skin 3 (three) times daily with meals. 08/27/19   BoHubbard HartshornFNP  Insulin Detemir (LEVEMIR) 100 UNIT/ML Pen Inject 24-50 Units into the  skin daily. 08/01/19   Hubbard Hartshorn, FNP  metroNIDAZOLE (FLAGYL) 500 MG tablet Take 1 tablet (500 mg total) by mouth 3 (three) times daily for 7 days. 04/09/20 04/16/20  Vladimir Crofts, MD    Allergies Patient has no known allergies.  Family History  Problem Relation Age of Onset  . Breast cancer Mother     Social History Social History    Tobacco Use  . Smoking status: Never Smoker  . Smokeless tobacco: Never Used  Vaping Use  . Vaping Use: Never used  Substance Use Topics  . Alcohol use: Yes    Comment: Socially   . Drug use: No    Review of Systems  Constitutional: No fever/chills Eyes: No visual changes. ENT: No sore throat. Cardiovascular: Denies chest pain. Respiratory: Denies shortness of breath. Gastrointestinal: No abdominal pain.  No nausea, no vomiting.  No diarrhea.  No constipation. Genitourinary: Negative for dysuria.  Positive for vaginal bleeding. Musculoskeletal: Negative for back pain. Skin: Negative for rash. Neurological: Negative , focal weakness or numbness.  Positive for headache and paresthesias.   ____________________________________________   PHYSICAL EXAM:  VITAL SIGNS: ED Triage Vitals  Enc Vitals Group     BP 04/09/20 1015 (!) 126/92     Pulse Rate 04/09/20 1015 91     Resp 04/09/20 1015 18     Temp 04/09/20 1015 99.1 F (37.3 C)     Temp Source 04/09/20 1015 Oral     SpO2 04/09/20 1015 100 %     Weight 04/09/20 1021 180 lb (81.6 kg)     Height 04/09/20 1021 '5\' 6"'  (1.676 m)     Head Circumference --      Peak Flow --      Pain Score 04/09/20 1021 9     Pain Loc --      Pain Edu? --      Excl. in [place]? --      Constitutional: Alert and oriented. Well appearing and in no acute distress.  Preferring a dark room due to photophobia, otherwise well-appearing and conversational in full sentences. Eyes: Conjunctivae are normal. PERRL. EOMI. Head: Atraumatic. Nose: No congestion/rhinnorhea. Mouth/Throat: Mucous membranes are moist.  Oropharynx non-erythematous. Neck: No stridor. No cervical spine tenderness to palpation. Cardiovascular: Normal rate, regular rhythm. Grossly normal heart sounds.  Good peripheral circulation. Respiratory: Normal respiratory effort.  No retractions. Lungs CTAB. Gastrointestinal: Soft , nondistended, nontender to palpation. No abdominal  bruits. No CVA tenderness. Musculoskeletal: No lower extremity tenderness nor edema.  No joint effusions. No signs of acute trauma. Neurologic:  Normal speech and language. No gross focal neurologic deficits are appreciated. No gait instability noted. Cranial nerves II through XII intact 5/5 strength and sensation to light touch in bilateral upper and lower extremities. Able to independently get up from the stretcher and ambulate with a normal gait. Skin:  Skin is warm, dry and intact. No rash noted. Psychiatric: Mood and affect are normal. Speech and behavior are normal.  ____________________________________________   LABS (all labs ordered are listed, but only abnormal results are displayed)  Labs Reviewed  WET PREP, GENITAL - Abnormal; Notable for the following components:      Result Value   Yeast Wet Prep HPF POC PRESENT (*)    Clue Cells Wet Prep HPF POC PRESENT (*)    WBC, Wet Prep HPF POC MODERATE (*)    All other components within normal limits  COMPREHENSIVE METABOLIC PANEL - Abnormal; Notable for the following components:  Glucose, Bld 310 (*)    BUN 22 (*)    Total Protein 8.3 (*)    AST 13 (*)    All other components within normal limits  PROTIME-INR  APTT  CBC  DIFFERENTIAL  POCT PREGNANCY, URINE   ____________________________________________  12 Lead EKG Sinus rhythm, rate of 90 bpm, normal axis and intervals, no evidence of acute ischemia.  ____________________________________________  RADIOLOGY  ED MD interpretation: CT head obtained due to possibility of ICH or mass due to prolonged headache and tingling sensation, results reviewed and remarkable for no evidence of acute intracranial pathology  Official radiology report(s): CT HEAD WO CONTRAST  Result Date: 04/09/2020 CLINICAL DATA:  Headache, new or worsening, neuro deficit. Additional provided: Patient reports headache with left-sided weakness and tingling in arm for 1 week. EXAM: CT HEAD WITHOUT  CONTRAST TECHNIQUE: Contiguous axial images were obtained from the base of the skull through the vertex without intravenous contrast. COMPARISON:  Brain MRI 06/14/2019, head CT 01/01/2015. FINDINGS: Brain: Cerebral volume is normal for age. There is no acute intracranial hemorrhage. No demarcated cortical infarct. No extra-axial fluid collection. No midline shift. A previously demonstrated 1.3 cm sellar/suprasellar lesion was better characterized on the prior MRI of 06/14/2019, but appears unchanged Vascular: No hyperdense vessel. Skull: Normal. Negative for fracture or focal lesion. Sinuses/Orbits: Visualized orbits show no acute finding. Mild ethmoid sinus mucosal thickening. No significant mastoid effusion. IMPRESSION: No evidence of acute intracranial hemorrhage or acute infarct. A previously demonstrated 1.3 cm sellar/suprasellar lesion was better characterized on the prior MRI of 06/14/2019, but appears unchanged. 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 However, given the provided history of headaches, consider non-emergent contrast enhanced pituitary protocol brain MRI for further evaluation. Additionally, correlate with relevant laboratory values to help exclude cystic pituitary adenoma. Mild ethmoid sinus mucosal thickening. Electronically Signed   By: Kellie Simmering DO   On: 04/09/2020 11:13    ____________________________________________   PROCEDURES  Procedure(s) performed (including Critical Care):  Procedures   ____________________________________________   INITIAL IMPRESSION / ASSESSMENT AND PLAN / ED COURSE  49 year old woman presenting with various complaints, found to have evidence of BV and vaginal yeast infection, as well as migrainous headache easily treated in the ED, amenable to outpatient management.  Patient's primary concern is a TIA, due to having history of such, the presentation is not consistent with this diagnosis.  She has isolated paresthesias to her left  arm, likely due to positional compression while sleeping.  She has no neurologic symptoms while here in the ED, and certainly no weakness to suggest neurologic event.  Her headache resolves after simple migraine cocktail and CT head does not demonstrate evidence of acute intracranial pathology.  Speculum exam due to complaints of vaginal bleeding, with evidence of cottage cheese discharge consistent with yeast infection and BV under wet prep.  These were both treated appropriately and patient will be discharged home with PCP follow-up.  Return precautions for the ED were discussed.  Clinical Course as of Apr 09 2229  Thu Apr 09, 2020  1527 Speculum exam complete.  Copious amount of white cottage cheese discharge.  Will send.   [DS]  1610 Evidence of vaginal yeast infection and bacterial vaginosis.  Will dose of Diflucan to treat her yeast infection and prescribe Flagyl for her BV.   [DS]  1629 Educated patient on diagnoses of BV and vaginal yeast infection.  Explained treatments and outpatient management.  Answered questions.  She expresses understanding and agreement.   [  DS]    Clinical Course User Index [DS] Vladimir Crofts, MD     ____________________________________________   FINAL CLINICAL IMPRESSION(S) / ED DIAGNOSES  Final diagnoses:  Acute nonintractable headache, unspecified headache type  BV (bacterial vaginosis)  Vaginal yeast infection  Vaginal bleeding  Paresthesias     ED Discharge Orders         Ordered    metroNIDAZOLE (FLAGYL) 500 MG tablet  3 times daily     Discontinue  Reprint     04/09/20 1544           Eleana Tocco Tamala Julian   Note:  This document was prepared using Dragon voice recognition software and may include unintentional dictation errors.   Vladimir Crofts, MD 04/09/20 2232

## 2020-04-09 NOTE — Addendum Note (Signed)
Addended by: Davene Costain on: 04/09/2020 11:17 AM   Modules accepted: Orders

## 2020-04-09 NOTE — Telephone Encounter (Signed)
Patient returning call. She states she needs the whole meter. She needs accucheck strips and lancets.

## 2020-04-09 NOTE — ED Triage Notes (Signed)
First Nurse Note:  ARrives [facility].  C/O hyperglycemia.  Also c/o tingling extremities x 2 weeks.  And Vaginal bleeding today.  VS wnl.  NAD

## 2020-04-09 NOTE — ED Triage Notes (Signed)
Patient presents to the ED with vaginal bleeding that began this morning.  Patient had a hysterectomy in 2010 and has had no vaginal bleeding since then until today.  Patient is also complaining of a headache that began this morning and intermittent tingling to her left arm x 1 week.

## 2020-04-09 NOTE — Telephone Encounter (Signed)
Left detailed vm °

## 2020-04-24 ENCOUNTER — Ambulatory Visit: Payer: Medicaid Other | Admitting: Family Medicine

## 2020-04-25 ENCOUNTER — Other Ambulatory Visit: Payer: Self-pay | Admitting: Family Medicine

## 2020-04-25 DIAGNOSIS — F331 Major depressive disorder, recurrent, moderate: Secondary | ICD-10-CM

## 2020-04-25 DIAGNOSIS — F419 Anxiety disorder, unspecified: Secondary | ICD-10-CM

## 2020-06-04 ENCOUNTER — Other Ambulatory Visit: Payer: Self-pay

## 2020-06-04 ENCOUNTER — Other Ambulatory Visit (HOSPITAL_COMMUNITY)
Admission: RE | Admit: 2020-06-04 | Discharge: 2020-06-04 | Disposition: A | Discharge status: Home / Self Care | Payer: Medicaid Other | Source: Ambulatory Visit | Attending: Family Medicine | Admitting: Family Medicine

## 2020-06-04 ENCOUNTER — Encounter: Payer: Self-pay | Admitting: Family Medicine

## 2020-06-04 ENCOUNTER — Ambulatory Visit: Payer: Medicaid Other | Admitting: Family Medicine

## 2020-06-04 VITALS — BP 130/76 | HR 85 | Temp 98.0°F | Resp 16 | Ht 64.0 in | Wt 197.2 lb

## 2020-06-04 DIAGNOSIS — N760 Acute vaginitis: Secondary | ICD-10-CM

## 2020-06-04 DIAGNOSIS — Z202 Contact with and (suspected) exposure to infections with a predominantly sexual mode of transmission: Secondary | ICD-10-CM | POA: Diagnosis not present

## 2020-06-04 DIAGNOSIS — Z23 Encounter for immunization: Secondary | ICD-10-CM

## 2020-06-04 NOTE — Patient Instructions (Signed)

## 2020-06-04 NOTE — Progress Notes (Signed)
Patient ID: Kelly [name], female    DOB: [birth year], 49 y.o.   MRN: [id number]  PCP: Delsa Grana, PA-C  Chief Complaint  Patient presents with  . SEXUALLY TRANSMITTED DISEASE    Subjective:   Kelly [name] is a 49 y.o. female, presents to clinic with CC of the following:  Vaginal Itching The patient's primary symptoms include genital itching and vaginal discharge. Primary symptoms comment: irritation. This is a recurrent problem. The current episode started yesterday. The problem has been unchanged. The patient is experiencing no pain. She is not pregnant. Pertinent negatives include no abdominal pain, anorexia, back pain, chills, constipation, diarrhea, discolored urine, dysuria, fever, flank pain, frequency, headaches, hematuria, joint pain, joint swelling, nausea, painful intercourse, rash, sore throat, urgency or vomiting. There has been no bleeding (s/p hysterectomy). Nothing aggravates the symptoms. She is sexually active. Yes, her partner has an STD. She uses hysterectomy for contraception. Her past medical history is significant for vaginosis.     One female partner, sexually active, female partner had penile discharge Sometimes uses condoms sometimes not  She doesn't know if he's had other partners recently      Patient Active Problem List   Diagnosis Date Noted  . Chronic active hepatitis C ([place]) 09/30/2019  . Type 2 diabetes mellitus with hyperglycemia ([employer]) 09/30/2019  . Recurrent major depressive episodes, moderate ([place]) 06/15/2019  . Transient cerebral ischemia 06/14/2019  . Diabetes mellitus without complication ([place]) 62/37/6283  . Hypertension 07/11/2017  . Anxiety 01/19/2012      Current Outpatient Medications:  .  Accu-Chek Softclix Lancets lancets, Check BG TID DX:E11.65, LON:99, Disp: 100 each, Rfl: 12 .  aspirin 81 MG chewable tablet, Chew by mouth daily., Disp: , Rfl:  .  Blood Glucose Monitoring Suppl (ACCU-CHEK GUIDE ME) w/Device KIT, Check  BG TID LON:99, DX:E11.65, Disp: 1 kit, Rfl: 0 .  Glecaprevir-Pibrentasvir (MAVYRET) 100-40 MG TABS, Take 3 tablets by mouth daily with breakfast., Disp: 84 tablet, Rfl: 1 .  glucose blood (ACCU-CHEK GUIDE) test strip, Dx:E11.65, LON:99 check BG tid, Disp: 100 each, Rfl: 12 .  hydrochlorothiazide (HYDRODIURIL) 12.5 MG tablet, Take 1 tablet (12.5 mg total) by mouth daily., Disp: 90 tablet, Rfl: 3 .  hydrOXYzine (ATARAX/VISTARIL) 10 MG tablet, TAKE 1 TABLET (10 MG TOTAL) BY MOUTH EVERY 8 (EIGHT) HOURS AS NEEDED FOR ANXIETY., Disp: 270 tablet, Rfl: 1 .  insulin aspart (NOVOLOG) 100 UNIT/ML FlexPen, Inject 6 Units into the skin 3 (three) times daily with meals., Disp: 15 mL, Rfl: 11 .  Insulin Detemir (LEVEMIR) 100 UNIT/ML Pen, Inject 24-50 Units into the skin daily., Disp: 15 mL, Rfl: 3 .  liraglutide (VICTOZA) 18 MG/3ML SOPN, Inject 0.40m once daily for 7 days, then increase to 1.273monce daily., Disp: 9 mL, Rfl: 3 .  lisinopril (ZESTRIL) 40 MG tablet, Take 1 tablet (40 mg total) by mouth daily., Disp: 90 tablet, Rfl: 3   No Known Allergies   Social History   Tobacco Use  . Smoking status: Never Smoker  . Smokeless tobacco: Never Used  Vaping Use  . Vaping Use: Never used  Substance Use Topics  . Alcohol use: Yes    Comment: Socially   . Drug use: No      Chart Review Today: I personally reviewed active problem list, medication list, allergies, family history, social history, health maintenance, notes from last encounter, lab results, imaging with the patient/caregiver today.   Review of Systems  Constitutional: Negative.  Negative for chills  and fever.  HENT: Negative.  Negative for sore throat.   Eyes: Negative.   Respiratory: Negative.   Cardiovascular: Negative.   Gastrointestinal: Negative.  Negative for abdominal pain, anorexia, constipation, diarrhea, nausea and vomiting.  Endocrine: Negative.   Genitourinary: Positive for vaginal discharge. Negative for dysuria, flank  pain, frequency, hematuria and urgency.  Musculoskeletal: Negative.  Negative for back pain and joint pain.  Skin: Negative.  Negative for rash.  Allergic/Immunologic: Negative.   Neurological: Negative.  Negative for headaches.  Hematological: Negative.   Psychiatric/Behavioral: Negative.   All other systems reviewed and are negative.      Objective:   Vitals:   06/04/20 1108  BP: 130/76  Pulse: 85  Resp: 16  Temp: 98 F (36.7 C)  TempSrc: Oral  SpO2: 98%  Weight: 197 lb 3.2 oz (89.4 kg)  Height: '5\' 4"'  (1.626 m)    Body mass index is 33.85 kg/m.  Physical Exam Vitals and nursing note reviewed.  Constitutional:      General: She is not in acute distress.    Appearance: Normal appearance. She is obese. She is not ill-appearing, toxic-appearing or diaphoretic.  HENT:     Head: Normocephalic and atraumatic.     Right Ear: External ear normal.     Left Ear: External ear normal.     Mouth/Throat:     Mouth: Mucous membranes are moist.     Pharynx: Oropharynx is clear. No oropharyngeal exudate or posterior oropharyngeal erythema.  Eyes:     General:        Right eye: No discharge.        Left eye: No discharge.     Conjunctiva/sclera: Conjunctivae normal.  Cardiovascular:     Rate and Rhythm: Normal rate and regular rhythm.     Pulses: Normal pulses.     Heart sounds: Normal heart sounds.  Pulmonary:     Effort: Pulmonary effort is normal.     Breath sounds: Normal breath sounds.  Abdominal:     General: Abdomen is protuberant. Bowel sounds are normal.     Palpations: Abdomen is soft.     Tenderness: There is no abdominal tenderness. There is no right CVA tenderness or left CVA tenderness.  Skin:    General: Skin is warm and dry.     Coloration: Skin is not jaundiced or pale.  Neurological:     Mental Status: She is alert.  Psychiatric:        Mood and Affect: Mood normal.        Behavior: Behavior normal.      Results for orders placed or performed during  the hospital encounter of 04/09/20  Wet prep, genital  Result Value Ref Range   Yeast Wet Prep HPF POC PRESENT (A) NONE SEEN   Trich, Wet Prep NONE SEEN NONE SEEN   Clue Cells Wet Prep HPF POC PRESENT (A) NONE SEEN   WBC, Wet Prep HPF POC MODERATE (A) NONE SEEN   Sperm NONE SEEN   Protime-INR  Result Value Ref Range   Prothrombin Time 12.7 11.4 - 15.2 seconds   INR 1.0 0.8 - 1.2  APTT  Result Value Ref Range   aPTT 26 24 - 36 seconds  CBC  Result Value Ref Range   WBC 5.6 4.0 - 10.5 K/uL   RBC 4.42 3.87 - 5.11 MIL/uL   Hemoglobin 12.4 12.0 - 15.0 g/dL   HCT 36.9 36 - 46 %   MCV 83.5 80.0 - 100.0 fL  MCH 28.1 26.0 - 34.0 pg   MCHC 33.6 30.0 - 36.0 g/dL   RDW 12.0 11.5 - 15.5 %   Platelets 217 150 - 400 K/uL   nRBC 0.0 0.0 - 0.2 %  Differential  Result Value Ref Range   Neutrophils Relative % 53 %   Neutro Abs 3.0 1.7 - 7.7 K/uL   Lymphocytes Relative 35 %   Lymphs Abs 1.9 0.7 - 4.0 K/uL   Monocytes Relative 7 %   Monocytes Absolute 0.4 0 - 1 K/uL   Eosinophils Relative 4 %   Eosinophils Absolute 0.2 0 - 0 K/uL   Basophils Relative 1 %   Basophils Absolute 0.0 0 - 0 K/uL   Immature Granulocytes 0 %   Abs Immature Granulocytes 0.01 0.00 - 0.07 K/uL  Comprehensive metabolic panel  Result Value Ref Range   Sodium 138 135 - 145 mmol/L   Potassium 4.2 3.5 - 5.1 mmol/L   Chloride 103 98 - 111 mmol/L   CO2 24 22 - 32 mmol/L   Glucose, Bld 310 (H) 70 - 99 mg/dL   BUN 22 (H) 6 - 20 mg/dL   Creatinine, Ser 0.99 0.44 - 1.00 mg/dL   Calcium 9.6 8.9 - 10.3 mg/dL   Total Protein 8.3 (H) 6.5 - 8.1 g/dL   Albumin 4.0 3.5 - 5.0 g/dL   AST 13 (L) 15 - 41 U/L   ALT 16 0 - 44 U/L   Alkaline Phosphatase 51 38 - 126 U/L   Total Bilirubin 0.6 0.3 - 1.2 mg/dL   GFR calc non Af Amer >60 >60 mL/min   GFR calc Af Amer >60 >60 mL/min   Anion gap 11 5 - 15  Pregnancy, urine POC  Result Value Ref Range   Preg Test, Ur NEGATIVE NEGATIVE       Assessment & Plan:   1. Acute  vaginitis Hx of BV and yeast vaginitis - she will wait for results for tx, mostly concerned with testing for STDs - Cervicovaginal ancillary only  2. Exposure to sexually transmitted disease (STD) One female partner who noted possible sx 3 days ago, pt with some mild vaginal sx onset yesterday Test and treat any positive Discussed safe sex with pt, discussed testing done today and treatments for each Will contact her in the next couple days with results No sex until both partners tested, treated and all sx improve  - Cervicovaginal ancillary only - HIV antibody (with reflex) - RPR  3. Need for influenza vaccination  - Flu Vaccine QUAD 6+ mos PF IM (Fluarix Quad PF)  F/up as needed    Delsa Grana, PA-C 06/04/20 11:33 AM

## 2020-06-05 DIAGNOSIS — Z23 Encounter for immunization: Secondary | ICD-10-CM

## 2020-06-05 LAB — CERVICOVAGINAL ANCILLARY ONLY
Bacterial Vaginitis (gardnerella): POSITIVE — AB
Candida Glabrata: NEGATIVE
Candida Vaginitis: POSITIVE — AB
Chlamydia: NEGATIVE
Comment: NEGATIVE
Comment: NEGATIVE
Comment: NEGATIVE
Comment: NEGATIVE
Comment: NEGATIVE
Comment: NORMAL
Neisseria Gonorrhea: POSITIVE — AB
Trichomonas: NEGATIVE

## 2020-06-05 LAB — RPR: RPR Ser Ql: NONREACTIVE

## 2020-06-05 LAB — HIV ANTIBODY (ROUTINE TESTING W REFLEX): HIV 1&2 Ab, 4th Generation: NONREACTIVE

## 2020-06-05 MED ORDER — FLUCONAZOLE 150 MG PO TABS: 150.0000 mg | ORAL_TABLET | ORAL | 0 refills | Status: DC | PRN

## 2020-06-05 MED ORDER — METRONIDAZOLE 500 MG PO TABS: 500.0000 mg | ORAL_TABLET | Freq: Two times a day (BID) | ORAL | 0 refills | Status: AC

## 2020-06-08 ENCOUNTER — Other Ambulatory Visit: Payer: Self-pay

## 2020-06-08 ENCOUNTER — Ambulatory Visit (INDEPENDENT_AMBULATORY_CARE_PROVIDER_SITE_OTHER): Payer: Medicaid Other

## 2020-06-08 DIAGNOSIS — A549 Gonococcal infection, unspecified: Secondary | ICD-10-CM

## 2020-06-08 MED ORDER — LIDOCAINE HCL (PF) 1 % IJ SOLN
2.0000 mL | Freq: Once | INTRAMUSCULAR | Status: AC
  Administered 2020-06-08: 2 mL via INTRADERMAL

## 2020-06-08 MED ORDER — CEFTRIAXONE SODIUM 500 MG IJ SOLR
500.0000 mg | Freq: Once | INTRAMUSCULAR | Status: AC
  Administered 2020-06-08: 500 mg via INTRAMUSCULAR

## 2020-06-18 ENCOUNTER — Encounter: Payer: Medicaid Other | Admitting: Family Medicine

## 2020-06-29 ENCOUNTER — Other Ambulatory Visit: Payer: Self-pay

## 2020-06-29 ENCOUNTER — Ambulatory Visit: Payer: Medicaid Other | Admitting: Physician Assistant

## 2020-06-29 ENCOUNTER — Encounter: Payer: Self-pay | Admitting: Physician Assistant

## 2020-06-29 DIAGNOSIS — N760 Acute vaginitis: Secondary | ICD-10-CM

## 2020-06-29 DIAGNOSIS — Z113 Encounter for screening for infections with a predominantly sexual mode of transmission: Secondary | ICD-10-CM

## 2020-06-29 DIAGNOSIS — B9689 Other specified bacterial agents as the cause of diseases classified elsewhere: Secondary | ICD-10-CM

## 2020-06-29 LAB — WET PREP FOR TRICH, YEAST, CLUE
Trichomonas Exam: NEGATIVE
Yeast Exam: NEGATIVE

## 2020-06-29 MED ORDER — METRONIDAZOLE 500 MG PO TABS: 500.0000 mg | ORAL_TABLET | Freq: Two times a day (BID) | ORAL | 0 refills | Status: AC

## 2020-06-29 NOTE — Progress Notes (Signed)
[facility] Department STI clinic/screening visit  Subjective:  Kelly [name] is a 49 y.o. female being seen today for an STI screening visit. The patient reports they do have symptoms.  Patient reports that they do not desire a pregnancy in the next year.   They reported they are not interested in discussing contraception today.  No LMP recorded. Patient has had a hysterectomy.   Patient has the following medical conditions:   Patient Active Problem List   Diagnosis Date Noted   Chronic active hepatitis C (HCC) 09/30/2019   Type 2 diabetes mellitus with hyperglycemia (HCC) 09/30/2019   Recurrent major depressive episodes, moderate (HCC) 06/15/2019   Transient cerebral ischemia 06/14/2019   Diabetes mellitus without complication (HCC) 07/11/2017   Hypertension 07/11/2017   Anxiety 01/19/2012    Chief Complaint  Patient presents with   SEXUALLY TRANSMITTED DISEASE    screening    HPI  Patient reports that she was tested and treated for [facility] about 2.5-3 weeks ago and is still having some vaginal symptoms.  States that she last had a HIV test 3 weeks ago and had a last in 2010, prior to her hysterectomy.  See flowsheet for further details and programmatic requirements.    The following portions of the patient's history were reviewed and updated as appropriate: allergies, current medications, past medical history, past social history, past surgical history and problem list.  Objective:  There were no vitals filed for this visit.  Physical Exam Constitutional:      General: She is not in acute distress.    Appearance: Normal appearance.  HENT:     Head: Normocephalic and atraumatic.     Comments: No nits,lice, or hair loss. No cervical, supraclavicular or axillary adenopathy.    Mouth/Throat:     Mouth: Mucous membranes are moist.     Pharynx: Oropharynx is clear. No oropharyngeal exudate or posterior oropharyngeal erythema.  Eyes:      Conjunctiva/sclera: Conjunctivae normal.  Pulmonary:     Effort: Pulmonary effort is normal.  Abdominal:     Palpations: Abdomen is soft. There is no mass.     Tenderness: There is no abdominal tenderness. There is no guarding or rebound.  Genitourinary:    General: Normal vulva.     Rectum: Normal.     Comments: External genitalia/pubic area without nits, lice, edema, erythema, lesions and inguinal adenopathy. Vagina with normal mucosa and moderate amount of white discharge, pH=>4.5. Cervix and uterus surgically absent. Musculoskeletal:     Cervical back: Neck supple. No tenderness.  Skin:    General: Skin is warm and dry.     Findings: No bruising, erythema, lesion or rash.  Neurological:     Mental Status: She is alert and oriented to person, place, and time.  Psychiatric:        Mood and Affect: Mood normal.        Thought Content: Thought content normal.        Judgment: Judgment normal.      Assessment and Plan:  Kelly [name] is a 49 y.o. female presenting to the [facility] Department for STI screening  1. Screening for STD (sexually transmitted disease) Patient into clinic with symptoms. Reviewed wet mount results with patient. Counseled that she should wait about 2-3 weeks before getting a rescreening for GC. Rec condoms with all sex. Await test results.  Counseled that RN will call if needs to RTC for treatment once results are back. - WET  PREP FOR TRICH, YEAST, CLUE - HIV [place] [facility] - Syphilis Serology,  Lab  2. BV (bacterial vaginosis) Will treat BV with Metronidazole 500 mg #14 1 po BID for 7 days with food, no EtOH for 24 hr before and until 72 hr after completing medicine. No sex for 10 days. Enc to use OTC antifungal cream if has itching during or just after completing antibiotic. - metroNIDAZOLE (FLAGYL) 500 MG tablet; Take 1 tablet (500 mg total) by mouth 2 (two) times daily for 7 days.  Dispense: 14 tablet; Refill: 0     No  follow-ups on file.  Future Appointments  Date Time Provider Department Center  07/28/2020  1:20 PM Kelly Berry, PA-C [facility]    Kelly [name], [place]

## 2020-07-28 ENCOUNTER — Other Ambulatory Visit: Payer: Self-pay

## 2020-07-28 ENCOUNTER — Encounter: Payer: Self-pay | Admitting: Family Medicine

## 2020-07-28 ENCOUNTER — Ambulatory Visit (INDEPENDENT_AMBULATORY_CARE_PROVIDER_SITE_OTHER): Payer: Medicaid Other | Admitting: Family Medicine

## 2020-07-28 ENCOUNTER — Other Ambulatory Visit (HOSPITAL_COMMUNITY)
Admission: RE | Admit: 2020-07-28 | Discharge: 2020-07-28 | Disposition: A | Discharge status: Home / Self Care | Payer: Medicaid Other | Source: Ambulatory Visit | Attending: Family Medicine | Admitting: Family Medicine

## 2020-07-28 VITALS — BP 118/76 | HR 96 | Temp 97.9°F | Resp 16 | Ht 64.0 in | Wt 189.3 lb

## 2020-07-28 DIAGNOSIS — Z202 Contact with and (suspected) exposure to infections with a predominantly sexual mode of transmission: Secondary | ICD-10-CM | POA: Diagnosis present

## 2020-07-28 DIAGNOSIS — Z0000 Encounter for general adult medical examination without abnormal findings: Secondary | ICD-10-CM | POA: Diagnosis not present

## 2020-07-28 DIAGNOSIS — E1165 Type 2 diabetes mellitus with hyperglycemia: Secondary | ICD-10-CM

## 2020-07-28 DIAGNOSIS — Z1231 Encounter for screening mammogram for malignant neoplasm of breast: Secondary | ICD-10-CM | POA: Diagnosis not present

## 2020-07-28 DIAGNOSIS — Z5181 Encounter for therapeutic drug level monitoring: Secondary | ICD-10-CM | POA: Diagnosis not present

## 2020-07-28 DIAGNOSIS — I10 Essential (primary) hypertension: Secondary | ICD-10-CM

## 2020-07-28 DIAGNOSIS — A549 Gonococcal infection, unspecified: Secondary | ICD-10-CM | POA: Diagnosis not present

## 2020-07-28 DIAGNOSIS — B182 Chronic viral hepatitis C: Secondary | ICD-10-CM

## 2020-07-28 DIAGNOSIS — F324 Major depressive disorder, single episode, in partial remission: Secondary | ICD-10-CM

## 2020-07-28 DIAGNOSIS — Z794 Long term (current) use of insulin: Secondary | ICD-10-CM

## 2020-07-28 NOTE — Progress Notes (Signed)
Patient: Kelly [name], Female    DOB: [birth year], 49 y.o.   MRN: [id number] Kelly Grana, PA-C Visit Date: 07/28/2020  Today's Provider: Delsa Grana, PA-C   Chief Complaint  Patient presents with  . Annual Exam   Subjective:   Annual physical exam:  Kelly [name] is a 49 y.o. female who presents today for complete physical exam:  Exercise/Activity: Walking a lot for her job 5 days a week Diet/nutrition: She is cut out sweets and sodas drinking more water Sleep: Sleeps well  DM -patient was referred to [facility] endocrinology she went 1 time last year in December and was then lost to follow-up- due for A1C, DM foot exam and DM eye exam Lab Results  Component Value Date   HGBA1C 12.2 (H) 06/15/2019  at [facility] last A1C was  09/18/2019  10.4 Per Nephrology    03/30/2020  13.3 She states she is doing NovoLog 6 units 3 times a day, Levemir 25 units once a day but she is not quite sure about that dose  Recently had BV, yeast vaginitis and gonorrhea symptoms did improve after being treated she has not been sexually active again without partner or any other new partners  Her mother last year was diagnosed with breast cancer and had her left breast removed she does not know details about the diagnosis or any genetic testing information.   USPSTF grade A and B recommendations - reviewed and addressed today  Depression:  Phq 9 completed today by patient, was reviewed by me with patient in the room PHQ score is neg, pt feels good - treated for anxiety with vistaril prn PHQ 2/9 Scores 07/28/2020 06/04/2020 12/24/2019 12/13/2019  PHQ - 2 Score 1 0 0 0  PHQ- 9 Score - - - 0   Depression screen [facility] 2/9 07/28/2020 06/04/2020 12/24/2019 12/13/2019 12/04/2019  Decreased Interest 0 0 0 0 0  Down, Depressed, Hopeless 1 0 0 0 0  PHQ - 2 Score 1 0 0 0 0  Altered sleeping - - - 0 -  Tired, decreased energy - - - 0 -  Change in appetite - - - 0 -  Feeling bad or failure about yourself  - - -  0 -  Trouble concentrating - - - 0 -  Moving slowly or fidgety/restless - - - 0 -  Suicidal thoughts - - - 0 -  PHQ-9 Score - - - 0 -  Difficult doing work/chores - - - Not difficult at all -    Alcohol screening:   Office Visit from 07/28/2020 in [facility]  AUDIT-C Score 0      Immunizations and Health Maintenance: Health Maintenance  Topic Date Due  . OPHTHALMOLOGY EXAM  Never done  . HEMOGLOBIN A1C  12/13/2019  . COVID-19 Vaccine (1) 08/13/2020 (Originally 11/16/1982)  . FOOT EXAM  08/21/2020  . TETANUS/TDAP  08/21/2029  . INFLUENZA VACCINE  Completed  . PNEUMOCOCCAL POLYSACCHARIDE VACCINE AGE 69-64 HIGH RISK  Completed  . Hepatitis C Screening  Completed  . HIV Screening  Completed  . PAP SMEAR-Modifier  Discontinued     Hep C Screening: done  STD testing and prevention (HIV/chl/gon/syphilis):  Recent gonorrhea - retest  Intimate partner violence:  Denies, feels safe  Sexual History/Pain during Intercourse: Single, no sexual activity since last encounter and exposure to STD   Menstrual History/LMP/Abnormal Bleeding: no AUB No LMP recorded. Patient has had a hysterectomy.  Incontinence Symptoms: none  Breast cancer: due  for mammogram Last Mammogram: *see HM list above BRCA gene screening: unknown MOM just dx with breast CA  Cervical cancer screening: n/a  Pt denies andy known family hx of cancers - ovarian, uterine, colon:     Osteoporosis:   Discussion on osteoporosis per age, including high calcium and vitamin D supplementation, weight bearing exercises Not taking any supplements, surgery about 10 years ago  Skin cancer:  Hx of skin CA -  NO Discussed atypical lesions   Colorectal cancer:   Colonoscopy is UTD - seeing GI   Discussed concerning signs and sx of CRC, pt denies change of bowel movements, melena, hematochezia  Lung cancer:   Low Dose CT Chest recommended if Age 55-80 years, 30 pack-year currently smoking OR have quit  w/in 15years. Patient does not qualify.    Social History   Tobacco Use  . Smoking status: Never Smoker  . Smokeless tobacco: Never Used  Vaping Use  . Vaping Use: Never used  Substance Use Topics  . Alcohol use: Yes    Comment: Socially   . Drug use: No       [facility] Visit from 07/28/2020 in [facility]  AUDIT-C Score 0      Family History  Problem Relation Age of Onset  . Breast cancer Mother      Blood pressure/Hypertension: BP Readings from Last 3 Encounters:  07/28/20 118/76  06/04/20 130/76  04/09/20 110/73    Weight/Obesity: Wt Readings from Last 3 Encounters:  07/28/20 189 lb 4.8 oz (85.9 kg)  06/04/20 197 lb 3.2 oz (89.4 kg)  04/09/20 180 lb (81.6 kg)   BMI Readings from Last 3 Encounters:  07/28/20 32.49 kg/m  06/04/20 33.85 kg/m  04/09/20 29.05 kg/m     Lipids:  Lab Results  Component Value Date   CHOL 137 07/26/2019   CHOL 149 06/15/2019   CHOL 143 07/12/2017   Lab Results  Component Value Date   HDL 41 (L) 07/26/2019   HDL 36 (L) 06/15/2019   HDL 35 (L) 07/12/2017   Lab Results  Component Value Date   LDLCALC 70 07/26/2019   LDLCALC 76 06/15/2019   LDLCALC 84 07/12/2017   Lab Results  Component Value Date   TRIG 187 (H) 07/26/2019   TRIG 185 (H) 06/15/2019   TRIG 121 07/12/2017   Lab Results  Component Value Date   CHOLHDL 3.3 07/26/2019   CHOLHDL 4.1 06/15/2019   CHOLHDL 4.1 07/12/2017   No results found for: LDLDIRECT Based on the results of lipid panel his/her cardiovascular risk factor ( using [place] )  in the next 10 years is: The ASCVD Risk score Mikey Bussing DC Jr., et al., 2013) failed to calculate for the following reasons:   The patient has a prior MI or stroke diagnosis Glucose:  Glucose  Date Value Ref Range Status  01/01/2015 146 (H) mg/dL Final    Comment:    65-99 NOTE: New Reference Range  11/25/14   04/29/2014 150 (H) 65 - 99 mg/dL Final  05/29/2013 144 (H) 65 - 99 mg/dL Final    Glucose, Bld  Date Value Ref Range Status  04/09/2020 310 (H) 70 - 99 mg/dL Final    Comment:    Glucose reference range applies only to samples taken after fasting for at least 8 hours.  08/22/2019 316 (H) 65 - 99 mg/dL Final    Comment:    .            Fasting  reference interval . For someone without known diabetes, a glucose value >125 mg/dL indicates that they may have diabetes and this should be confirmed with a follow-up test. .   07/26/2019 257 (H) 65 - 99 mg/dL Final    Comment:    .            Fasting reference interval . For someone without known diabetes, a glucose value >125 mg/dL indicates that they may have diabetes and this should be confirmed with a follow-up test. .    Glucose-Capillary  Date Value Ref Range Status  06/15/2019 189 (H) 70 - 99 mg/dL Final  06/15/2019 255 (H) 70 - 99 mg/dL Final  06/14/2019 297 (H) 70 - 99 mg/dL Final   Hypertension: BP Readings from Last 3 Encounters:  07/28/20 118/76  06/04/20 130/76  04/09/20 110/73   Obesity: Wt Readings from Last 3 Encounters:  07/28/20 189 lb 4.8 oz (85.9 kg)  06/04/20 197 lb 3.2 oz (89.4 kg)  04/09/20 180 lb (81.6 kg)   BMI Readings from Last 3 Encounters:  07/28/20 32.49 kg/m  06/04/20 33.85 kg/m  04/09/20 29.05 kg/m     Social History      She        Social History   Socioeconomic History  . Marital status: Single    Spouse name: Not on file  . Number of children: 1  . Years of education: Not on file  . Highest education level: Not on file  Occupational History  . Occupation: Water engineer    Comment: cleaning  Tobacco Use  . Smoking status: Never Smoker  . Smokeless tobacco: Never Used  Vaping Use  . Vaping Use: Never used  Substance and Sexual Activity  . Alcohol use: Yes    Comment: Socially   . Drug use: No  . Sexual activity: Yes    Partners: Male    Birth control/protection: Surgical  Other Topics Concern  . Not on file  Social History  Narrative   Daughter lives with patient   Social Determinants of Health   Financial Resource Strain: Low Risk   . Difficulty of Paying Living Expenses: Not hard at all  Food Insecurity: No Food Insecurity  . Worried About Charity fundraiser in the Last Year: Never true  . Ran Out of Food in the Last Year: Never true  Transportation Needs: No Transportation Needs  . Lack of Transportation (Medical): No  . Lack of Transportation (Non-Medical): No  Physical Activity: Sufficiently Active  . Days of Exercise per Week: 5 days  . Minutes of Exercise per Session: 30 min  Stress: No Stress Concern Present  . Feeling of Stress : Not at all  Social Connections: Moderately Isolated  . Frequency of Communication with Friends and Family: More than three times a week  . Frequency of Social Gatherings with Friends and Family: More than three times a week  . Attends Religious Services: 1 to 4 times per year  . Active Member of Clubs or Organizations: No  . Attends Archivist Meetings: Never  . Marital Status: Never married    Family History        Family History  Problem Relation Age of Onset  . Breast cancer Mother     Patient Active Problem List   Diagnosis Date Noted  . Chronic active hepatitis C ([place]) 09/30/2019  . Type 2 diabetes mellitus with hyperglycemia ([place]) 09/30/2019  . Recurrent major depressive episodes, moderate ([place]) 06/15/2019  .  Transient cerebral ischemia 06/14/2019  . Diabetes mellitus without complication ([place]) 43/32/9518  . Hypertension 07/11/2017  . Anxiety 01/19/2012    Past Surgical History:  Procedure Laterality Date  . ABDOMINAL HYSTERECTOMY    . GALLBLADDER SURGERY       Current Outpatient Medications:  .  aspirin 81 MG chewable tablet, Chew by mouth daily., Disp: , Rfl:  .  Blood Glucose Monitoring Suppl (ACCU-CHEK GUIDE ME) w/Device KIT, Check BG TID LON:99, DX:E11.65, Disp: 1 kit, Rfl: 0 .  hydrochlorothiazide (HYDRODIURIL) 12.5 MG  tablet, Take 1 tablet (12.5 mg total) by mouth daily., Disp: 90 tablet, Rfl: 3 .  hydrOXYzine (ATARAX/VISTARIL) 10 MG tablet, TAKE 1 TABLET (10 MG TOTAL) BY MOUTH EVERY 8 (EIGHT) HOURS AS NEEDED FOR ANXIETY., Disp: 270 tablet, Rfl: 1 .  insulin aspart (NOVOLOG) 100 UNIT/ML FlexPen, Inject 6 Units into the skin 3 (three) times daily with meals., Disp: 15 mL, Rfl: 11 .  Insulin Detemir (LEVEMIR) 100 UNIT/ML Pen, Inject 24-50 Units into the skin daily., Disp: 15 mL, Rfl: 3 .  liraglutide (VICTOZA) 18 MG/3ML SOPN, Inject 0.66m once daily for 7 days, then increase to 1.230monce daily., Disp: 9 mL, Rfl: 3 .  lisinopril (ZESTRIL) 40 MG tablet, Take 1 tablet (40 mg total) by mouth daily., Disp: 90 tablet, Rfl: 3 .  Accu-Chek Softclix Lancets lancets, Check BG TID DX:E11.65, LON:99 (Patient not taking: Reported on 07/28/2020), Disp: 100 each, Rfl: 12 .  fluconazole (DIFLUCAN) 150 MG tablet, Take 1 tablet (150 mg total) by mouth every 3 (three) days as needed (for vaginal itching/yeast infection sx). (Patient not taking: Reported on 07/28/2020), Disp: 2 tablet, Rfl: 0 .  Glecaprevir-Pibrentasvir (MAVYRET) 100-40 MG TABS, Take 3 tablets by mouth daily with breakfast. (Patient not taking: Reported on 07/28/2020), Disp: 84 tablet, Rfl: 1 .  glucose blood (ACCU-CHEK GUIDE) test strip, Dx:E11.65, LON:99 check BG tid (Patient not taking: Reported on 07/28/2020), Disp: 100 each, Rfl: 12  No Known Allergies  Patient Care Team: TaDelsa GranaPA-C as PCP - General (Family Medicine) McNeldon LabellaRN as Case Manager HeCathi RoanRPMusc Health Chester Medical CenterPharmacist)  Review of Systems  10 Systems reviewed and are negative for acute change except as noted in the HPI.   I personally reviewed active problem list, medication list, allergies, family history, social history, health maintenance, notes from last encounter, lab results, imaging with the patient/caregiver today.        Objective:   Vitals:  Vitals:   07/28/20 1328   BP: 118/76  Pulse: 96  Resp: 16  Temp: 97.9 F (36.6 C)  TempSrc: Oral  SpO2: 98%  Weight: 189 lb 4.8 oz (85.9 kg)  Height: _0  (1.626 m)    Body mass index is 32.49 kg/m.  Physical Exam Vitals and nursing note reviewed.  Constitutional:      General: She is not in acute distress.    Appearance: Normal appearance. She is well-developed. She is obese. She is not ill-appearing, toxic-appearing or diaphoretic.     Interventions: Face mask in place.  HENT:     Head: Normocephalic and atraumatic.     Right Ear: External ear normal.     Left Ear: External ear normal.  Eyes:     General: Lids are normal. No scleral icterus.       Right eye: No discharge.        Left eye: No discharge.     Conjunctiva/sclera: Conjunctivae normal.  Neck:     Thyroid:  No thyroid mass, thyromegaly or thyroid tenderness.     Trachea: Phonation normal. No tracheal deviation.  Cardiovascular:     Rate and Rhythm: Normal rate and regular rhythm.     Pulses: Normal pulses.          Radial pulses are 2+ on the right side and 2+ on the left side.       Posterior tibial pulses are 2+ on the right side and 2+ on the left side.     Heart sounds: Normal heart sounds. No murmur heard.  No friction rub. No gallop.   Pulmonary:     Effort: Pulmonary effort is normal. No respiratory distress.     Breath sounds: Normal breath sounds. No stridor. No wheezing, rhonchi or rales.  Chest:     Chest wall: No tenderness.  Abdominal:     General: Bowel sounds are normal. There is no distension.     Palpations: Abdomen is soft.     Tenderness: There is no abdominal tenderness. There is no right CVA tenderness, left CVA tenderness or guarding.  Musculoskeletal:     Right lower leg: No edema.     Left lower leg: No edema.  Lymphadenopathy:     Cervical: No cervical adenopathy.  Skin:    General: Skin is warm and dry.     Capillary Refill: Capillary refill takes less than 2 seconds.     Coloration: Skin is not  jaundiced or pale.     Findings: No rash.  Neurological:     Mental Status: She is alert. Mental status is at baseline.     Motor: No abnormal muscle tone.     Gait: Gait normal.  Psychiatric:        Mood and Affect: Mood normal.        Speech: Speech normal.        Behavior: Behavior normal.       Fall Risk: Fall Risk  07/28/2020 06/04/2020 12/13/2019 08/22/2019 08/21/2019  Falls in the past year? 0 0 0 0 0  Number falls in past yr: 0 0 0 0 -  Injury with Fall? 0 0 0 0 -  Risk for fall due to : - - - - Other (Comment)  Risk for fall due to: Comment - - - - Low Risk/Discussed possible medication side effects.  Follow up Falls evaluation completed Falls evaluation completed - Falls evaluation completed Falls prevention discussed    Functional Status Survey: Is the patient deaf or have difficulty hearing?: No Does the patient have difficulty seeing, even when wearing glasses/contacts?: No Does the patient have difficulty concentrating, remembering, or making decisions?: No Does the patient have difficulty walking or climbing stairs?: No Does the patient have difficulty dressing or bathing?: No Does the patient have difficulty doing errands alone such as visiting a doctor's office or shopping?: No   Assessment & Plan:    CPE completed today  . USPSTF grade A and B recommendations reviewed with patient; age-appropriate recommendations, preventive care, screening tests, etc discussed and encouraged; healthy living encouraged; see AVS for patient education given to patient  . Discussed importance of 150 minutes of physical activity weekly, AHA exercise recommendations given to pt in AVS/handout  . Discussed importance of healthy diet:  eating lean meats and proteins, avoiding trans fats and saturated fats, avoid simple sugars and excessive carbs in diet, eat 6 servings of fruit/vegetables daily and drink plenty of water and avoid sweet beverages.    . Recommended pt  to do annual eye  exam and routine dental exams/cleanings  . Depression, alcohol, fall screening completed as documented above and per flowsheets  . Reviewed Health Maintenance: Health Maintenance  Topic Date Due  . OPHTHALMOLOGY EXAM  Never done  . HEMOGLOBIN A1C  12/13/2019  . COVID-19 Vaccine (1) 08/13/2020 (Originally [birth year]1983)  . FOOT EXAM  08/21/2020  . TETANUS/TDAP  08/21/2029  . INFLUENZA VACCINE  Completed  . PNEUMOCOCCAL POLYSACCHARIDE VACCINE AGE 68-64 HIGH RISK  Completed  . Hepatitis C Screening  Completed  . HIV Screening  Completed  . PAP SMEAR-Modifier  Discontinued    . Immunizations: Immunization History  Administered Date(s) Administered  . Influenza,inj,Quad PF,6+ Mos 07/16/2017, 06/15/2019, 06/05/2020  . Pneumococcal Polysaccharide-23 08/22/2019  . Tdap 08/22/2019      ICD-10-CM   1. Adult general medical exam  Z00.00 CBC with Differential/Platelet    COMPLETE METABOLIC PANEL WITH GFR    Lipid panel  2. Encounter for medication monitoring  Z51.81 CBC with Differential/Platelet    COMPLETE METABOLIC PANEL WITH GFR    Lipid panel    Hemoglobin A1c  3. Encounter for screening mammogram for malignant neoplasm of breast  Z12.31 MM 3D SCREEN BREAST BILATERAL   Mammogram ordered patient given information on how to schedule  4. Gonorrhea - recheck  A54.9 Cervicovaginal ancillary only  5. Exposure to sexually transmitted disease (STD) - recheck    Other conditions below - labs today but pt needs f/up appt to address: Z20.2 Cervicovaginal ancillary only  6. Chronic active hepatitis C ([place])  B18.2    completed tx and she has f/up with GI   7. Hypertension, unspecified type  Q02 COMPLETE METABOLIC PANEL WITH GFR  8. Type 2 diabetes mellitus with hyperglycemia, with long-term current use of insulin (HCC)  I28.54 COMPLETE METABOLIC PANEL WITH GFR   Z79.4 Hemoglobin A1c   Patient is on long-acting and mealtime insulin has not followed up for management of her insulin-dependent  diabetes and almost a year  68. Major depressive disorder in partial remission, unspecified whether recurrent (HCC)  F32.4    PHQ-9 and GAD-7 reviewed today, moods good, screening negative       Kelly Grana, PA-C 07/28/20 1:51 PM  [facility]

## 2020-07-28 NOTE — Patient Instructions (Addendum)
[facility] at [facility] [place],  [place]  [id number] Get Driving Directions Main: [phone]  Health Maintenance  Topic Date Due  . Eye exam for diabetics  Never done  . Mammogram  Never done  . Hemoglobin A1C  12/13/2019  . COVID-19 Vaccine (1) 08/13/2020*  . Complete foot exam   08/21/2020  . Tetanus Vaccine  08/21/2029  . Flu Shot  Completed  . Pneumococcal vaccine  Completed  .  Hepatitis C: One time screening is recommended by Center for Disease Control  (CDC) for  adults born from [birth year] through 1964.   Completed  . HIV Screening  Completed  . Pap Smear  Discontinued  *Topic was postponed. The date shown is not the original due date.      Preventive Care 48-42 Years Old, Female Preventive care refers to visits with your health care provider and lifestyle choices that can promote health and wellness. This includes:  A yearly physical exam. This may also be called an annual well check.  Regular dental visits and eye exams.  Immunizations.  Screening for certain conditions.  Healthy lifestyle choices, such as eating a healthy diet, getting regular exercise, not using drugs or products that contain nicotine and tobacco, and limiting alcohol use. What can I expect for my preventive care visit? Physical exam Your health care provider will check your:  Height and weight. This may be used to calculate body mass index (BMI), which tells if you are at a healthy weight.  Heart rate and blood pressure.  Skin for abnormal spots. Counseling Your health care provider may ask you questions about your:  Alcohol, tobacco, and drug use.  Emotional well-being.  Home and relationship well-being.  Sexual activity.  Eating habits.  Work and work Statistician.  Method of birth control.  Menstrual cycle.  Pregnancy history. What immunizations do I need?  Influenza (flu) vaccine  This is recommended every year. Tetanus,  diphtheria, and pertussis (Tdap) vaccine  You may need a Td booster every 10 years. Varicella (chickenpox) vaccine  You may need this if you have not been vaccinated. Zoster (shingles) vaccine  You may need this after age 30. Measles, mumps, and rubella (MMR) vaccine  You may need at least one dose of MMR if you were born in 1957 or later. You may also need a second dose. Pneumococcal conjugate (PCV13) vaccine  You may need this if you have certain conditions and were not previously vaccinated. Pneumococcal polysaccharide (PPSV23) vaccine  You may need one or two doses if you smoke cigarettes or if you have certain conditions. Meningococcal conjugate (MenACWY) vaccine  You may need this if you have certain conditions. Hepatitis A vaccine  You may need this if you have certain conditions or if you travel or work in places where you may be exposed to hepatitis A. Hepatitis B vaccine  You may need this if you have certain conditions or if you travel or work in places where you may be exposed to hepatitis B. Haemophilus influenzae type b (Hib) vaccine  You may need this if you have certain conditions. Human papillomavirus (HPV) vaccine  If recommended by your health care provider, you may need three doses over 6 months. You may receive vaccines as individual doses or as more than one vaccine together in one shot (combination vaccines). Talk with your health care provider about the risks and benefits of combination vaccines. What tests do I need? Blood tests  Lipid and  cholesterol levels. These may be checked every 5 years, or more frequently if you are over 48 years old.  Hepatitis C test.  Hepatitis B test. Screening  Lung cancer screening. You may have this screening every year starting at age 48 if you have a 30-pack-year history of smoking and currently smoke or have quit within the past 15 years.  Colorectal cancer screening. All adults should have this screening  starting at age 48 and continuing until age 48. Your health care provider may recommend screening at age 48 if you are at increased risk. You will have tests every 1-10 years, depending on your results and the type of screening test.  Diabetes screening. This is done by checking your blood sugar (glucose) after you have not eaten for a while (fasting). You may have this done every 1-3 years.  Mammogram. This may be done every 1-2 years. Talk with your health care provider about when you should start having regular mammograms. This may depend on whether you have a family history of breast cancer.  BRCA-related cancer screening. This may be done if you have a family history of breast, ovarian, tubal, or peritoneal cancers.  Pelvic exam and Pap test. This may be done every 3 years starting at age 48. Starting at age 25, this may be done every 5 years if you have a Pap test in combination with an HPV test. Other tests  Sexually transmitted disease (STD) testing.  Bone density scan. This is done to screen for osteoporosis. You may have this scan if you are at high risk for osteoporosis. Follow these instructions at home: Eating and drinking  Eat a diet that includes fresh fruits and vegetables, whole grains, lean protein, and low-fat dairy.  Take vitamin and mineral supplements as recommended by your health care provider.  Do not drink alcohol if: ? Your health care provider tells you not to drink. ? You are pregnant, may be pregnant, or are planning to become pregnant.  If you drink alcohol: ? Limit how much you have to 0-1 drink a day. ? Be aware of how much alcohol is in your drink. In the [place] one drink equals one 12 oz bottle of beer (355 mL), one 5 oz glass of wine (148 mL), or one 1 oz glass of hard liquor (44 mL). Lifestyle  Take daily care of your teeth and gums.  Stay active. Exercise for at least 30 minutes on 5 or more days each week.  Do not use any products that contain  nicotine or tobacco, such as cigarettes, e-cigarettes, and chewing tobacco. If you need help quitting, ask your health care provider.  If you are sexually active, practice safe sex. Use a condom or other form of birth control (contraception) in order to prevent pregnancy and STIs (sexually transmitted infections).  If told by your health care provider, take low-dose aspirin daily starting at age 48. What's next?  Visit your health care provider once a year for a well check visit.  Ask your health care provider how often you should have your eyes and teeth checked.  Stay up to date on all vaccines. This information is not intended to replace advice given to you by your health care provider. Make sure you discuss any questions you have with your health care provider. Document Revised: 05/17/2018 Document Reviewed: 05/17/2018 Elsevier Patient Education  2020 [place].    Preventing Osteoporosis, Adult Osteoporosis is a condition that causes the bones to lose density. This means  that the bones become thinner, and the normal spaces in bone tissue become larger. Low bone density can make the bones weak and cause them to break more easily. Osteoporosis cannot always be prevented, but you can take steps to lower your risk of developing this condition. How can this condition affect me? If you develop osteoporosis, you will be more likely to break bones in your wrist, spine, or hip. Even a minor accident or injury can be enough to break weak bones. The bones will also be slower to heal. Osteoporosis can cause other problems as well, such as a stooped posture or trouble with movement. Osteoporosis can occur with aging. As you get older, you may lose bone tissue more quickly, or it may be replaced more slowly. Osteoporosis is more likely to develop if you have poor nutrition or do not get enough calcium or vitamin D. Other lifestyle factors can also play a role. By eating a well-balanced diet and  making lifestyle changes, you can help keep your bones strong and healthy, lowering your chances of developing osteoporosis. What can increase my risk? The following factors may make you more likely to develop osteoporosis:  Having a family history of the condition.  Having poor nutrition or not getting enough calcium or vitamin D.  Using certain medicines, such as steroid medicines or antiseizure medicines.  Being any of the following: ? 20 years of age or older. ? Female. ? A woman who has gone through menopause (is postmenopausal). ? White (Caucasian) or of Asian descent.  Smoking or having a history of smoking.  Not being physically active (being sedentary).  Having a small body frame. What actions can I take to prevent this?  Get enough calcium   Make sure you get enough calcium every day. Calcium is the most important mineral for bone health. Most people can get enough calcium from their diet, but supplements may be recommended for people who are at risk for osteoporosis. Follow these guidelines: ? If you are age 85 or younger, aim to get 1,000 mg of calcium every day. ? If you are older than age 73, aim to get 1,200 mg of calcium every day.  Good sources of calcium include: ? Dairy products, such as low-fat or nonfat milk, cheese, and yogurt. ? Dark green leafy vegetables, such as bok choy and broccoli. ? Foods that have had calcium added to them (calcium-fortified foods), such as orange juice, cereal, bread, soy beverages, and tofu products. ? Nuts, such as almonds.  Check nutrition labels to see how much calcium is in a food or drink. Get enough vitamin D  Try to get enough vitamin D every day. Vitamin D is the most essential vitamin for bone health. It helps the body absorb calcium. Follow these guidelines for how much vitamin D to get from food: ? If you are age 20 or younger, aim to get at least 600 international units (IU) every day. Your health care provider may  suggest more. ? If you are older than age 33, aim to get at least 800 international units every day. Your health care provider may suggest more.  Good sources of vitamin D in your diet include: ? Egg yolks. ? Oily fish, such as salmon, sardines, and tuna. ? Milk and cereal fortified with vitamin D.  Your body also makes vitamin D when you are out in the sun. Exposing the bare skin on your face, arms, legs, or back to the sun for no more than  30 minutes a day, 2 times a week is more than enough. Beyond that, make sure you use sunblock to protect your skin from sunburn, which increases your risk for skin cancer. Exercise  Stay active and get exercise every day.  Ask your health care provider what types of exercise are best for you. Weight-bearing and strength-building activities are important for building and maintaining healthy bones. Some examples of these types of activities include: ? Walking and hiking. ? Jogging and running. ? Dancing. ? Gym exercises. ? Lifting weights. ? Tennis and racquetball. ? Climbing stairs. ? Aerobics. Make other lifestyle changes  Do not use any products that contain nicotine or tobacco, such as cigarettes, e-cigarettes, and chewing tobacco. If you need help quitting, ask your health care provider.  Lose weight if you are overweight.  If you drink alcohol: ? Limit how much you use to:  0-1 drink a day for nonpregnant women.  0-2 drinks a day for men. ? Be aware of how much alcohol is in your drink. In the [place]., one drink equals one 12 oz bottle of beer (355 mL), one 5 oz glass of wine (148 mL), or one 1 oz glass of hard liquor (44 mL). Where to find support If you need help making changes to prevent osteoporosis, talk with your health care provider. You can ask for a referral to a diet and nutrition specialist (dietitian) and a physical therapist. Where to find more information Learn more about osteoporosis from:  NIH Osteoporosis and Related  [place]: www.bones.SouthExposed.es  [place].S. Office on [employer] Health: [place]s.tn  [place]: EquipmentWeekly.com.ee Summary  Osteoporosis is a condition that causes weak bones that are more likely to break.  Eat a healthy diet, making sure you get enough calcium and vitamin D, and stay active by getting regular exercise to help prevent osteoporosis.  Other ways to reduce your risk of osteoporosis include maintaining a healthy weight and avoiding alcohol and products that contain nicotine or tobacco. This information is not intended to replace advice given to you by your health care provider. Make sure you discuss any questions you have with your health care provider. Document Revised: 04/05/2019 Document Reviewed: 04/05/2019 Elsevier Patient Education  [place].

## 2020-07-29 ENCOUNTER — Other Ambulatory Visit: Payer: Self-pay | Admitting: Family Medicine

## 2020-07-29 LAB — CBC WITH DIFFERENTIAL/PLATELET
Absolute Monocytes: 448 cells/uL (ref 200–950)
Basophils Absolute: 30 cells/uL (ref 0–200)
Basophils Relative: 0.5 %
Eosinophils Absolute: 248 cells/uL (ref 15–500)
Eosinophils Relative: 4.2 %
HCT: 38.6 % (ref 35.0–45.0)
Hemoglobin: 12.8 g/dL (ref 11.7–15.5)
Lymphs Abs: 2071 cells/uL (ref 850–3900)
MCH: 27.7 pg (ref 27.0–33.0)
MCHC: 33.2 g/dL (ref 32.0–36.0)
MCV: 83.5 fL (ref 80.0–100.0)
MPV: 11.9 fL (ref 7.5–12.5)
Monocytes Relative: 7.6 %
Neutro Abs: 3103 cells/uL (ref 1500–7800)
Neutrophils Relative %: 52.6 %
Platelets: 219 10*3/uL (ref 140–400)
RBC: 4.62 10*6/uL (ref 3.80–5.10)
RDW: 12.2 % (ref 11.0–15.0)
Total Lymphocyte: 35.1 %
WBC: 5.9 10*3/uL (ref 3.8–10.8)

## 2020-07-29 LAB — COMPLETE METABOLIC PANEL WITH GFR
AG Ratio: 1.3 (calc) (ref 1.0–2.5)
ALT: 9 U/L (ref 6–29)
AST: 10 U/L (ref 10–35)
Albumin: 4.1 g/dL (ref 3.6–5.1)
Alkaline phosphatase (APISO): 58 U/L (ref 31–125)
BUN: 9 mg/dL (ref 7–25)
CO2: 28 mmol/L (ref 20–32)
Calcium: 9.7 mg/dL (ref 8.6–10.2)
Chloride: 103 mmol/L (ref 98–110)
Creat: 0.89 mg/dL (ref 0.50–1.10)
GFR, Est African American: 89 mL/min/{1.73_m2} (ref >=60)
GFR, Est Non African American: 77 mL/min/{1.73_m2} (ref >=60)
Globulin: 3.2 g/dL (calc) (ref 1.9–3.7)
Glucose, Bld: 315 mg/dL — ABNORMAL HIGH (ref 65–99)
Potassium: 3.9 mmol/L (ref 3.5–5.3)
Sodium: 139 mmol/L (ref 135–146)
Total Bilirubin: 0.3 mg/dL (ref 0.2–1.2)
Total Protein: 7.3 g/dL (ref 6.1–8.1)

## 2020-07-29 LAB — LIPID PANEL
Cholesterol: 189 mg/dL (ref <200)
HDL: 42 mg/dL — ABNORMAL LOW (ref >=50)
LDL Cholesterol (Calc): 114 mg/dL (calc) — ABNORMAL HIGH
Non-HDL Cholesterol (Calc): 147 mg/dL (calc) — ABNORMAL HIGH (ref <130)
Total CHOL/HDL Ratio: 4.5 (calc) (ref <5.0)
Triglycerides: 213 mg/dL — ABNORMAL HIGH (ref <150)

## 2020-07-29 LAB — HEMOGLOBIN A1C
Hgb A1c MFr Bld: 11.5 % of total Hgb — ABNORMAL HIGH (ref <5.7)
Mean Plasma Glucose: 283 (calc)
eAG (mmol/L): 15.7 (calc)

## 2020-07-29 MED ORDER — ROSUVASTATIN CALCIUM 10 MG PO TABS: 10.0000 mg | ORAL_TABLET | Freq: Every day | ORAL | 3 refills | Status: DC

## 2020-07-30 LAB — CERVICOVAGINAL ANCILLARY ONLY
Bacterial Vaginitis (gardnerella): POSITIVE — AB
Candida Glabrata: NEGATIVE
Candida Vaginitis: POSITIVE — AB
Chlamydia: NEGATIVE
Comment: NEGATIVE
Comment: NEGATIVE
Comment: NEGATIVE
Comment: NEGATIVE
Comment: NEGATIVE
Comment: NORMAL
Neisseria Gonorrhea: NEGATIVE
Trichomonas: NEGATIVE

## 2020-08-04 ENCOUNTER — Ambulatory Visit: Payer: Self-pay

## 2020-08-04 NOTE — Chronic Care Management (AMB) (Signed)
  Chronic Care Management   Note    Name: Monik Lins MRN: [id number] DOB: [birth year]       Update Only:  Goals met on 03/30/20. CCM enrollment updated.      [facility] Care Management [facility] [phone]

## 2020-08-27 ENCOUNTER — Ambulatory Visit: Payer: Medicaid Other | Admitting: Family Medicine

## 2020-11-20 ENCOUNTER — Ambulatory Visit: Payer: Self-pay | Admitting: *Deleted

## 2020-11-20 NOTE — Telephone Encounter (Signed)
Patient is calling to report she is having swelling in R knee.Patient states it is painful and the swelling does not go away with elevation. Patient does not remember injuring the knee and reports no swelling anywhere else in the leg. No appointment available within disposition- first available appointment- Tuesday,3/8. Note sent for review. Reason for Disposition . [1] MODERATE pain (e.g., interferes with normal activities, limping) AND [2] present > 3 days  Answer Assessment - Initial Assessment Questions 1. LOCATION: "Where is the swelling located?"  (e.g., left, right, both knees)     R knee 2. SIZE and DESCRIPTION: "What does the swelling look like?"  (e.g., entire knee, localized)     Swelling in knee- front 3. ONSET: "When did the swelling start?" "Does it come and go, or is it there all the time?"     3 weeks- constant 4. PAIN: "Is there any pain?" If Yes, ask: "How bad is it?" (Scale 1-10; or mild, moderate, severe)     Yes- sharp pain- 8 5. SETTING: "Has there been any recent work, exercise or other activity that involved that part of the body?"      No injury reported 6. AGGRAVATING FACTORS: "What makes the knee swelling worse?" (e.g., walking, climbing stairs, running)     no 7. ASSOCIATED SYMPTOMS: "Is there any pain or redness?"     No discloration 8. OTHER SYMPTOMS: "Do you have any other symptoms?" (e.g., chest pain, difficulty breathing, fever, calf pain)     no 9. PREGNANCY: "Is there any chance you are pregnant?" "When was your last menstrual period?"     n/a  Protocols used: KNEE [facility]

## 2020-11-20 NOTE — Telephone Encounter (Signed)
Spoke to patient the right knee swelling has been going on for 3 weeks. PL 8. No known injury. Patient was informed to got to UC if pain worse or [facility] walk in hours

## 2020-11-23 NOTE — Progress Notes (Deleted)
Name: Kelly [name]   MRN: [id number]    DOB: 05-May-1971   Date:11/23/2020       Progress Note  Subjective  Chief Complaint  Knee Swelling- R, x3 weeks  HPI  *** Patient Active Problem List   Diagnosis Date Noted  . Chronic active hepatitis C ([place]) 09/30/2019  . Type 2 diabetes mellitus with hyperglycemia ([place]) 09/30/2019  . Recurrent major depressive episodes, moderate ([place]) 06/15/2019  . Transient cerebral ischemia 06/14/2019  . Diabetes mellitus without complication ([place]) 54/98/2641  . Hypertension 07/11/2017  . Anxiety 01/19/2012    Past Surgical History:  Procedure Laterality Date  . ABDOMINAL HYSTERECTOMY    . GALLBLADDER SURGERY      Family History  Problem Relation Age of Onset  . Breast cancer Mother     Social History   Tobacco Use  . Smoking status: Never Smoker  . Smokeless tobacco: Never Used  Substance Use Topics  . Alcohol use: Yes    Comment: Socially      Current Outpatient Medications:  .  Accu-Chek Softclix Lancets lancets, Check BG TID DX:E11.65, LON:99 (Patient not taking: Reported on 07/28/2020), Disp: 100 each, Rfl: 12 .  aspirin 81 MG chewable tablet, Chew by mouth daily., Disp: , Rfl:  .  Blood Glucose Monitoring Suppl (ACCU-CHEK GUIDE ME) w/Device KIT, Check BG TID LON:99, DX:E11.65, Disp: 1 kit, Rfl: 0 .  glucose blood (ACCU-CHEK GUIDE) test strip, Dx:E11.65, LON:99 check BG tid (Patient not taking: Reported on 07/28/2020), Disp: 100 each, Rfl: 12 .  hydrochlorothiazide (HYDRODIURIL) 12.5 MG tablet, Take 1 tablet (12.5 mg total) by mouth daily., Disp: 90 tablet, Rfl: 3 .  hydrOXYzine (ATARAX/VISTARIL) 10 MG tablet, TAKE 1 TABLET (10 MG TOTAL) BY MOUTH EVERY 8 (EIGHT) HOURS AS NEEDED FOR ANXIETY., Disp: 270 tablet, Rfl: 1 .  insulin aspart (NOVOLOG) 100 UNIT/ML FlexPen, Inject 6 Units into the skin 3 (three) times daily with meals., Disp: 15 mL, Rfl: 11 .  Insulin Detemir (LEVEMIR) 100 UNIT/ML Pen, Inject 24-50 Units into the skin daily.,  Disp: 15 mL, Rfl: 3 .  liraglutide (VICTOZA) 18 MG/3ML SOPN, Inject 0.37m once daily for 7 days, then increase to 1.263monce daily., Disp: 9 mL, Rfl: 3 .  lisinopril (ZESTRIL) 40 MG tablet, Take 1 tablet (40 mg total) by mouth daily., Disp: 90 tablet, Rfl: 3 .  rosuvastatin (CRESTOR) 10 MG tablet, Take 1 tablet (10 mg total) by mouth at bedtime., Disp: 90 tablet, Rfl: 3  No Known Allergies  I personally reviewed {Reviewed:14835} with the patient/caregiver today.   ROS  ***  Objective  There were no vitals filed for this visit.  There is no height or weight on file to calculate BMI.  Physical Exam ***  No results found for this or any previous visit (from the past 2160 hour(s)).  Diabetic Foot Exam: Diabetic Foot Exam - Simple   No data filed    ***  PHQ2/9[facility] 07/28/2020 06/04/2020 12/24/2019 12/13/2019 12/04/2019  Decreased Interest 0 0 0 0 0  Down, Depressed, Hopeless 1 0 0 0 0  PHQ - 2 Score 1 0 0 0 0  Altered sleeping - - - 0 -  Tired, decreased energy - - - 0 -  Change in appetite - - - 0 -  Feeling bad or failure about yourself  - - - 0 -  Trouble concentrating - - - 0 -  Moving slowly or fidgety/restless - - - 0 -  Suicidal  thoughts - - - 0 -  PHQ-9 Score - - - 0 -  Difficult doing work/chores - - - Not difficult at all -    phq 9 is {gen pos [id number]} ***  Fall Risk: Fall Risk  07/28/2020 06/04/2020 12/13/2019 08/22/2019 08/21/2019  Falls in the past year? 0 0 0 0 0  Number falls in past yr: 0 0 0 0 -  Injury with Fall? 0 0 0 0 -  Risk for fall due to : - - - - Other (Comment)  Risk for fall due to: Comment - - - - Low Risk/Discussed possible medication side effects.  Follow up Falls evaluation completed Falls evaluation completed - Falls evaluation completed Falls prevention discussed   ***   Functional Status Survey:   ***   Assessment & Plan  *** There are no diagnoses linked to this encounter.

## 2020-11-24 ENCOUNTER — Ambulatory Visit: Payer: Self-pay | Admitting: Family Medicine

## 2021-01-13 ENCOUNTER — Other Ambulatory Visit: Payer: Self-pay | Admitting: Family Medicine

## 2021-01-13 DIAGNOSIS — I10 Essential (primary) hypertension: Secondary | ICD-10-CM

## 2021-01-14 ENCOUNTER — Other Ambulatory Visit: Payer: Self-pay | Admitting: Family Medicine

## 2021-01-14 DIAGNOSIS — I10 Essential (primary) hypertension: Secondary | ICD-10-CM

## 2021-01-14 MED ORDER — LISINOPRIL 40 MG PO TABS: 40.0000 mg | ORAL_TABLET | Freq: Every day | ORAL | 1 refills | Status: DC

## 2021-01-14 NOTE — Telephone Encounter (Signed)
lvm to schedule pt an appt. Prescription was denied until seen

## 2021-01-14 NOTE — Telephone Encounter (Signed)
Pt needs appt

## 2021-05-19 ENCOUNTER — Ambulatory Visit: Payer: Medicaid Other | Admitting: Family Medicine

## 2021-07-30 ENCOUNTER — Encounter: Payer: Medicaid Other | Admitting: Nurse Practitioner

## 2021-07-30 ENCOUNTER — Encounter: Payer: Medicaid Other | Admitting: Internal Medicine

## 2021-07-30 NOTE — Progress Notes (Deleted)
Name: Kelly [name]   MRN: [id number]    DOB: [birth year]   Date:07/30/2021       Progress Note  Subjective  Chief Complaint  physical   HPI  Patient presents for annual CPE.  Diet: *** Exercise: ***   Flowsheet Row Office Visit from 07/28/2020 in [facility]  AUDIT-C Score 0      Depression: Phq 9 is  {Desc; negative/positive:13464} Depression screen [facility] 2/9 07/28/2020 06/04/2020 12/24/2019 12/13/2019 12/04/2019  Decreased Interest 0 0 0 0 0  Down, Depressed, Hopeless 1 0 0 0 0  PHQ - 2 Score 1 0 0 0 0  Altered sleeping - - - 0 -  Tired, decreased energy - - - 0 -  Change in appetite - - - 0 -  Feeling bad or failure about yourself  - - - 0 -  Trouble concentrating - - - 0 -  Moving slowly or fidgety/restless - - - 0 -  Suicidal thoughts - - - 0 -  PHQ-9 Score - - - 0 -  Difficult doing work/chores - - - Not difficult at all -   Hypertension: BP Readings from Last 3 Encounters:  07/28/20 118/76  06/04/20 130/76  04/09/20 110/73   Obesity: Wt Readings from Last 3 Encounters:  07/28/20 189 lb 4.8 oz (85.9 kg)  06/04/20 197 lb 3.2 oz (89.4 kg)  04/09/20 180 lb (81.6 kg)   BMI Readings from Last 3 Encounters:  07/28/20 32.49 kg/m  06/04/20 33.85 kg/m  04/09/20 29.05 kg/m     Vaccines:  HPV: up to at age 76 , ask insurance if age between 60-45  Shingrix: 51-64 yo and ask insurance if covered when patient above 54 yo Pneumonia: educated and discussed with patient. Flu: educated and discussed with patient.  Hep C Screening: 07/28/2020 STD testing and prevention (HIV/chl/gon/syphilis): 06/04/2020 Intimate partner violence:*** Sexual History : Menstrual History/LMP/Abnormal Bleeding:  Incontinence Symptoms:   Breast cancer:  - Last Mammogram: never done, ordered - BRCA gene screening: mother had breast cancer  Osteoporosis: Discussed high calcium and vitamin D supplementation, weight bearing exercises  Cervical cancer screening:  hysterectomy  Skin cancer: Discussed monitoring for atypical lesions  Colorectal cancer: *** Lung cancer:  Low Dose CT Chest recommended if Age 75-80 years, 20 pack-year currently smoking OR have quit w/in 15years. Patient does not qualify.   ECG: 04/10/20  Advanced Care Planning: A voluntary discussion about advance care planning including the explanation and discussion of advance directives.  Discussed health care proxy and Living will, and the patient was able to identify a health care proxy as ***.  Patient {DOES_DOES IEP:32951} have a living will at present time. If patient does have living will, I have requested they bring this to the clinic to be scanned in to their chart.  Lipids: Lab Results  Component Value Date   CHOL 189 07/28/2020   CHOL 137 07/26/2019   CHOL 149 06/15/2019   Lab Results  Component Value Date   HDL 42 (L) 07/28/2020   HDL 41 (L) 07/26/2019   HDL 36 (L) 06/15/2019   Lab Results  Component Value Date   LDLCALC 114 (H) 07/28/2020   LDLCALC 70 07/26/2019   LDLCALC 76 06/15/2019   Lab Results  Component Value Date   TRIG 213 (H) 07/28/2020   TRIG 187 (H) 07/26/2019   TRIG 185 (H) 06/15/2019   Lab Results  Component Value Date   CHOLHDL 4.5 07/28/2020   CHOLHDL 3.3 07/26/2019  CHOLHDL 4.1 06/15/2019   No results found for: LDLDIRECT  Glucose: Glucose  Date Value Ref Range Status  01/01/2015 146 (H) mg/dL Final    Comment:    65-99 NOTE: New Reference Range  11/25/14   04/29/2014 150 (H) 65 - 99 mg/dL Final  05/29/2013 144 (H) 65 - 99 mg/dL Final   Glucose, Bld  Date Value Ref Range Status  07/28/2020 315 (H) 65 - 99 mg/dL Final    Comment:    .            Fasting reference interval . For someone without known diabetes, a glucose value >125 mg/dL indicates that they may have diabetes and this should be confirmed with a follow-up test. .   04/09/2020 310 (H) 70 - 99 mg/dL Final    Comment:    Glucose reference range applies  only to samples taken after fasting for at least 8 hours.  08/22/2019 316 (H) 65 - 99 mg/dL Final    Comment:    .            Fasting reference interval . For someone without known diabetes, a glucose value >125 mg/dL indicates that they may have diabetes and this should be confirmed with a follow-up test. .    Glucose-Capillary  Date Value Ref Range Status  06/15/2019 189 (H) 70 - 99 mg/dL Final  06/15/2019 255 (H) 70 - 99 mg/dL Final  06/14/2019 297 (H) 70 - 99 mg/dL Final    Patient Active Problem List   Diagnosis Date Noted   Chronic active hepatitis C ([place]) 09/30/2019   Type 2 diabetes mellitus with hyperglycemia ([place]) 09/30/2019   Recurrent major depressive episodes, moderate ([place]) 06/15/2019   Transient cerebral ischemia 06/14/2019   Diabetes mellitus without complication ([place]) 29/24/4628   Hypertension 07/11/2017   Anxiety 01/19/2012    Past Surgical History:  Procedure Laterality Date   ABDOMINAL HYSTERECTOMY     GALLBLADDER SURGERY      Family History  Problem Relation Age of Onset   Breast cancer Mother     Social History   Socioeconomic History   Marital status: Single    Spouse name: Not on file   Number of children: 1   Years of education: Not on file   Highest education level: Not on file  Occupational History   Occupation: Water engineer    Comment: cleaning  Tobacco Use   Smoking status: Never   Smokeless tobacco: Never  Vaping Use   Vaping Use: Never used  Substance and Sexual Activity   Alcohol use: Yes    Comment: Socially    Drug use: No   Sexual activity: Yes    Partners: Male    Birth control/protection: Surgical  Other Topics Concern   Not on file  Social History Narrative   Daughter lives with patient   Social Determinants of Health   Financial Resource Strain: Not on file  Food Insecurity: Not on file  Transportation Needs: Not on file  Physical Activity: Not on file  Stress: Not on file  Social  Connections: Not on file  Intimate Partner Violence: Not on file     Current Outpatient Medications:    Accu-Chek Softclix Lancets lancets, Check BG TID DX:E11.65, LON:99 (Patient not taking: Reported on 07/28/2020), Disp: 100 each, Rfl: 12   aspirin 81 MG chewable tablet, Chew by mouth daily., Disp: , Rfl:    Blood Glucose Monitoring Suppl (ACCU-CHEK GUIDE ME) w/Device KIT, Check BG TID LON:99,  DX:E11.65, Disp: 1 kit, Rfl: 0   gabapentin (NEURONTIN) 100 MG capsule, Take 100 mg twice a day for one week, then increase to 200 mg(2 tablets) twice a day and continue, Disp: , Rfl:    glucose blood (ACCU-CHEK GUIDE) test strip, Dx:E11.65, LON:99 check BG tid (Patient not taking: Reported on 07/28/2020), Disp: 100 each, Rfl: 12   hydrochlorothiazide (HYDRODIURIL) 12.5 MG tablet, Take 1 tablet (12.5 mg total) by mouth daily., Disp: 90 tablet, Rfl: 3   hydrOXYzine (ATARAX/VISTARIL) 10 MG tablet, TAKE 1 TABLET (10 MG TOTAL) BY MOUTH EVERY 8 (EIGHT) HOURS AS NEEDED FOR ANXIETY., Disp: 270 tablet, Rfl: 1   insulin aspart (NOVOLOG) 100 UNIT/ML FlexPen, Inject 6 Units into the skin 3 (three) times daily with meals., Disp: 15 mL, Rfl: 11   Insulin Detemir (LEVEMIR) 100 UNIT/ML Pen, Inject 24-50 Units into the skin daily., Disp: 15 mL, Rfl: 3   liraglutide (VICTOZA) 18 MG/3ML SOPN, Inject 0.36m once daily for 7 days, then increase to 1.217monce daily., Disp: 9 mL, Rfl: 3   lisinopril (ZESTRIL) 40 MG tablet, Take 1 tablet (40 mg total) by mouth daily., Disp: 90 tablet, Rfl: 1   rosuvastatin (CRESTOR) 10 MG tablet, Take 1 tablet (10 mg total) by mouth at bedtime., Disp: 90 tablet, Rfl: 3  No Known Allergies   ROS  ***  Objective  There were no vitals filed for this visit.  There is no height or weight on file to calculate BMI.  Physical Exam ***  No results found for this or any previous visit (from the past 2160 hour(s)).  Diabetic Foot Exam: Diabetic Foot Exam - Simple   No data filed     ***  Fall Risk: Fall Risk  07/28/2020 06/04/2020 12/13/2019 08/22/2019 08/21/2019  Falls in the past year? 0 0 0 0 0  Number falls in past yr: 0 0 0 0 -  Injury with Fall? 0 0 0 0 -  Risk for fall due to : - - - - Other (Comment)  Risk for fall due to: Comment - - - - Low Risk/Discussed possible medication side effects.  Follow up Falls evaluation completed Falls evaluation completed - Falls evaluation completed Falls prevention discussed   ***  Functional Status Survey:   ***  Assessment & Plan  There are no diagnoses linked to this encounter.  -USPSTF grade A and B recommendations reviewed with patient; age-appropriate recommendations, preventive care, screening tests, etc discussed and encouraged; healthy living encouraged; see AVS for patient education given to patient -Discussed importance of 150 minutes of physical activity weekly, eat two servings of fish weekly, eat one serving of tree nuts ( cashews, pistachios, pecans, almonds..)Marland Kitchenevery other day, eat 6 servings of fruit/vegetables daily and drink plenty of water and avoid sweet beverages.   -Reviewed Health Maintenance: ***

## 2021-08-05 LAB — HM DIABETES EYE EXAM

## 2021-09-01 ENCOUNTER — Encounter: Payer: Self-pay | Admitting: Family Medicine

## 2021-09-09 ENCOUNTER — Other Ambulatory Visit: Payer: Self-pay | Admitting: Family Medicine

## 2021-09-09 DIAGNOSIS — I10 Essential (primary) hypertension: Secondary | ICD-10-CM

## 2021-09-09 NOTE — Telephone Encounter (Signed)
Medication Refill - Medication: lisinopril (ZESTRIL) 40 MG tablet   Has the patient contacted their pharmacy? Yes.   (Agent: If no, request that the patient contact the pharmacy for the refill. If patient does not wish to contact the pharmacy document the reason why and proceed with request.) (Agent: If yes, when and what did the pharmacy advise?) No refills / call pcp Preferred Pharmacy (with phone number or street name): [facility]  Has the patient been seen for an appointment in the last year OR does the patient have an upcoming appointment? No.  Agent: Please be advised that RX refills may take up to 3 business days. We ask that you follow-up with your pharmacy.

## 2021-09-09 NOTE — Telephone Encounter (Signed)
Requested medication (s) are due for refill today -yes  Requested medication (s) are on the active medication list -yes  Future visit scheduled -yes  Last refill: 01/14/21 #90 1RF  Notes to clinic: Call to patient- scheduled appointment- patient request RF until appointment  Requested Prescriptions  Pending Prescriptions Disp Refills   lisinopril (ZESTRIL) 40 MG tablet 90 tablet 1    Sig: Take 1 tablet (40 mg total) by mouth daily.     Cardiovascular:  ACE Inhibitors Failed - 09/09/2021 11:42 AM      Failed - Cr in normal range and within 180 days    Creat  Date Value Ref Range Status  07/28/2020 0.89 0.50 - 1.10 mg/dL Final   Creatinine, Urine  Date Value Ref Range Status  08/22/2019 72 20 - 275 mg/dL Final          Failed - K in normal range and within 180 days    Potassium  Date Value Ref Range Status  07/28/2020 3.9 3.5 - 5.3 mmol/L Final  01/01/2015 3.7 mmol/L Final    Comment:    3.5-5.1 NOTE: New Reference Range  11/25/14           Failed - Valid encounter within last 6 months    Recent Outpatient Visits           1 year ago Adult general medical exam   [facility] Danelle Berry, PA-C   1 year ago Acute vaginitis   [facility] Danelle Berry, PA-C   1 year ago Diabetes mellitus without complication [facility])   [facility] Danelle Berry, PA-C   2 years ago Diabetes mellitus without complication [facility])   [facility] Doren Custard, FNP   2 years ago History of transient ischemic attack (TIA)   [facility] Doren Custard, FNP       Future Appointments             In 2 months Danelle Berry, PA-C [facility], [facility]            Passed - Patient is not pregnant      Passed - Last BP in normal range    BP Readings from Last 1 Encounters:  07/28/20 118/76             Requested Prescriptions  Pending Prescriptions Disp Refills    lisinopril (ZESTRIL) 40 MG tablet 90 tablet 1    Sig: Take 1 tablet (40 mg total) by mouth daily.     Cardiovascular:  ACE Inhibitors Failed - 09/09/2021 11:42 AM      Failed - Cr in normal range and within 180 days    Creat  Date Value Ref Range Status  07/28/2020 0.89 0.50 - 1.10 mg/dL Final   Creatinine, Urine  Date Value Ref Range Status  08/22/2019 72 20 - 275 mg/dL Final          Failed - K in normal range and within 180 days    Potassium  Date Value Ref Range Status  07/28/2020 3.9 3.5 - 5.3 mmol/L Final  01/01/2015 3.7 mmol/L Final    Comment:    3.5-5.1 NOTE: New Reference Range  11/25/14           Failed - Valid encounter within last 6 months    Recent Outpatient Visits           1 year ago Adult general medical exam  [facility] Danelle Berry, PA-C   1 year ago Acute vaginitis   [facility] Danelle Berry, PA-C   1 year ago Diabetes mellitus without complication [facility])   [facility] Danelle Berry, PA-C   2 years ago Diabetes mellitus without complication [facility])   [facility] Doren Custard, FNP   2 years ago History of transient ischemic attack (TIA)   [facility] Doren Custard, FNP       Future Appointments             In 2 months Danelle Berry, PA-C [facility], [facility]            Passed - Patient is not pregnant      Passed - Last BP in normal range    BP Readings from Last 1 Encounters:  07/28/20 118/76

## 2021-09-09 NOTE — Telephone Encounter (Signed)
Appt scheduled with Dr Andrews for 12.23.2022   

## 2021-09-10 ENCOUNTER — Ambulatory Visit: Payer: Medicaid Other | Admitting: Internal Medicine

## 2021-09-13 ENCOUNTER — Other Ambulatory Visit: Payer: Self-pay | Admitting: Family Medicine

## 2021-09-13 DIAGNOSIS — I10 Essential (primary) hypertension: Secondary | ICD-10-CM

## 2021-11-18 ENCOUNTER — Other Ambulatory Visit: Payer: Self-pay | Admitting: Family Medicine

## 2021-11-18 ENCOUNTER — Other Ambulatory Visit: Payer: Self-pay

## 2021-11-18 ENCOUNTER — Ambulatory Visit: Payer: Medicaid Other | Admitting: Internal Medicine

## 2021-11-18 ENCOUNTER — Encounter: Payer: Self-pay | Admitting: Internal Medicine

## 2021-11-18 ENCOUNTER — Other Ambulatory Visit (HOSPITAL_COMMUNITY)
Admission: RE | Admit: 2021-11-18 | Discharge: 2021-11-18 | Disposition: A | Discharge status: Home / Self Care | Payer: Medicaid Other | Source: Ambulatory Visit | Attending: Family Medicine | Admitting: Family Medicine

## 2021-11-18 VITALS — BP 132/84 | HR 104 | Temp 98.5°F | Resp 16 | Ht 64.0 in | Wt 187.0 lb

## 2021-11-18 DIAGNOSIS — Z1211 Encounter for screening for malignant neoplasm of colon: Secondary | ICD-10-CM | POA: Diagnosis not present

## 2021-11-18 DIAGNOSIS — Z23 Encounter for immunization: Secondary | ICD-10-CM | POA: Diagnosis not present

## 2021-11-18 DIAGNOSIS — B9689 Other specified bacterial agents as the cause of diseases classified elsewhere: Secondary | ICD-10-CM

## 2021-11-18 DIAGNOSIS — Z0000 Encounter for general adult medical examination without abnormal findings: Secondary | ICD-10-CM

## 2021-11-18 DIAGNOSIS — B379 Candidiasis, unspecified: Secondary | ICD-10-CM

## 2021-11-18 DIAGNOSIS — N760 Acute vaginitis: Secondary | ICD-10-CM

## 2021-11-18 DIAGNOSIS — Z1231 Encounter for screening mammogram for malignant neoplasm of breast: Secondary | ICD-10-CM | POA: Diagnosis not present

## 2021-11-18 DIAGNOSIS — Z113 Encounter for screening for infections with a predominantly sexual mode of transmission: Secondary | ICD-10-CM | POA: Insufficient documentation

## 2021-11-18 MED ORDER — PEG 3350-KCL-NA BICARB-NACL 420 G PO SOLR: 4000.0000 mL | Freq: Once | ORAL | 0 refills | Status: AC

## 2021-11-18 NOTE — Patient Instructions (Signed)
It was great seeing you today! ? ?Plan discussed at today's visit: ?-Blood work ordered today, results will be uploaded to MyChart.  ?-Mammogram ordered ?-GI referral ordered to colonoscopy  ?-Flu shot today ? ?Follow up in: 3 months  ? ?Take care and let us know if you have any questions or concerns prior to your next visit. ? ?Dr. Caralee Ates ? ?

## 2021-11-18 NOTE — Progress Notes (Signed)
Name: Kelly [name]   MRN: [id number]    DOB: 1971-02-05   Date:11/18/2021       Progress Note  Subjective  Chief Complaint  Chief Complaint  Patient presents with   Annual Exam   HPI  Patient presents for annual CPE.  Diet: hamburger, hotdogs, not enough fruit and vegatables  Exercise: walks every day    [place] Office Visit from 07/28/2020 in [facility]  AUDIT-C Score 0      Depression: Phq 9 is  negative Depression screen [facility] 2/9 11/18/2021 07/28/2020 06/04/2020 12/24/2019 12/13/2019  Decreased Interest 0 0 0 0 0  Down, Depressed, Hopeless 0 1 0 0 0  PHQ - 2 Score 0 1 0 0 0  Altered sleeping 0 - - - 0  Tired, decreased energy 0 - - - 0  Change in appetite 0 - - - 0  Feeling bad or failure about yourself  0 - - - 0  Trouble concentrating 0 - - - 0  Moving slowly or fidgety/restless 0 - - - 0  Suicidal thoughts 0 - - - 0  PHQ-9 Score 0 - - - 0  Difficult doing work/chores Not difficult at all - - - Not difficult at all   Hypertension: BP Readings from Last 3 Encounters:  07/28/20 118/76  06/04/20 130/76  04/09/20 110/73   Obesity: Wt Readings from Last 3 Encounters:  11/18/21 187 lb (84.8 kg)  07/28/20 189 lb 4.8 oz (85.9 kg)  06/04/20 197 lb 3.2 oz (89.4 kg)   BMI Readings from Last 3 Encounters:  11/18/21 32.10 kg/m  07/28/20 32.49 kg/m  06/04/20 33.85 kg/m     Vaccines:   HPV: n/a Tdap: UTD Shingrix: n/a Pneumonia: Prevnar 21 in 2020 Flu: Due today COVID-19: none   Hep C Screening: 11/21 STD testing and prevention (HIV/chl/gon/syphilis): interested in screening today Intimate partner violence: negative Menstrual History/LMP/Abnormal Bleeding: hysterectomy  Discussed importance of follow up if any post-menopausal bleeding: yes Incontinence Symptoms: No.  Breast cancer:  - Last Mammogram: 2012 but cannot see results, family history of breast cancer in mother - BRCA gene screening:   Osteoporosis Prevention :  Discussed high calcium and vitamin D supplementation, weight bearing exercises  Cervical cancer screening: hysterectomy   Skin cancer: Discussed monitoring for atypical lesions  Colorectal cancer: Due   Lung cancer:  Low Dose CT Chest recommended if Age 78-80 years, 20 pack-year currently smoking OR have quit w/in 15years. Patient does not qualify.   ECG: 7/21  Advanced Care Planning: A voluntary discussion about advance care planning including the explanation and discussion of advance directives.  Discussed health care proxy and Living will, and the patient was unable to identify a health care proxy. Patient does not   Lipids: Lab Results  Component Value Date   CHOL 189 07/28/2020   CHOL 137 07/26/2019   CHOL 149 06/15/2019   Lab Results  Component Value Date   HDL 42 (L) 07/28/2020   HDL 41 (L) 07/26/2019   HDL 36 (L) 06/15/2019   Lab Results  Component Value Date   LDLCALC 114 (H) 07/28/2020   LDLCALC 70 07/26/2019   LDLCALC 76 06/15/2019   Lab Results  Component Value Date   TRIG 213 (H) 07/28/2020   TRIG 187 (H) 07/26/2019   TRIG 185 (H) 06/15/2019   Lab Results  Component Value Date   CHOLHDL 4.5 07/28/2020   CHOLHDL 3.3 07/26/2019   CHOLHDL 4.1 06/15/2019   No  results found for: LDLDIRECT  Glucose: Glucose  Date Value Ref Range Status  01/01/2015 146 (H) mg/dL Final    Comment:    65-99 NOTE: New Reference Range  11/25/14   04/29/2014 150 (H) 65 - 99 mg/dL Final  05/29/2013 144 (H) 65 - 99 mg/dL Final   Glucose, Bld  Date Value Ref Range Status  07/28/2020 315 (H) 65 - 99 mg/dL Final    Comment:    .            Fasting reference interval . For someone without known diabetes, a glucose value >125 mg/dL indicates that they may have diabetes and this should be confirmed with a follow-up test. .   04/09/2020 310 (H) 70 - 99 mg/dL Final    Comment:    Glucose reference range applies only to samples taken after fasting for at least 8 hours.   08/22/2019 316 (H) 65 - 99 mg/dL Final    Comment:    .            Fasting reference interval . For someone without known diabetes, a glucose value >125 mg/dL indicates that they may have diabetes and this should be confirmed with a follow-up test. .    Glucose-Capillary  Date Value Ref Range Status  06/15/2019 189 (H) 70 - 99 mg/dL Final  06/15/2019 255 (H) 70 - 99 mg/dL Final  06/14/2019 297 (H) 70 - 99 mg/dL Final    Patient Active Problem List   Diagnosis Date Noted   Chronic active hepatitis C ([place]) 09/30/2019   Type 2 diabetes mellitus with hyperglycemia ([place]) 09/30/2019   Recurrent major depressive episodes, moderate ([place]) 06/15/2019   Transient cerebral ischemia 06/14/2019   Diabetes mellitus without complication ([place]) 62/94/7654   Hypertension 07/11/2017   Anxiety 01/19/2012    Past Surgical History:  Procedure Laterality Date   ABDOMINAL HYSTERECTOMY     GALLBLADDER SURGERY      Family History  Problem Relation Age of Onset   Breast cancer Mother     Social History   Socioeconomic History   Marital status: Single    Spouse name: Not on file   Number of children: 1   Years of education: Not on file   Highest education level: Not on file  Occupational History   Occupation: Water engineer    Comment: cleaning  Tobacco Use   Smoking status: Never   Smokeless tobacco: Never  Vaping Use   Vaping Use: Never used  Substance and Sexual Activity   Alcohol use: Yes    Comment: Socially    Drug use: No   Sexual activity: Yes    Partners: Male    Birth control/protection: Surgical  Other Topics Concern   Not on file  Social History Narrative   Daughter lives with patient   Social Determinants of Health   Financial Resource Strain: Not on file  Food Insecurity: Not on file  Transportation Needs: Not on file  Physical Activity: Not on file  Stress: Not on file  Social Connections: Not on file  Intimate Partner Violence: Not on file      Current Outpatient Medications:    Accu-Chek Softclix Lancets lancets, Check BG TID DX:E11.65, LON:99, Disp: 100 each, Rfl: 12   aspirin 81 MG chewable tablet, Chew by mouth daily., Disp: , Rfl:    Blood Glucose Monitoring Suppl (ACCU-CHEK GUIDE ME) w/Device KIT, Check BG TID LON:99, DX:E11.65, Disp: 1 kit, Rfl: 0   gabapentin (NEURONTIN) 100 MG  capsule, Take 100 mg twice a day for one week, then increase to 200 mg(2 tablets) twice a day and continue, Disp: , Rfl:    glucose blood (ACCU-CHEK GUIDE) test strip, Dx:E11.65, LON:99 check BG tid, Disp: 100 each, Rfl: 12   hydrochlorothiazide (HYDRODIURIL) 12.5 MG tablet, Take 1 tablet (12.5 mg total) by mouth daily., Disp: 90 tablet, Rfl: 3   hydrOXYzine (ATARAX/VISTARIL) 10 MG tablet, TAKE 1 TABLET (10 MG TOTAL) BY MOUTH EVERY 8 (EIGHT) HOURS AS NEEDED FOR ANXIETY., Disp: 270 tablet, Rfl: 1   insulin aspart (NOVOLOG) 100 UNIT/ML FlexPen, Inject 6 Units into the skin 3 (three) times daily with meals., Disp: 15 mL, Rfl: 11   Insulin Detemir (LEVEMIR) 100 UNIT/ML Pen, Inject 24-50 Units into the skin daily., Disp: 15 mL, Rfl: 3   liraglutide (VICTOZA) 18 MG/3ML SOPN, Inject 0.44m once daily for 7 days, then increase to 1.233monce daily., Disp: 9 mL, Rfl: 3   lisinopril (ZESTRIL) 40 MG tablet, Take 1 tablet (40 mg total) by mouth daily., Disp: 90 tablet, Rfl: 1   rosuvastatin (CRESTOR) 10 MG tablet, Take 1 tablet (10 mg total) by mouth at bedtime., Disp: 90 tablet, Rfl: 3  No Known Allergies   Review of Systems  Constitutional: Negative.   Respiratory: Negative.    Cardiovascular: Negative.   Gastrointestinal: Negative.   Genitourinary: Negative.     Objective  Vitals:   11/18/21 1335  Pulse: (!) 104  Resp: 16  Temp: 98.5 F (36.9 C)  SpO2: 98%  Weight: 187 lb (84.8 kg)  Height: _0  (1.626 m)    Body mass index is 32.1 kg/m.  Physical Exam Constitutional:      Appearance: Normal appearance.  HENT:     Head:  Normocephalic and atraumatic.  Eyes:     Conjunctiva/sclera: Conjunctivae normal.  Cardiovascular:     Rate and Rhythm: Normal rate and regular rhythm.  Pulmonary:     Effort: Pulmonary effort is normal.     Breath sounds: Normal breath sounds.  Musculoskeletal:     Right lower leg: No edema.     Left lower leg: No edema.  Skin:    General: Skin is warm and dry.  Neurological:     General: No focal deficit present.     Mental Status: She is alert. Mental status is at baseline.  Psychiatric:        Mood and Affect: Mood normal.        Behavior: Behavior normal.     No results found for this or any previous visit (from the past 2160 hour(s)).   Fall Risk: Fall Risk  11/18/2021 07/28/2020 06/04/2020 12/13/2019 08/22/2019  Falls in the past year? 0 0 0 0 0  Number falls in past yr: 0 0 0 0 0  Injury with Fall? 0 0 0 0 0  Risk for fall due to : - - - - -  Risk for fall due to: Comment - - - - -  Follow up - Falls evaluation completed Falls evaluation completed - Falls evaluation completed    Functional Status Survey: Is the patient deaf or have difficulty hearing?: No Does the patient have difficulty seeing, even when wearing glasses/contacts?: Yes Does the patient have difficulty concentrating, remembering, or making decisions?: No Does the patient have difficulty walking or climbing stairs?: No Does the patient have difficulty dressing or bathing?: No Does the patient have difficulty doing errands alone such as visiting a doctor's office or shopping?: No  Assessment & Plan  1. Adult general medical exam - follow up in 1-3 months for routine medical follow up.   - CBC w/Diff/Platelet - COMPLETE METABOLIC PANEL WITH GFR - Lipid Profile - HgB A1c  2. Screening examination for STD (sexually transmitted disease)  - HIV antibody (with reflex) - RPR - Cervicovaginal ancillary only  3. Encounter for screening mammogram for malignant neoplasm of breast  - MM 3D SCREEN  BREAST BILATERAL; Future  4. Screening for colon cancer  - Ambulatory referral to Gastroenterology  5. Need for influenza vaccination  - Flu Vaccine QUAD 6+ mos PF IM (Fluarix Quad PF)   -USPSTF grade A and B recommendations reviewed with patient; age-appropriate recommendations, preventive care, screening tests, etc discussed and encouraged; healthy living encouraged; see AVS for patient education given to patient -Discussed importance of 150 minutes of physical activity weekly, eat two servings of fish weekly, eat one serving of tree nuts ( cashews, pistachios, pecans, almonds.[name]) every other day, eat 6 servings of fruit/vegetables daily and drink plenty of water and avoid sweet beverages.   -Reviewed Health Maintenance: Yes.

## 2021-11-18 NOTE — Progress Notes (Signed)
Gastroenterology Pre-Procedure Review ? ?Request Date: 12/21/2021 ?Requesting Physician: Dr. Allen Norris ? ?PATIENT REVIEW QUESTIONS: The patient responded to the following health history questions as indicated:   ? ?1. Are you having any GI issues? no ?2. Do you have a personal history of Polyps? no ?3. Do you have a family history of Colon Cancer or Polyps? no ?4. Diabetes Mellitus? yes (type 2 ) ?5. Joint replacements in the past 12 months?no ?6. Major health problems in the past 3 months?no ?7. Any artificial heart valves, MVP, or defibrillator?no ?   ?MEDICATIONS & ALLERGIES:    ?Patient reports the following regarding taking any anticoagulation/antiplatelet therapy:   ?Plavix, Coumadin, Eliquis, Xarelto, Lovenox, Pradaxa, Brilinta, or Effient? no ?Aspirin? yes (asprin 81 mg) ? ?Patient confirms/reports the following medications:  ?Current Outpatient Medications  ?Medication Sig Dispense Refill  ? Accu-Chek Softclix Lancets lancets Check BG TID DX:E11.65, LON:99 100 each 12  ? aspirin 81 MG chewable tablet Chew by mouth daily.    ? Blood Glucose Monitoring Suppl (ACCU-CHEK GUIDE ME) w/Device KIT Check BG TID LON:99, DX:E11.65 1 kit 0  ? gabapentin (NEURONTIN) 100 MG capsule Take 100 mg twice a day for one week, then increase to 200 mg(2 tablets) twice a day and continue    ? glucose blood (ACCU-CHEK GUIDE) test strip Dx:E11.65, LON:99 check BG tid 100 each 12  ? hydrochlorothiazide (HYDRODIURIL) 12.5 MG tablet Take 1 tablet (12.5 mg total) by mouth daily. 90 tablet 3  ? hydrOXYzine (ATARAX/VISTARIL) 10 MG tablet TAKE 1 TABLET (10 MG TOTAL) BY MOUTH EVERY 8 (EIGHT) HOURS AS NEEDED FOR ANXIETY. 270 tablet 1  ? insulin aspart (NOVOLOG) 100 UNIT/ML FlexPen Inject 6 Units into the skin 3 (three) times daily with meals. 15 mL 11  ? Insulin Detemir (LEVEMIR) 100 UNIT/ML Pen Inject 24-50 Units into the skin daily. 15 mL 3  ? liraglutide (VICTOZA) 18 MG/3ML SOPN Inject 0.81m once daily for 7 days, then increase to 1.263monce  daily. 9 mL 3  ? lisinopril (ZESTRIL) 40 MG tablet Take 1 tablet (40 mg total) by mouth daily. 90 tablet 1  ? rosuvastatin (CRESTOR) 10 MG tablet Take 1 tablet (10 mg total) by mouth at bedtime. 90 tablet 3  ? ?No current facility-administered medications for this visit.  ? ? ?Patient confirms/reports the following allergies:  ?No Known Allergies ? ?No orders of the defined types were placed in this encounter. ? ? ?AUTHORIZATION INFORMATION ?Primary Insurance: ?1D#: ?Group #: ? ?Secondary Insurance: ?1D#: ?Group #: ? ?SCHEDULE INFORMATION: ?Date: 12/21/2021 ?Time: ?Location:[facility] ? ?

## 2021-11-19 LAB — CBC WITH DIFFERENTIAL/PLATELET
Absolute Monocytes: 333 cells/uL (ref 200–950)
Basophils Absolute: 31 cells/uL (ref 0–200)
Basophils Relative: 0.6 %
Eosinophils Absolute: 192 cells/uL (ref 15–500)
Eosinophils Relative: 3.7 %
HCT: 40 % (ref 35.0–45.0)
Hemoglobin: 13 g/dL (ref 11.7–15.5)
Lymphs Abs: 1950 cells/uL (ref 850–3900)
MCH: 27.3 pg (ref 27.0–33.0)
MCHC: 32.5 g/dL (ref 32.0–36.0)
MCV: 83.9 fL (ref 80.0–100.0)
MPV: 12 fL (ref 7.5–12.5)
Monocytes Relative: 6.4 %
Neutro Abs: 2694 cells/uL (ref 1500–7800)
Neutrophils Relative %: 51.8 %
Platelets: 218 10*3/uL (ref 140–400)
RBC: 4.77 10*6/uL (ref 3.80–5.10)
RDW: 13 % (ref 11.0–15.0)
Total Lymphocyte: 37.5 %
WBC: 5.2 10*3/uL (ref 3.8–10.8)

## 2021-11-19 LAB — COMPLETE METABOLIC PANEL WITH GFR
AG Ratio: 1.3 (calc) (ref 1.0–2.5)
ALT: 8 U/L (ref 6–29)
AST: 9 U/L — ABNORMAL LOW (ref 10–35)
Albumin: 4.2 g/dL (ref 3.6–5.1)
Alkaline phosphatase (APISO): 56 U/L (ref 37–153)
BUN: 10 mg/dL (ref 7–25)
CO2: 27 mmol/L (ref 20–32)
Calcium: 9.9 mg/dL (ref 8.6–10.4)
Chloride: 104 mmol/L (ref 98–110)
Creat: 0.9 mg/dL (ref 0.50–1.03)
Globulin: 3.3 g/dL (calc) (ref 1.9–3.7)
Glucose, Bld: 237 mg/dL — ABNORMAL HIGH (ref 65–99)
Potassium: 4 mmol/L (ref 3.5–5.3)
Sodium: 140 mmol/L (ref 135–146)
Total Bilirubin: 0.4 mg/dL (ref 0.2–1.2)
Total Protein: 7.5 g/dL (ref 6.1–8.1)
eGFR: 78 mL/min/{1.73_m2} (ref >=60)

## 2021-11-19 LAB — LIPID PANEL
Cholesterol: 202 mg/dL — ABNORMAL HIGH (ref <200)
HDL: 53 mg/dL (ref >=50)
LDL Cholesterol (Calc): 126 mg/dL (calc) — ABNORMAL HIGH
Non-HDL Cholesterol (Calc): 149 mg/dL (calc) — ABNORMAL HIGH (ref <130)
Total CHOL/HDL Ratio: 3.8 (calc) (ref <5.0)
Triglycerides: 124 mg/dL (ref <150)

## 2021-11-19 LAB — HEMOGLOBIN A1C
Hgb A1c MFr Bld: 11 % of total Hgb — ABNORMAL HIGH (ref <5.7)
Mean Plasma Glucose: 269 mg/dL
eAG (mmol/L): 14.9 mmol/L

## 2021-11-19 LAB — RPR: RPR Ser Ql: NONREACTIVE

## 2021-11-19 LAB — HIV ANTIBODY (ROUTINE TESTING W REFLEX): HIV 1&2 Ab, 4th Generation: NONREACTIVE

## 2021-11-22 LAB — CERVICOVAGINAL ANCILLARY ONLY
Bacterial Vaginitis (gardnerella): POSITIVE — AB
Candida Glabrata: NEGATIVE
Candida Vaginitis: POSITIVE — AB
Chlamydia: NEGATIVE
Comment: NEGATIVE
Comment: NEGATIVE
Comment: NEGATIVE
Comment: NEGATIVE
Comment: NEGATIVE
Comment: NORMAL
Neisseria Gonorrhea: NEGATIVE
Trichomonas: NEGATIVE

## 2021-11-23 MED ORDER — METRONIDAZOLE 500 MG PO TABS: 500.0000 mg | ORAL_TABLET | Freq: Two times a day (BID) | ORAL | 0 refills | Status: AC

## 2021-11-23 MED ORDER — FLUCONAZOLE 150 MG PO TABS: 150.0000 mg | ORAL_TABLET | Freq: Once | ORAL | 0 refills | Status: AC

## 2021-11-23 NOTE — Addendum Note (Signed)
Addended by: Margarita Mail on: 11/23/2021 09:37 AM ? ? Modules accepted: Orders ? ?

## 2021-11-29 ENCOUNTER — Ambulatory Visit: Payer: Medicaid Other | Admitting: Internal Medicine

## 2021-11-29 NOTE — Progress Notes (Deleted)
? ?Established Patient Office Visit ? ?Subjective:  ?Patient ID: Kelly [name], female    DOB: 1971/05/18  Age: 51 y.o. MRN: [id number] ? ?CC: No chief complaint on file. ? ? ?HPI ?Kelly [name] presents for follow up on chronic medical conditions.  ? ?Diabetes, Type 2: ? ?-Last A1c 11.0% 11/18/21 ?-Medications: Victoza, Levemir 24-50 units, Novolog 6 units with meals ?? ?-Patient is compliant with the above medications and reports no side effects. *** ?-Checking BG at home: *** ?-Fasting home BG: *** ?-Post-prandial home BG: *** ?-Highest home BG since last visit: *** ?-Lowest home BG since last visit: *** ?-Diet: *** ?-Exercise: *** ?-Eye exam: *** ?-Foot exam: *** ?-Microalbumin: *** ?-Statin: yes ?-PNA vaccine: *** ?-Denies symptoms of hypoglycemia, polyuria, polydipsia, numbness extremities, foot ulcers/trauma. *** ? ?Hypertension: ?-Medications: Lisinopril 40. HCTZ 12.5 ?-Patient is compliant with above medications and reports no side effects. ?-Checking BP at home (average): *** ?-Highest BP at home: *** ?-Lowest BP at home: *** ?-Denies any SOB, CP, vision changes, LE edema or symptoms of hypotension ?-Diet: *** ?-Exercise: *** ? ?HLD: ?-Medications: Crestor 10 ?-Patient is compliant with above medications and reports no side effects. *** ?-Last lipid panel: Lipid Panel  ?   ?Component Value Date/Time  ? CHOL 202 (H) 11/18/2021 1425  ? TRIG 124 11/18/2021 1425  ? HDL 53 11/18/2021 1425  ? CHOLHDL 3.8 11/18/2021 1425  ? VLDL 37 06/15/2019 0614  ? LDLCALC 126 (H) 11/18/2021 1425  ? ?The 10-year ASCVD risk score (Arnett DK, et al., 2019) is: 9.6% ?  Values used to calculate the score: ?    Age: 81 years ?    Sex: Female ?    Is Non-Hispanic African American: Yes ?    Diabetic: Yes ?    Tobacco smoker: No ?    Systolic Blood Pressure: 915 mmHg ?    Is BP treated: Yes ?    HDL Cholesterol: 53 mg/dL ?    Total Cholesterol: 202 mg/dL ? ? ?Chronic Hepatitis C: ?-Follows with GI ? ? ?Past Medical History:   ?Diagnosis Date  ? Anxiety   ? Diabetes mellitus without complication ([place])   ? Hyperlipidemia   ? Hypertension   ? ? ?Past Surgical History:  ?Procedure Laterality Date  ? ABDOMINAL HYSTERECTOMY    ? GALLBLADDER SURGERY    ? ? ?Family History  ?Problem Relation Age of Onset  ? Breast cancer Mother   ? ? ?Social History  ? ?Socioeconomic History  ? Marital status: Single  ?  Spouse name: Not on file  ? Number of children: 1  ? Years of education: Not on file  ? Highest education level: Not on file  ?Occupational History  ? 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 Smoking status: Never  ? Smokeless tobacco: Never  ?Vaping Use  ? Vaping Use: Never used  ?Substance and Sexual Activity  ? Alcohol use: Yes  ?  Comment: Socially   ? Drug use: No  ? Sexual activity: Yes  ?  Partners: Male  ?  Birth control/protection: Surgical  ?Other Topics Concern  ? Not on file  ?Social History Narrative  ? Daughter lives with patient  ? ?Social Determinants of Health  ? ?Financial Resource Strain: Not on file  ?Food Insecurity: No Food Insecurity  ? Worried About Charity fundraiser in the Last Year: Never true  ? Ran Out of Food in the Last Year: Never true  ?  Transportation Needs: No Transportation Needs  ? Lack of Transportation (Medical): No  ? Lack of Transportation (Non-Medical): No  ?Physical Activity: Inactive  ? Days of Exercise per Week: 0 days  ? Minutes of Exercise per Session: 0 min  ?Stress: No Stress Concern Present  ? Feeling of Stress : Not at all  ?Social Connections: Unknown  ? Frequency of Communication with Friends and Family: Three times a week  ? Frequency of Social Gatherings with Friends and Family: Twice a week  ? Attends Religious Services: Never  ? Active Member of Clubs or Organizations: No  ? Attends Archivist Meetings: Never  ? Marital Status: Not on file  ?Intimate Partner Violence: Not At Risk  ? Fear of Current or Ex-Partner: No  ? Emotionally Abused: No  ?  Physically Abused: No  ? Sexually Abused: No  ? ? ?Outpatient Medications Prior to Visit  ?Medication Sig Dispense Refill  ? Accu-Chek Softclix Lancets lancets Check BG TID DX:E11.65, LON:99 100 each 12  ? aspirin 81 MG chewable tablet Chew by mouth daily.    ? Blood Glucose Monitoring Suppl (ACCU-CHEK GUIDE ME) w/Device KIT Check BG TID LON:99, DX:E11.65 1 kit 0  ? gabapentin (NEURONTIN) 100 MG capsule Take 100 mg twice a day for one week, then increase to 200 mg(2 tablets) twice a day and continue    ? glucose blood (ACCU-CHEK GUIDE) test strip Dx:E11.65, LON:99 check BG tid 100 each 12  ? hydrochlorothiazide (HYDRODIURIL) 12.5 MG tablet Take 1 tablet (12.5 mg total) by mouth daily. 90 tablet 3  ? hydrOXYzine (ATARAX/VISTARIL) 10 MG tablet TAKE 1 TABLET (10 MG TOTAL) BY MOUTH EVERY 8 (EIGHT) HOURS AS NEEDED FOR ANXIETY. 270 tablet 1  ? insulin aspart (NOVOLOG) 100 UNIT/ML FlexPen Inject 6 Units into the skin 3 (three) times daily with meals. 15 mL 11  ? Insulin Detemir (LEVEMIR) 100 UNIT/ML Pen Inject 24-50 Units into the skin daily. 15 mL 3  ? liraglutide (VICTOZA) 18 MG/3ML SOPN Inject 0.$RemoveBefor'6mg'XocWgeZbhKEL$  once daily for 7 days, then increase to 1.$RemoveBefor'2mg'TPhQUUJmRdWT$  once daily. 9 mL 3  ? lisinopril (ZESTRIL) 40 MG tablet Take 1 tablet (40 mg total) by mouth daily. 90 tablet 1  ? metroNIDAZOLE (FLAGYL) 500 MG tablet Take 1 tablet (500 mg total) by mouth 2 (two) times daily for 7 days. 14 tablet 0  ? rosuvastatin (CRESTOR) 10 MG tablet Take 1 tablet (10 mg total) by mouth at bedtime. 90 tablet 3  ? ?No facility-administered medications prior to visit.  ? ? ?No Known Allergies ? ?ROS ?Review of Systems ? ?  ?Objective:  ?  ?Physical Exam ? ?There were no vitals taken for this visit. ?Wt Readings from Last 3 Encounters:  ?11/18/21 187 lb (84.8 kg)  ?07/28/20 189 lb 4.8 oz (85.9 kg)  ?06/04/20 197 lb 3.2 oz (89.4 kg)  ? ? ? ?Health Maintenance Due  ?Topic Date Due  ? MAMMOGRAM  Never done  ? COLONOSCOPY (Pts 45-72yrs Insurance coverage will need  to be confirmed)  Never done  ? FOOT EXAM  08/21/2020  ? Zoster Vaccines- Shingrix (1 of 2) Never done  ? ? ?There are no preventive care reminders to display for this patient. ? ?Lab Results  ?Component Value Date  ? TSH 1.446 07/12/2017  ? ?Lab Results  ?Component Value Date  ? WBC 5.2 11/18/2021  ? HGB 13.0 11/18/2021  ? HCT 40.0 11/18/2021  ? MCV 83.9 11/18/2021  ? PLT 218 11/18/2021  ? ?Lab  Results  ?Component Value Date  ? NA 140 11/18/2021  ? K 4.0 11/18/2021  ? CO2 27 11/18/2021  ? GLUCOSE 237 (H) 11/18/2021  ? BUN 10 11/18/2021  ? CREATININE 0.90 11/18/2021  ? BILITOT 0.4 11/18/2021  ? ALKPHOS 51 04/09/2020  ? AST 9 (L) 11/18/2021  ? ALT 8 11/18/2021  ? PROT 7.5 11/18/2021  ? ALBUMIN 4.0 04/09/2020  ? CALCIUM 9.9 11/18/2021  ? ANIONGAP 11 04/09/2020  ? EGFR 78 11/18/2021  ? ?Lab Results  ?Component Value Date  ? CHOL 202 (H) 11/18/2021  ? ?Lab Results  ?Component Value Date  ? HDL 53 11/18/2021  ? ?Lab Results  ?Component Value Date  ? LDLCALC 126 (H) 11/18/2021  ? ?Lab Results  ?Component Value Date  ? TRIG 124 11/18/2021  ? ?Lab Results  ?Component Value Date  ? CHOLHDL 3.8 11/18/2021  ? ?Lab Results  ?Component Value Date  ? HGBA1C 11.0 (H) 11/18/2021  ? ? ?  ?Assessment & Plan:  ? ?Problem List Items Addressed This Visit   ?None ? ? ?No orders of the defined types were placed in this encounter. ? ? ?Follow-up: No follow-ups on file.  ? ? ?Teodora Medici, DO ?

## 2021-12-09 ENCOUNTER — Other Ambulatory Visit: Payer: Self-pay

## 2021-12-09 ENCOUNTER — Emergency Department
Admission: EM | Admit: 2021-12-09 | Discharge: 2021-12-09 | Disposition: A | Discharge status: Home / Self Care | Payer: Medicaid Other | Attending: Emergency Medicine | Admitting: Emergency Medicine

## 2021-12-09 ENCOUNTER — Encounter: Payer: Self-pay | Admitting: Emergency Medicine

## 2021-12-09 ENCOUNTER — Emergency Department: Payer: Medicaid Other

## 2021-12-09 DIAGNOSIS — I10 Essential (primary) hypertension: Secondary | ICD-10-CM | POA: Diagnosis not present

## 2021-12-09 DIAGNOSIS — R0602 Shortness of breath: Secondary | ICD-10-CM | POA: Insufficient documentation

## 2021-12-09 DIAGNOSIS — E119 Type 2 diabetes mellitus without complications: Secondary | ICD-10-CM | POA: Diagnosis not present

## 2021-12-09 DIAGNOSIS — R0789 Other chest pain: Secondary | ICD-10-CM | POA: Diagnosis not present

## 2021-12-09 DIAGNOSIS — R062 Wheezing: Secondary | ICD-10-CM | POA: Diagnosis not present

## 2021-12-09 DIAGNOSIS — Z20822 Contact with and (suspected) exposure to covid-19: Secondary | ICD-10-CM | POA: Insufficient documentation

## 2021-12-09 LAB — RESP PANEL BY RT-PCR (FLU A&B, COVID) ARPGX2
Influenza A by PCR: NEGATIVE
Influenza B by PCR: NEGATIVE
SARS Coronavirus 2 by RT PCR: NEGATIVE

## 2021-12-09 LAB — CBC
HCT: 38.8 % (ref 36.0–46.0)
Hemoglobin: 12.6 g/dL (ref 12.0–15.0)
MCH: 26.8 pg (ref 26.0–34.0)
MCHC: 32.5 g/dL (ref 30.0–36.0)
MCV: 82.6 fL (ref 80.0–100.0)
Platelets: 222 10*3/uL (ref 150–400)
RBC: 4.7 MIL/uL (ref 3.87–5.11)
RDW: 12.6 % (ref 11.5–15.5)
WBC: 5 10*3/uL (ref 4.0–10.5)
nRBC: 0 % (ref 0.0–0.2)

## 2021-12-09 LAB — BASIC METABOLIC PANEL
Anion gap: 8 (ref 5–15)
BUN: 10 mg/dL (ref 6–20)
CO2: 29 mmol/L (ref 22–32)
Calcium: 9.3 mg/dL (ref 8.9–10.3)
Chloride: 102 mmol/L (ref 98–111)
Creatinine, Ser: 0.8 mg/dL (ref 0.44–1.00)
GFR, Estimated: >60 mL/min (ref >60)
Glucose, Bld: 264 mg/dL — ABNORMAL HIGH (ref 70–99)
Potassium: 3.9 mmol/L (ref 3.5–5.1)
Sodium: 139 mmol/L (ref 135–145)

## 2021-12-09 LAB — TROPONIN I (HIGH SENSITIVITY): Troponin I (High Sensitivity): 4 ng/L (ref <18)

## 2021-12-09 LAB — POC URINE PREG, ED: Preg Test, Ur: NEGATIVE

## 2021-12-09 MED ORDER — IPRATROPIUM-ALBUTEROL 0.5-2.5 (3) MG/3ML IN SOLN
3.0000 mL | Freq: Once | RESPIRATORY_TRACT | Status: AC
  Administered 2021-12-09: 3 mL via RESPIRATORY_TRACT
  Filled 2021-12-09: qty 3

## 2021-12-09 MED ORDER — IPRATROPIUM-ALBUTEROL 0.5-2.5 (3) MG/3ML IN SOLN
3.0000 mL | Freq: Once | RESPIRATORY_TRACT | Status: AC
  Administered 2021-12-09: 3 mL via RESPIRATORY_TRACT

## 2021-12-09 MED ORDER — ALBUTEROL SULFATE HFA 108 (90 BASE) MCG/ACT IN AERS: 2.0000 | INHALATION_SPRAY | Freq: Four times a day (QID) | RESPIRATORY_TRACT | 2 refills | Status: AC | PRN

## 2021-12-09 NOTE — ED Triage Notes (Signed)
FIRST NURSE NOTE:  ?Pt via EMS from home. Pt c/o SOB and fatigue for the past couple of days. Per EMS pt was HTN with hx and has been taking her medications. Pt is A&Ox4 and NAD ? ?200/120  ?90 HR  ?98% on RA ?12 Lead Unremarkable per EMS  ?CBG 332 hx of DM II  ?20G L AC ?

## 2021-12-09 NOTE — Discharge Instructions (Signed)
Please seek medical attention for any high fevers, chest pain, shortness of breath, change in behavior, persistent vomiting, bloody stool or any other new or concerning symptoms.  

## 2021-12-09 NOTE — ED Provider Notes (Signed)
? ?[facility] ?Provider Note ? ? ? Event Date/Time  ? First MD Initiated Contact with Patient 12/09/21 1442   ?  (approximate) ? ? ?History  ? ?Chest Pain and Shortness of Breath ? ? ?HPI ? ?Kelly [name] is a 51 y.o. female  who, per PCP note dated 11/18/2021 has history of hepatitis C, DM, depression, HTN, who presents to the emergency department today because of concern for shortness of breath and chest tightness. Symptoms started a couple of weeks ago. Came in today because she has noticed some wheezing. The patient denies any significant cough. No fevers. Denies history of respiratory illness.   ? ? ?Physical Exam  ? ?Triage Vital Signs: ?ED Triage Vitals  ?Enc Vitals Group  ?   BP 12/09/21 1251 (!) 168/96  ?   Pulse Rate 12/09/21 1251 83  ?   Resp 12/09/21 1251 16  ?   Temp 12/09/21 1251 98.7 ?F (37.1 ?C)  ?   Temp Source 12/09/21 1251 Oral  ?   SpO2 12/09/21 1251 97 %  ?   Weight 12/09/21 1247 180 lb (81.6 kg)  ?   Height 12/09/21 1247 5\' 4"  (1.626 m)  ?   Head Circumference --   ?   Peak Flow --   ?   Pain Score 12/09/21 1247 9  ? ?Most recent vital signs: ?Vitals:  ? 12/09/21 1251  ?BP: (!) 168/96  ?Pulse: 83  ?Resp: 16  ?Temp: 98.7 ?F (37.1 ?C)  ?SpO2: 97%  ? ? ?General: Awake, no distress.  ?CV:  Good peripheral perfusion.  ?Resp:  Normal effort. Minimal expiratory wheezing. ?Abd:  No distention.  ? ? ?ED Results / Procedures / Treatments  ? ?Labs ?(all labs ordered are listed, but only abnormal results are displayed) ?Labs Reviewed  ?BASIC METABOLIC PANEL - Abnormal; Notable for the following components:  ?    Result Value  ? Glucose, Bld 264 (*)   ? All other components within normal limits  ?CBC  ?POC URINE PREG, ED  ?TROPONIN I (HIGH SENSITIVITY)  ? ? ? ?EKG ? ?I03/25/23, attending physician, personally viewed and interpreted this EKG ? ?EKG Time: 1248 ?Rate: 83 ?Rhythm: left axis deviation ?Axis: LAD ?Intervals: qtc 420 ?QRS: narrow ?ST changes: no st  elevation ?Impression: abnormal ekg ? ?RADIOLOGY ?I independently interpreted and visualized the cxr. My interpretation: No pneumonia/No pneumothorax. ?Radiology interpretation:  ?IMPRESSION:  ?No active cardiopulmonary disease.  ?   ? ? ?PROCEDURES: ? ?Critical Care performed: No ? ?Procedures ? ? ?MEDICATIONS ORDERED IN ED: ?Medications - No data to display ? ? ?IMPRESSION / MDM / ASSESSMENT AND PLAN / ED COURSE  ?I reviewed the triage vital signs and the nursing notes. ?             ?               ? ?Differential diagnosis includes, but is not limited to, bronchitis, pneumonia, anemia.  ? ?Patient presented to the emergency department today because of concern for shortness of breath. On exam patient did have some wheezing. Blood work without any concerning anemia or electrolyte abnormality. CXR without pneumonia/edema/ptx. Patient did feel better after duoneb treatment. At this time given patient feels improved will plan on discharging home with prescription for albuterol inhaler. Encouraged primary care follow up. ? ?FINAL CLINICAL IMPRESSION(S) / ED DIAGNOSES  ? ?Final diagnoses:  ?Shortness of breath  ? ? ? ?Rx / DC Orders  ? ?  ED Discharge Orders   ? ?      Ordered  ?  albuterol (VENTOLIN HFA) 108 (90 Base) MCG/ACT inhaler  Every 6 hours PRN       ? 12/09/21 1644  ? ?  ?  ? ?  ? ? ? ?Note:  This document was prepared using Dragon voice recognition software and may include unintentional dictation errors. ? ?  ?Phineas Semen, MD ?12/09/21 1648 ? ?

## 2021-12-21 ENCOUNTER — Other Ambulatory Visit: Payer: Self-pay

## 2021-12-21 ENCOUNTER — Telehealth: Payer: Self-pay

## 2021-12-21 DIAGNOSIS — Z1211 Encounter for screening for malignant neoplasm of colon: Secondary | ICD-10-CM

## 2021-12-21 NOTE — Telephone Encounter (Signed)
Return call made to patient to reschedule her colonoscopy per her request.  Colonoscopy was originally scheduled for today with Dr.  Servando Snare.  Her procedure has been rescheduled with Dr. Servando Snare for Tuesday 02/08/22.  She said she still has her bowel prep and previous instructions but I will send her a new copy. ? ?Thanks, ?Marcelino Duster ?

## 2022-02-08 ENCOUNTER — Encounter: Admission: RE | Payer: Self-pay | Source: Home / Self Care

## 2022-02-08 ENCOUNTER — Ambulatory Visit: Admission: RE | Admit: 2022-02-08 | Payer: Medicaid Other | Source: Home / Self Care | Admitting: Gastroenterology

## 2022-02-08 SURGERY — COLONOSCOPY WITH PROPOFOL
Anesthesia: General

## 2022-02-18 ENCOUNTER — Ambulatory Visit: Payer: Medicaid Other | Admitting: Internal Medicine

## 2022-04-05 NOTE — Progress Notes (Deleted)
   Established Patient Office Visit  Subjective   Patient ID: Kelly [name], female    DOB: 1971-04-09  Age: 51 y.o. MRN: [id number]  No chief complaint on file.   HPI  {History (Optional):23778}  ROS    Objective:     There were no vitals taken for this visit. {Vitals History (Optional):23777}  Physical Exam   No results found for any visits on 04/06/22.  {Labs (Optional):23779}  The 10-year ASCVD risk score (Arnett DK, et al., 2019) is: 16.6%    Assessment & Plan:   Problem List Items Addressed This Visit   None   No follow-ups on file.    Margarita Mail, DO

## 2022-04-06 ENCOUNTER — Ambulatory Visit: Payer: Medicaid Other | Admitting: Internal Medicine

## 2022-05-02 ENCOUNTER — Ambulatory Visit: Payer: Medicaid Other | Admitting: Internal Medicine

## 2022-05-02 ENCOUNTER — Other Ambulatory Visit (HOSPITAL_COMMUNITY)
Admission: RE | Admit: 2022-05-02 | Discharge: 2022-05-02 | Disposition: A | Discharge status: Home / Self Care | Payer: Medicaid Other | Source: Ambulatory Visit | Attending: Internal Medicine | Admitting: Internal Medicine

## 2022-05-02 ENCOUNTER — Other Ambulatory Visit: Payer: Self-pay

## 2022-05-02 ENCOUNTER — Encounter: Payer: Self-pay | Admitting: Internal Medicine

## 2022-05-02 VITALS — BP 134/72 | HR 89 | Temp 97.8°F | Resp 18 | Ht 64.0 in | Wt 180.5 lb

## 2022-05-02 DIAGNOSIS — Z113 Encounter for screening for infections with a predominantly sexual mode of transmission: Secondary | ICD-10-CM | POA: Insufficient documentation

## 2022-05-02 DIAGNOSIS — A599 Trichomoniasis, unspecified: Secondary | ICD-10-CM

## 2022-05-02 NOTE — Patient Instructions (Signed)
It was great seeing you today!  Plan discussed at today's visit: -Blood work ordered today, results will be uploaded to MyChart.   Follow up in: as needed.   Take care and let us know if you have any questions or concerns prior to your next visit.  Dr. Cydnee Fuquay  

## 2022-05-02 NOTE — Progress Notes (Signed)
   Acute Office Visit  Subjective:     Patient ID: Kelly [name], female    DOB: October 14, 1970, 51 y.o.   MRN: [id number]  Chief Complaint  Patient presents with   Exposure to STD    HPI Patient is in today for STD testing.  STD SCREENING Sexual activity:   sexually active with one female partner but not monogamous  Contraception: no - history of hysterectomy  Recent unprotected intercourse: yes History of sexually transmitted diseases: yes Previous sexually transmitted disease screening: yes Genital lesions: no Vaginal discharge: yes; thick brown/yellow with foul odor  Dysuria: no Swollen lymph nodes: no Fevers: no Rash: no   Review of Systems  All other systems reviewed and are negative.     Objective:    BP 134/72   Pulse 89   Temp 97.8 F (36.6 C) (Oral)   Resp 18   Ht 5\' 4"  (1.626 m)   Wt 180 lb 8 oz (81.9 kg)   SpO2 99%   BMI 30.98 kg/m  BP Readings from Last 3 Encounters:  05/02/22 134/72  12/09/21 (!) 160/88  11/18/21 132/84   Wt Readings from Last 3 Encounters:  05/02/22 180 lb 8 oz (81.9 kg)  12/09/21 180 lb (81.6 kg)  11/18/21 187 lb (84.8 kg)      Physical Exam Constitutional:      Appearance: Normal appearance.  HENT:     Head: Normocephalic and atraumatic.  Eyes:     Conjunctiva/sclera: Conjunctivae normal.  Cardiovascular:     Rate and Rhythm: Normal rate and regular rhythm.  Pulmonary:     Effort: Pulmonary effort is normal.     Breath sounds: Normal breath sounds.  Skin:    General: Skin is warm and dry.  Neurological:     General: No focal deficit present.     Mental Status: She is alert. Mental status is at baseline.  Psychiatric:        Mood and Affect: Mood normal.        Behavior: Behavior normal.     No results found for any visits on 05/02/22.      Assessment & Plan:   1. Screening examination for STD (sexually transmitted disease): Screen for Gonorrhea/Chlamydia, BV/trichomonas, yeast infection, HIV and  syphilis today.   - Cervicovaginal ancillary only - HIV antibody (with reflex) - RPR  Return if symptoms worsen or fail to improve.  05/04/22, DO

## 2022-05-03 LAB — CERVICOVAGINAL ANCILLARY ONLY
Bacterial Vaginitis (gardnerella): NEGATIVE
Candida Glabrata: NEGATIVE
Candida Vaginitis: NEGATIVE
Chlamydia: NEGATIVE
Comment: NEGATIVE
Comment: NEGATIVE
Comment: NEGATIVE
Comment: NEGATIVE
Comment: NEGATIVE
Comment: NORMAL
Neisseria Gonorrhea: NEGATIVE
Trichomonas: POSITIVE — AB

## 2022-05-03 LAB — RPR: RPR Ser Ql: NONREACTIVE

## 2022-05-03 LAB — HIV ANTIBODY (ROUTINE TESTING W REFLEX): HIV 1&2 Ab, 4th Generation: NONREACTIVE

## 2022-05-03 MED ORDER — METRONIDAZOLE 500 MG PO TABS
500.0000 mg | ORAL_TABLET | Freq: Two times a day (BID) | ORAL | 0 refills | Status: AC
Start: 2022-05-03 — End: 2022-05-10

## 2022-05-03 NOTE — Addendum Note (Signed)
Addended by: Margarita Mail on: 05/03/2022 02:15 PM   Modules accepted: Orders

## 2022-09-01 NOTE — Progress Notes (Signed)
Acute Office Visit  Subjective:     Patient ID: Kelly [name], female    DOB: 24-Jan-1971, 51 y.o.   MRN: [id number]  Chief Complaint  Patient presents with   Vaginal Discharge    Green w/ odor    HPI Patient is in today for change in vaginal discharge.   VAGINAL DISCHARGE Duration:  1 week  Discharge description:  green thick   Pruritus: no Dysuria: no Malodorous: yes Urinary frequency: no Fevers: no Abdominal pain: no  Sexual activity: concerned about STIs-has had 2 female partners in the last 12 months, does not use condoms History of sexually transmitted diseases: yes -was treated for trichomonas in August of this year Recent antibiotic use: no, recent in August  Context: worse    Review of Systems  Constitutional:  Negative for chills and fever.  Eyes:  Negative for blurred vision.  Cardiovascular:  Negative for chest pain.  Gastrointestinal:  Negative for abdominal pain.  Genitourinary:  Negative for dysuria, flank pain, frequency and urgency.       Objective:    BP (!) 142/82   Pulse 92   Temp 98.1 F (36.7 C)   Resp 18   Ht 5\' 4"  (1.626 m)   Wt 180 lb 4.8 oz (81.8 kg)   SpO2 100%   BMI 30.95 kg/m    Physical Exam Constitutional:      Appearance: Normal appearance.  HENT:     Head: Normocephalic and atraumatic.  Eyes:     Conjunctiva/sclera: Conjunctivae normal.  Cardiovascular:     Rate and Rhythm: Normal rate and regular rhythm.  Pulmonary:     Effort: Pulmonary effort is normal.     Breath sounds: Normal breath sounds.  Abdominal:     Tenderness: There is no right CVA tenderness or left CVA tenderness.  Musculoskeletal:     Right lower leg: No edema.     Left lower leg: No edema.  Skin:    General: Skin is warm and dry.  Neurological:     General: No focal deficit present.     Mental Status: She is alert. Mental status is at baseline.  Psychiatric:        Mood and Affect: Mood normal.        Behavior: Behavior normal.      Results for orders placed or performed in visit on 09/02/22  POCT Urinalysis Dipstick  Result Value Ref Range   Color, UA yellow    Clarity, UA clody    Glucose, UA Positive (A) Negative   Bilirubin, UA neg    Ketones, UA neg    Spec Grav, UA 1.025 1.010 - 1.025   Blood, UA positive    pH, UA 6.0 5.0 - 8.0   Protein, UA Positive (A) Negative   Urobilinogen, UA 0.2 0.2 or 1.0 E.U./dL   Nitrite, UA positive    Leukocytes, UA Large (3+) (A) Negative   Appearance cloudy    Odor normal         Assessment & Plan:   1. Urinary tract infection with hematuria, site unspecified: UA in the office positive for nitrates, 3+ leukocytes, blood and glucose.  Will send urine for culture and empirically treat with Bactrim twice daily x 3 days.  - Urine Culture - sulfamethoxazole-trimethoprim (BACTRIM DS) 800-160 MG tablet; Take 1 tablet by mouth 2 (two) times daily for 3 days.  Dispense: 6 tablet; Refill: 0  2. Vaginal discharge: Cervical swab pending. - POCT Urinalysis Dipstick -  Cervicovaginal ancillary only  3. Need for influenza vaccination: Flu vaccine administered today.  - Flu Vaccine QUAD 6+ mos PF IM (Fluarix Quad PF)   Return in about 4 weeks (around 09/30/2022).  For follow-up on chronic medical conditions  Teodora Medici, DO

## 2022-09-02 ENCOUNTER — Ambulatory Visit (INDEPENDENT_AMBULATORY_CARE_PROVIDER_SITE_OTHER): Payer: Medicaid Other | Admitting: Internal Medicine

## 2022-09-02 ENCOUNTER — Encounter: Payer: Self-pay | Admitting: Internal Medicine

## 2022-09-02 ENCOUNTER — Other Ambulatory Visit (HOSPITAL_COMMUNITY)
Admission: RE | Admit: 2022-09-02 | Discharge: 2022-09-02 | Disposition: A | Discharge status: Home / Self Care | Payer: Medicaid Other | Source: Ambulatory Visit | Attending: Internal Medicine | Admitting: Internal Medicine

## 2022-09-02 VITALS — BP 142/82 | HR 92 | Temp 98.1°F | Resp 18 | Ht 64.0 in | Wt 180.3 lb

## 2022-09-02 DIAGNOSIS — N898 Other specified noninflammatory disorders of vagina: Secondary | ICD-10-CM | POA: Diagnosis present

## 2022-09-02 DIAGNOSIS — N390 Urinary tract infection, site not specified: Secondary | ICD-10-CM | POA: Diagnosis not present

## 2022-09-02 DIAGNOSIS — R319 Hematuria, unspecified: Secondary | ICD-10-CM

## 2022-09-02 DIAGNOSIS — B9689 Other specified bacterial agents as the cause of diseases classified elsewhere: Secondary | ICD-10-CM

## 2022-09-02 DIAGNOSIS — Z23 Encounter for immunization: Secondary | ICD-10-CM | POA: Diagnosis not present

## 2022-09-02 DIAGNOSIS — A599 Trichomoniasis, unspecified: Secondary | ICD-10-CM | POA: Diagnosis not present

## 2022-09-02 DIAGNOSIS — N760 Acute vaginitis: Secondary | ICD-10-CM

## 2022-09-02 LAB — POCT URINALYSIS DIPSTICK
Bilirubin, UA: NEGATIVE
Blood, UA: POSITIVE
Glucose, UA: POSITIVE — AB
Ketones, UA: NEGATIVE
Nitrite, UA: POSITIVE
Odor: NORMAL
Protein, UA: POSITIVE — AB
Spec Grav, UA: 1.025 (ref 1.010–1.025)
Urobilinogen, UA: 0.2 E.U./dL
pH, UA: 6 (ref 5.0–8.0)

## 2022-09-02 MED ORDER — SULFAMETHOXAZOLE-TRIMETHOPRIM 800-160 MG PO TABS: 1.0000 | ORAL_TABLET | Freq: Two times a day (BID) | ORAL | 0 refills | Status: AC

## 2022-09-02 NOTE — Patient Instructions (Signed)
It was great seeing you today!  Plan discussed at today's visit: -Antibitoic for UTI ordered today, take 1 pill twice a day for 3 days -Urine culture and swab results pending -Flu vaccine today  Follow up in: 1 month for chronic medical conditions   Take care and let us know if you have any questions or concerns prior to your next visit.  Dr. Caralee Ates

## 2022-09-04 LAB — URINE CULTURE
MICRO NUMBER:: 14321642
SPECIMEN QUALITY:: ADEQUATE

## 2022-09-06 LAB — CERVICOVAGINAL ANCILLARY ONLY
Bacterial Vaginitis (gardnerella): POSITIVE — AB
Candida Glabrata: NEGATIVE
Candida Vaginitis: NEGATIVE
Chlamydia: NEGATIVE
Comment: NEGATIVE
Comment: NEGATIVE
Comment: NEGATIVE
Comment: NEGATIVE
Comment: NEGATIVE
Comment: NORMAL
Neisseria Gonorrhea: NEGATIVE
Trichomonas: POSITIVE — AB

## 2022-09-06 MED ORDER — METRONIDAZOLE 500 MG PO TABS
500.0000 mg | ORAL_TABLET | Freq: Two times a day (BID) | ORAL | 0 refills | Status: AC
Start: 2022-09-06 — End: 2022-09-13

## 2022-09-06 NOTE — Addendum Note (Signed)
Addended by: Margarita Mail on: 09/06/2022 02:28 PM   Modules accepted: Orders

## 2022-09-27 ENCOUNTER — Ambulatory Visit: Payer: Medicaid Other | Admitting: Internal Medicine

## 2022-09-30 ENCOUNTER — Ambulatory Visit: Payer: Medicaid Other | Admitting: Internal Medicine

## 2022-10-02 NOTE — Progress Notes (Deleted)
   Established Patient Office Visit  Subjective   Patient ID: Kelly [name], female    DOB: 02/20/1971  Age: 51 y.o. MRN: [id number]  No chief complaint on file.   HPI  Patient here for follow up on chronic medical conditions and STD test for cure.   Hypertension:  -Medications: HCTZ 12.5 mg, Lisinopril 40 mg -Patient is compliant with above medications and reports no side effects. -Checking BP at home (average): *** -Highest BP at home: *** -Lowest BP at home: *** -Denies any SOB, CP, vision changes, LE edema or symptoms of hypotension -Diet: *** -Exercise: ***  HLD:  -Medications: Crestor 10 mg -Patient is compliant with above medications and reports no side effects. *** -Last lipid panel: Lipid Panel     Component Value Date/Time   CHOL 202 (H) 11/18/2021 1425   TRIG 124 11/18/2021 1425   HDL 53 11/18/2021 1425   CHOLHDL 3.8 11/18/2021 1425   VLDL 37 06/15/2019 0614   LDLCALC 126 (H) 11/18/2021 1425   Diabetes, Type 2: -Last A1c 10.6 10/23 -Medications: Victoza 0.6 mg, Levemir 24-50 units, Novolog 6 units TID -Patient is compliant with the above medications and reports no side effects. *** -Checking BG at home: *** -Fasting home BG: *** -Post-prandial home BG: *** -Highest home BG since last visit: *** -Lowest home BG since last visit: *** -Diet: *** -Exercise: *** -Eye exam: *** -Foot exam: Due -Microalbumin: Due -Statin: *** -PNA vaccine: *** -Denies symptoms of hypoglycemia, polyuria, polydipsia, numbness extremities, foot ulcers/trauma. ***   {History (Optional):23778}  ROS    Objective:     There were no vitals taken for this visit. {Vitals History (Optional):23777}  Physical Exam   No results found for any visits on 10/03/22.  {Labs (Optional):23779}  The 10-year ASCVD risk score (Arnett DK, et al., 2019) is: 14.4%    Assessment & Plan:   Problem List Items Addressed This Visit   None   No follow-ups on file.     Muhamad Serano, DO 

## 2022-10-03 ENCOUNTER — Ambulatory Visit: Payer: Medicaid Other | Admitting: Internal Medicine

## 2022-10-03 ENCOUNTER — Telehealth: Payer: Self-pay | Admitting: Internal Medicine

## 2022-10-03 NOTE — Telephone Encounter (Signed)
Copied from [place] [id number]. Topic: General - Inquiry >> Oct 03, 2022  9:24 AM Penni Bombard wrote: Reason for CRM: pt called asking when was her last flu shot.    [phone]

## 2022-10-03 NOTE — Telephone Encounter (Signed)
Pt notified 

## 2022-10-04 ENCOUNTER — Telehealth: Payer: Self-pay | Admitting: Internal Medicine

## 2022-10-04 NOTE — Telephone Encounter (Signed)
Printed and placed up front .

## 2022-10-04 NOTE — Telephone Encounter (Signed)
Copied from [place] [phone]. Topic: General - Inquiry >> Oct 04, 2022  8:19 AM Penni Bombard wrote: Reason for CRM: .  pt called wanting a copy of her flu shot.  She will come by the office and pick it up  CB#  [phone]

## 2022-10-10 NOTE — Progress Notes (Deleted)
   Established Patient Office Visit  Subjective   Patient ID: Kelly [name], female    DOB: Aug 12, 1971  Age: 52 y.o. MRN: [id number]  No chief complaint on file.   HPI  Patient here for follow up on chronic medical conditions and STD test for cure.   Hypertension:  -Medications: HCTZ 12.5 mg, Lisinopril 40 mg -Patient is compliant with above medications and reports no side effects. -Checking BP at home (average): *** -Highest BP at home: *** -Lowest BP at home: *** -Denies any SOB, CP, vision changes, LE edema or symptoms of hypotension -Diet: *** -Exercise: ***  HLD:  -Medications: Crestor 10 mg -Patient is compliant with above medications and reports no side effects. *** -Last lipid panel: Lipid Panel     Component Value Date/Time   CHOL 202 (H) 11/18/2021 1425   TRIG 124 11/18/2021 1425   HDL 53 11/18/2021 1425   CHOLHDL 3.8 11/18/2021 1425   VLDL 37 06/15/2019 0614   LDLCALC 126 (H) 11/18/2021 1425   Diabetes, Type 2: -Last A1c 10.6 10/23 -Medications: Victoza 0.6 mg, Levemir 24-50 units, Novolog 6 units TID -Patient is compliant with the above medications and reports no side effects. *** -Checking BG at home: *** -Fasting home BG: *** -Post-prandial home BG: *** -Highest home BG since last visit: *** -Lowest home BG since last visit: *** -Diet: *** -Exercise: *** -Eye exam: *** -Foot exam: Due -Microalbumin: Due -Statin: *** -PNA vaccine: *** -Denies symptoms of hypoglycemia, polyuria, polydipsia, numbness extremities, foot ulcers/trauma. ***   {History (Optional):23778}  ROS    Objective:     There were no vitals taken for this visit. {Vitals History (Optional):23777}  Physical Exam   No results found for any visits on 10/03/22.  {Labs (Optional):23779}  The 10-year ASCVD risk score (Arnett DK, et al., 2019) is: 14.4%    Assessment & Plan:   Problem List Items Addressed This Visit   None   No follow-ups on file.     Teodora Medici, DO

## 2022-10-11 ENCOUNTER — Ambulatory Visit: Payer: Medicaid Other | Admitting: Internal Medicine

## 2022-11-11 ENCOUNTER — Emergency Department: Payer: Medicaid Other

## 2022-11-11 ENCOUNTER — Other Ambulatory Visit: Payer: Self-pay

## 2022-11-11 ENCOUNTER — Emergency Department
Admission: EM | Admit: 2022-11-11 | Discharge: 2022-11-11 | Disposition: A | Discharge status: Home / Self Care | Payer: Medicaid Other | Attending: Emergency Medicine | Admitting: Emergency Medicine

## 2022-11-11 DIAGNOSIS — I10 Essential (primary) hypertension: Secondary | ICD-10-CM | POA: Insufficient documentation

## 2022-11-11 DIAGNOSIS — M25461 Effusion, right knee: Secondary | ICD-10-CM

## 2022-11-11 DIAGNOSIS — M25561 Pain in right knee: Secondary | ICD-10-CM | POA: Diagnosis present

## 2022-11-11 DIAGNOSIS — E119 Type 2 diabetes mellitus without complications: Secondary | ICD-10-CM | POA: Diagnosis not present

## 2022-11-11 MED ORDER — MELOXICAM 15 MG PO TABS: 15.0000 mg | ORAL_TABLET | Freq: Every day | ORAL | 2 refills | Status: DC

## 2022-11-11 NOTE — ED Triage Notes (Signed)
Pt here with right knee pain. Pt denies fall or injury but states knee started swelling on its own. Pt states it hurts more when she puts weight on it.

## 2022-11-11 NOTE — ED Provider Notes (Signed)
[facility] Provider Note    Event Date/Time   First MD Initiated Contact with Patient 11/11/22 [phone]     (approximate)   History   Knee Pain   HPI  Kelly [name] is a 52 y.o. female with history of diabetes, hypertension presents emergency department with concerns of right knee pain.  Patient states she felt a pop.  Had a large bowel swelling.  Symptoms started yesterday.  Hurts more when she bears weight.  Denies numbness or tingling.  No history of gout      Physical Exam   Triage Vital Signs: ED Triage Vitals  Enc Vitals Group     BP 11/11/22 0845 (!) 166/103     Pulse Rate 11/11/22 0845 93     Resp 11/11/22 0845 20     Temp 11/11/22 0844 97.7 F (36.5 C)     Temp Source 11/11/22 0844 Oral     SpO2 11/11/22 0845 100 %     Weight 11/11/22 0844 180 lb 5.4 oz (81.8 kg)     Height 11/11/22 0844 '5\' 4"'$  (1.626 m)     Head Circumference --      Peak Flow --      Pain Score 11/11/22 0844 8     Pain Loc --      Pain Edu? --      Excl. in [place]? --     Most recent vital signs: Vitals:   11/11/22 0844 11/11/22 0845  BP:  (!) 166/103  Pulse:  93  Resp:  20  Temp: 97.7 F (36.5 C) 97.7 F (36.5 C)  SpO2:  100%     General: Awake, no distress.   CV:  Good peripheral perfusion. regular rate and  rhythm Resp:  Normal effort.  Abd:  No distention.   Other:  Right knee has swelling, no redness, decreased range of motion secondary discomfort, neurovascular is intact   ED Results / Procedures / Treatments   Labs (all labs ordered are listed, but only abnormal results are displayed) Labs Reviewed - No data to display   EKG     RADIOLOGY X-ray of the right knee    PROCEDURES:   Procedures   MEDICATIONS ORDERED IN ED: Medications - No data to display   IMPRESSION / MDM / [place] / ED COURSE  I reviewed the triage vital signs and the nursing notes.                              Differential diagnosis  includes, but is not limited to, sprain, effusion, internal derangement  Patient's presentation is most consistent with acute complicated illness / injury requiring diagnostic workup.   X-ray of the right knee  The x-ray of the right knee shows a small effusion.  I did review the radiologist report and interpreted this as remaining the same with small effusion.  I did explain this finding to the patient.  She is placed in a knee immobilizer.  With her being diabetic I advised her to try conservative measures prior to having an arthrocentesis.  She is in agreement treatment plan.  She is to apply ice.  Knee immobilizer was applied.  She was given meloxicam.  Given a work note so she can stay off of the knee for few days.  She was encouraged to follow-up with orthopedics if continued swelling in the knee on Monday or Tuesday.  FINAL CLINICAL IMPRESSION(S) / ED DIAGNOSES   Final diagnoses:  Effusion of right knee     Rx / DC Orders   ED Discharge Orders          Ordered    meloxicam (MOBIC) 15 MG tablet  Daily        11/11/22 0951             Note:  This document was prepared using Dragon voice recognition software and may include unintentional dictation errors.    Versie Starks, PA-C 11/11/22 1110    Harvest Dark, MD 11/11/22 [phone]

## 2022-11-11 NOTE — Discharge Instructions (Signed)
Wear the knee immobilizer while up and walking, apply ice is much as possible.   Take the meloxicam for pain and swelling.  May also take Tylenol.   Do not take additional ibuprofen or Aleve. Follow-up with orthopedics if not improving by Monday.  They will be able to pull the fluid off with a needle

## 2022-12-28 ENCOUNTER — Other Ambulatory Visit: Payer: Self-pay | Admitting: Pharmacist

## 2022-12-28 NOTE — Progress Notes (Signed)
   12/28/2022  Patient ID: Kelly [name], female   DOB: 21-Mar-1971, 52 y.o.   MRN: [id number]  Coordination of Care Call  Patient appearing on outreach list as has Levemir insulin currently listed on his medication list in Epic. Note Novo Nordisk is discontinuing Levemir insulin in 2024 (FlexPen discontinued as of 12/19/2022).   From review of chart, note patient last seen in PCP office on 09/02/2022 and advised to return for follow up 4 weeks later. No next appointment currently scheduled with PCP.    From review of dispensing history in chart, appears Levemir insulin last filled on 04/14/2020.  Reach patient by telephone today.   Denies taking Levemir insulin. Patient denies recently monitoring home blood sugar. Counsel on importance of blood sugar control and monitoring.  Encourage patient to contact office today to schedule appointment with PCP.   Plan:   1) Patient to contact office to schedule follow up appointment with PCP   2) Provide patient with contact information for clinical pharmacist to contact if interested in future for medication questions/concerns   Estelle Grumbles, PharmD, [facility] [phone]

## 2023-01-11 NOTE — Progress Notes (Unsigned)
   Acute Office Visit  Subjective:     Patient ID: Kelly [name], female    DOB: 05-27-1971, 52 y.o.   MRN: [id number]  No chief complaint on file.   HPI Patient is in today for pain in feet.   FOOT PAIN Duration: {Blank single:19197::"chronic","days","weeks","months"} Involved foot: {Blank single:19197::"left","right","bilateral"} Mechanism of injury: {Blank single:19197::"trauma","unknown"} Location:  Onset: {Blank single:19197::"sudden","gradual"}  Severity: {Blank single:19197::"mild","moderate","severe","1/10","2/10","3/10","4/10","5/10","6/10","7/10","8/10","9/10","10/10"}  Quality:  {Blank multiple:19196::"sharp","dull","aching","burning","cramping","ill-defined","itchy","pressure-like","pulling","shooting","sore","stabbing","tender","tearing","throbbing"} Frequency: {Blank single:19197::"constant","intermittent","occasional","rare","every few minutes","a few times a hour","a few times a day","a few times a week","a few times a month","a few times a year"} Radiation: {Blank single:19197::"yes","no"} Aggravating factors: {Blank multiple:19196::"weight bearing","walking","running","stairs","bending","movement","prolonged sitting"}  Alleviating factors: {Blank multiple:19196::"nothing","ice","physical therapy","HEP","APAP","NSAIDs","brace","crutches","rest"}  Status: {Blank multiple:19196::"better","worse","stable","fluctuating"} Treatments attempted: {Blank multiple:19196::"none","rest","ice","heat","APAP","ibuprofen","aleve","physical therapy","HEP"}  Relief with NSAIDs?:  {Blank single:19197::"No NSAIDs Taken","no","mild","moderate","significant"} Weakness with weight bearing or walking: {Blank single:19197::"yes","no"} Morning stiffness: {Blank single:19197::"yes","no"} Swelling: {Blank single:19197::"yes","no"} Redness: {Blank single:19197::"yes","no"} Bruising: {Blank single:19197::"yes","no"} Paresthesias / decreased sensation: {Blank single:19197::"yes","no"}   Fevers:{Blank single:19197::"yes","no"}  Diabetes, Type 2:  -Last A1c 10/23 10.6% -Medications: *** -Patient is compliant with the above medications and reports no side effects. *** -Checking BG at home: *** -Fasting home BG: *** -Post-prandial home BG: *** -Highest home BG since last visit: *** -Lowest home BG since last visit: *** -Diet: *** -Exercise: *** -Eye exam: *** -Foot exam: *** -Microalbumin: *** -Statin: *** -PNA vaccine: *** -Denies symptoms of hypoglycemia, polyuria, polydipsia, numbness extremities, foot ulcers/trauma. ***     ROS      Objective:    There were no vitals taken for this visit. {Vitals History (Optional):23777}  Physical Exam  No results found for any visits on 01/12/23.      Assessment & Plan:   Problem List Items Addressed This Visit   None   No orders of the defined types were placed in this encounter.   No follow-ups on file.  Margarita Mail, DO

## 2023-01-12 ENCOUNTER — Ambulatory Visit: Payer: Medicaid Other | Admitting: Internal Medicine

## 2023-01-12 ENCOUNTER — Other Ambulatory Visit (HOSPITAL_COMMUNITY)
Admission: RE | Admit: 2023-01-12 | Discharge: 2023-01-12 | Disposition: A | Discharge status: Home / Self Care | Payer: Medicaid Other | Source: Ambulatory Visit | Attending: Internal Medicine | Admitting: Internal Medicine

## 2023-01-12 ENCOUNTER — Encounter: Payer: Self-pay | Admitting: Internal Medicine

## 2023-01-12 VITALS — BP 136/80 | HR 95 | Temp 98.0°F | Resp 16 | Ht 64.0 in | Wt 181.8 lb

## 2023-01-12 DIAGNOSIS — F331 Major depressive disorder, recurrent, moderate: Secondary | ICD-10-CM | POA: Diagnosis not present

## 2023-01-12 DIAGNOSIS — F419 Anxiety disorder, unspecified: Secondary | ICD-10-CM | POA: Diagnosis not present

## 2023-01-12 DIAGNOSIS — N898 Other specified noninflammatory disorders of vagina: Secondary | ICD-10-CM | POA: Diagnosis not present

## 2023-01-12 DIAGNOSIS — E1165 Type 2 diabetes mellitus with hyperglycemia: Secondary | ICD-10-CM

## 2023-01-12 DIAGNOSIS — G629 Polyneuropathy, unspecified: Secondary | ICD-10-CM | POA: Diagnosis not present

## 2023-01-12 DIAGNOSIS — I10 Essential (primary) hypertension: Secondary | ICD-10-CM

## 2023-01-12 DIAGNOSIS — N3000 Acute cystitis without hematuria: Secondary | ICD-10-CM

## 2023-01-12 DIAGNOSIS — R300 Dysuria: Secondary | ICD-10-CM

## 2023-01-12 DIAGNOSIS — B379 Candidiasis, unspecified: Secondary | ICD-10-CM

## 2023-01-12 LAB — POCT GLYCOSYLATED HEMOGLOBIN (HGB A1C): Hemoglobin A1C: 10.5 % — AB (ref 4.0–5.6)

## 2023-01-12 LAB — POCT URINALYSIS DIPSTICK
Bilirubin, UA: NEGATIVE
Blood, UA: NEGATIVE
Glucose, UA: POSITIVE — AB
Ketones, UA: NEGATIVE
Nitrite, UA: NEGATIVE
Odor: NORMAL
Protein, UA: NEGATIVE
Spec Grav, UA: 1.02 (ref 1.010–1.025)
Urobilinogen, UA: 0.2 E.U./dL
pH, UA: 6 (ref 5.0–8.0)

## 2023-01-12 MED ORDER — ESCITALOPRAM OXALATE 5 MG PO TABS
5.0000 mg | ORAL_TABLET | Freq: Every day | ORAL | 1 refills | Status: DC
Start: 2023-01-12 — End: 2023-02-07

## 2023-01-12 MED ORDER — LISINOPRIL 40 MG PO TABS
40.0000 mg | ORAL_TABLET | Freq: Every day | ORAL | 1 refills | Status: DC
Start: 2023-01-12 — End: 2024-01-16

## 2023-01-12 MED ORDER — TIRZEPATIDE 2.5 MG/0.5ML ~~LOC~~ SOAJ
2.5000 mg | SUBCUTANEOUS | 0 refills | Status: DC
Start: 2023-01-12 — End: 2023-01-13

## 2023-01-12 MED ORDER — HYDROCHLOROTHIAZIDE 12.5 MG PO TABS
12.5000 mg | ORAL_TABLET | Freq: Every day | ORAL | 1 refills | Status: DC
Start: 2023-01-12 — End: 2024-02-20

## 2023-01-12 MED ORDER — GABAPENTIN 100 MG PO CAPS: 100.0000 mg | ORAL_CAPSULE | Freq: Two times a day (BID) | ORAL | 0 refills | Status: DC

## 2023-01-12 MED ORDER — HYDROXYZINE HCL 10 MG PO TABS
10.0000 mg | ORAL_TABLET | Freq: Three times a day (TID) | ORAL | 1 refills | Status: DC | PRN
Start: 2023-01-12 — End: 2024-07-16

## 2023-01-12 NOTE — Patient Instructions (Signed)
It was great seeing you today!  Plan discussed at today's visit: -A1c today 10.5% which is elevated and can increase nerve damage which can lead to infection and amputation - I am sending Gabapentin in for foot pain but the real treatment is better control of the diabetes -I am sending in [place] which is a weekly injection to help get A1c better controlled -Blood pressure medication refilled -Start Lexapro 5 mg daily for depression and can use Hydroxyzine as needed for anxiety as well   Follow up in: 1 month   Take care and let us know if you have any questions or concerns prior to your next visit.  Dr. Caralee Ates

## 2023-01-13 ENCOUNTER — Other Ambulatory Visit: Payer: Self-pay | Admitting: Internal Medicine

## 2023-01-13 ENCOUNTER — Telehealth: Payer: Self-pay | Admitting: Internal Medicine

## 2023-01-13 DIAGNOSIS — G629 Polyneuropathy, unspecified: Secondary | ICD-10-CM

## 2023-01-13 DIAGNOSIS — E1165 Type 2 diabetes mellitus with hyperglycemia: Secondary | ICD-10-CM

## 2023-01-13 LAB — MICROALBUMIN / CREATININE URINE RATIO
Creatinine, Urine: 59 mg/dL (ref 20–275)
Microalb Creat Ratio: 363 mg/g creat — ABNORMAL HIGH (ref <30)
Microalb, Ur: 21.4 mg/dL

## 2023-01-13 MED ORDER — TRULICITY 0.75 MG/0.5ML ~~LOC~~ SOAJ
0.7500 mg | SUBCUTANEOUS | 0 refills | Status: DC
Start: 2023-01-13 — End: 2023-02-16

## 2023-01-13 MED ORDER — RYBELSUS 3 MG PO TABS
3.0000 mg | ORAL_TABLET | Freq: Every day | ORAL | 1 refills | Status: DC
Start: 2023-01-13 — End: 2023-01-13

## 2023-01-13 NOTE — Telephone Encounter (Signed)
Pt.notified

## 2023-01-13 NOTE — Telephone Encounter (Signed)
Trulicity and ozempic are preferred

## 2023-01-13 NOTE — Telephone Encounter (Signed)
Pt is calling in  because the pharmacy told her that tirzepatide [facility]) 2.5 MG/0.5ML Pen [[id number]] wasn't covered by her insurance and wanted to know was it an alternative she could take or any way to get it approved. Please follow up with pt.

## 2023-01-14 LAB — URINE CULTURE
MICRO NUMBER:: 14877110
SPECIMEN QUALITY:: ADEQUATE

## 2023-01-15 MED ORDER — SULFAMETHOXAZOLE-TRIMETHOPRIM 800-160 MG PO TABS
1.0000 | ORAL_TABLET | Freq: Two times a day (BID) | ORAL | 0 refills | Status: AC
Start: 2023-01-15 — End: 2023-01-18

## 2023-01-15 NOTE — Addendum Note (Signed)
Addended by: Margarita Mail on: 01/15/2023 05:52 PM   Modules accepted: Orders

## 2023-01-16 LAB — CERVICOVAGINAL ANCILLARY ONLY
Bacterial Vaginitis (gardnerella): NEGATIVE
Candida Glabrata: NEGATIVE
Candida Vaginitis: POSITIVE — AB
Chlamydia: NEGATIVE
Comment: NEGATIVE
Comment: NEGATIVE
Comment: NEGATIVE
Comment: NEGATIVE
Comment: NEGATIVE
Comment: NORMAL
Neisseria Gonorrhea: NEGATIVE
Trichomonas: NEGATIVE

## 2023-01-16 MED ORDER — FLUCONAZOLE 150 MG PO TABS
150.0000 mg | ORAL_TABLET | Freq: Once | ORAL | 0 refills | Status: AC
Start: 2023-01-16 — End: 2023-01-16

## 2023-01-16 NOTE — Addendum Note (Signed)
Addended by: Margarita Mail on: 01/16/2023 02:17 PM   Modules accepted: Orders

## 2023-02-04 ENCOUNTER — Other Ambulatory Visit: Payer: Self-pay | Admitting: Internal Medicine

## 2023-02-04 DIAGNOSIS — F419 Anxiety disorder, unspecified: Secondary | ICD-10-CM

## 2023-02-04 DIAGNOSIS — F331 Major depressive disorder, recurrent, moderate: Secondary | ICD-10-CM

## 2023-02-06 NOTE — Telephone Encounter (Signed)
Requested medication (s) are due for refill today - no  Requested medication (s) are on the active medication list -yes  Future visit scheduled -yes  Last refill: 01/12/23 #30 1RF  Notes to clinic: New start medication- requires changes to original Rx  Requested Prescriptions  Pending Prescriptions Disp Refills   escitalopram (LEXAPRO) 5 MG tablet [Pharmacy Med Name: ESCITALOPRAM 5 MG TABLET] 90 tablet 1    Sig: Take 1 tablet (5 mg total) by mouth daily.     Psychiatry:  Antidepressants - SSRI Passed - 02/04/2023 10:30 AM      Passed - Completed PHQ-2 or PHQ-9 in the last 360 days      Passed - Valid encounter within last 6 months    Recent Outpatient Visits           3 weeks ago Type 2 diabetes mellitus with hyperglycemia, without long-term current use of insulin [facility])   [place] [facility] Margarita Mail, DO   5 months ago Urinary tract infection with hematuria, site unspecified   [place] [facility] Margarita Mail, DO   9 months ago Screening examination for STD (sexually transmitted disease)   [place] [facility] Margarita Mail, DO   1 year ago Adult general medical exam   [facility] Margarita Mail, DO   2 years ago Adult general medical exam   [facility] Danelle Berry, PA-C       Future Appointments             In 1 week Margarita Mail, DO [place] [facility], [facility]               Requested Prescriptions  Pending Prescriptions Disp Refills   escitalopram (LEXAPRO) 5 MG tablet [Pharmacy Med Name: ESCITALOPRAM 5 MG TABLET] 90 tablet 1    Sig: Take 1 tablet (5 mg total) by mouth daily.     Psychiatry:  Antidepressants - SSRI Passed - 02/04/2023 10:30 AM      Passed - Completed PHQ-2 or PHQ-9 in the last 360 days      Passed - Valid encounter within last 6 months    Recent Outpatient Visits           3 weeks ago  Type 2 diabetes mellitus with hyperglycemia, without long-term current use of insulin [facility])   [place] [facility] Margarita Mail, DO   5 months ago Urinary tract infection with hematuria, site unspecified   [facility] Margarita Mail, DO   9 months ago Screening examination for STD (sexually transmitted disease)   [facility] Margarita Mail, DO   1 year ago Adult general medical exam   [facility] Margarita Mail, DO   2 years ago Adult general medical exam   [facility] Danelle Berry, PA-C       Future Appointments             In 1 week Margarita Mail, DO [facility], [facility]

## 2023-02-15 NOTE — Progress Notes (Unsigned)
Established Office Visit  Subjective:     Patient ID: Kelly [name], female    DOB: Oct 23, 1970, 52 y.o.   MRN: [id number]  Chief Complaint  Patient presents with   Follow-up    HPI Patient is in today for follow up. Patient having some right ankle swelling and pain for the last 3 days.  Diabetes, Type 2: -Last A1c 4/24 10.5% -Medications: Tried to prescribe Greggory Keen, was not covered, also had an insurance issue with Trulicity. Gabapentin 100 mg BID for neuropathy but not on anything for diabetes at the moment -Had been on insulin but patient non-compliant  -Checking BG at home: Not checking -Eye exam: Due -Foot exam: UTD 4/24 -Microalbumin: UTD 4/24 -Statin: uncertain  -PNA vaccine: Prevnar 23 in 2020 -Denies symptoms of hypoglycemia, polyuria, polydipsia.  Hypertension: -Medications: Lisinopril 40 mg and HCTZ 12.5 mg -Patient is compliant with above medications and reports no side effects. -Denies any SOB, CP, vision changes, LE edema or symptoms of hypotension  HLD/Hx of TIA: -Medications: Nothing, had been on Crestor 10 mg in the past -Last lipid panel: Lipid Panel     Component Value Date/Time   CHOL 202 (H) 11/18/2021 1425   TRIG 124 11/18/2021 1425   HDL 53 11/18/2021 1425   CHOLHDL 3.8 11/18/2021 1425   VLDL 37 06/15/2019 0614   LDLCALC 126 (H) 11/18/2021 1425    MDD: -Mood status: worse -Current treatment: Hydroxyzine as needed, Lexapro 5 mg added at LOV, doing well, no side effects but could be better controlled -Satisfied with current treatment?: no Previous psychiatric medications: none Depressed mood: yes Anxious mood: yes     02/16/2023    3:10 PM 01/12/2023    2:41 PM 09/02/2022    3:33 PM 05/02/2022    7:57 AM 11/18/2021    1:40 PM  Depression screen PHQ 2/9  Decreased Interest 2 0 0 0 0  Down, Depressed, Hopeless 2 0 0 1 0  PHQ - 2 Score 4 0 0 1 0  Altered sleeping 2 0 0  0  Tired, decreased energy 1 0 0  0  Change in appetite 0 0 0   0  Feeling bad or failure about yourself  0 0 0  0  Trouble concentrating 0 0 0  0  Moving slowly or fidgety/restless 0 0 0  0  Suicidal thoughts 0 0 0  0  PHQ-9 Score 7 0 0  0  Difficult doing work/chores Somewhat difficult Not difficult at all Not difficult at all  Not difficult at all      Review of Systems  Constitutional:  Negative for chills and fever.  Eyes:  Negative for blurred vision.  Respiratory:  Negative for shortness of breath.   Cardiovascular:  Negative for chest pain.        Objective:    BP (!) 148/82   Pulse 97   Temp 97.8 F (36.6 C)   Resp 16   Ht 5\' 4"  (1.626 m)   Wt 181 lb (82.1 kg)   SpO2 97%   BMI 31.07 kg/m  BP Readings from Last 3 Encounters:  02/16/23 (!) 148/82  01/12/23 136/80  11/11/22 (!) 166/103   Wt Readings from Last 3 Encounters:  02/16/23 181 lb (82.1 kg)  01/12/23 181 lb 12.8 oz (82.5 kg)  11/11/22 180 lb 5.4 oz (81.8 kg)      Physical Exam Constitutional:      Appearance: Normal appearance.  HENT:     Head:  Normocephalic and atraumatic.  Eyes:     Conjunctiva/sclera: Conjunctivae normal.  Cardiovascular:     Rate and Rhythm: Normal rate and regular rhythm.  Pulmonary:     Effort: Pulmonary effort is normal.     Breath sounds: Normal breath sounds.  Musculoskeletal:        General: Swelling and tenderness present.     Left lower leg: No edema.     Comments: Right ATF ligament pain with swelling  Skin:    General: Skin is warm and dry.  Neurological:     General: No focal deficit present.     Mental Status: She is alert. Mental status is at baseline.  Psychiatric:        Mood and Affect: Mood normal.        Behavior: Behavior normal.     No results found for any visits on 02/16/23.      Assessment & Plan:   1. Type 2 diabetes mellitus with hyperglycemia, without long-term current use of insulin (HCC)/Neuropathy: Unable to get Trulicity, will switch to Ozempic 0.25 mg weekly. Continue Gabapentin 100 mg  BID, refilled. POC glucose today 234.  - Semaglutide,0.25 or 0.5MG /DOS, (OZEMPIC, 0.25 OR 0.5 MG/DOSE,) 2 MG/3ML SOPN; Inject 0.25 mg into the skin once a week.  Dispense: 3 mL; Refill: 0 - gabapentin (NEURONTIN) 100 MG capsule; Take 1 capsule (100 mg total) by mouth 2 (two) times daily.  Dispense: 60 capsule; Refill: 0 - POCT Glucose (CBG)  2. Mixed hyperlipidemia: Restart Crestor 10 mg.  - rosuvastatin (CRESTOR) 10 MG tablet; Take 1 tablet (10 mg total) by mouth at bedtime.  Dispense: 90 tablet; Refill: 3  3. Anxiety: Increase Lexapro to 10 mg.   - escitalopram (LEXAPRO) 10 MG tablet; Take 1 tablet (10 mg total) by mouth daily.  Dispense: 90 tablet; Refill: 0  4. Encounter for screening mammogram for malignant neoplasm of breast: Mammogram ordered.   - MM 3D SCREENING MAMMOGRAM BILATERAL BREAST; Future  5. Colon cancer screening: Colon cancer screening ordered.   - Cologuard  6. Sprain of anterior talofibular ligament of right ankle, initial encounter: Recommend rest, ice, elevation. Work note given.   Return in about 4 weeks (around 03/16/2023).  Margarita Mail, DO

## 2023-02-16 ENCOUNTER — Telehealth: Payer: Self-pay | Admitting: Internal Medicine

## 2023-02-16 ENCOUNTER — Ambulatory Visit: Payer: Medicaid Other | Admitting: Internal Medicine

## 2023-02-16 ENCOUNTER — Encounter: Payer: Self-pay | Admitting: Internal Medicine

## 2023-02-16 VITALS — BP 148/82 | HR 97 | Temp 97.8°F | Resp 16 | Ht 64.0 in | Wt 181.0 lb

## 2023-02-16 DIAGNOSIS — Z7984 Long term (current) use of oral hypoglycemic drugs: Secondary | ICD-10-CM

## 2023-02-16 DIAGNOSIS — S93491A Sprain of other ligament of right ankle, initial encounter: Secondary | ICD-10-CM

## 2023-02-16 DIAGNOSIS — E782 Mixed hyperlipidemia: Secondary | ICD-10-CM | POA: Diagnosis not present

## 2023-02-16 DIAGNOSIS — Z1231 Encounter for screening mammogram for malignant neoplasm of breast: Secondary | ICD-10-CM

## 2023-02-16 DIAGNOSIS — F419 Anxiety disorder, unspecified: Secondary | ICD-10-CM | POA: Diagnosis not present

## 2023-02-16 DIAGNOSIS — E1165 Type 2 diabetes mellitus with hyperglycemia: Secondary | ICD-10-CM | POA: Diagnosis not present

## 2023-02-16 DIAGNOSIS — G629 Polyneuropathy, unspecified: Secondary | ICD-10-CM | POA: Diagnosis not present

## 2023-02-16 DIAGNOSIS — Z1211 Encounter for screening for malignant neoplasm of colon: Secondary | ICD-10-CM

## 2023-02-16 LAB — GLUCOSE, POCT (MANUAL RESULT ENTRY): POC Glucose: 234 mg/dl — AB (ref 70–99)

## 2023-02-16 MED ORDER — GABAPENTIN 100 MG PO CAPS: 100.0000 mg | ORAL_CAPSULE | Freq: Two times a day (BID) | ORAL | 0 refills | Status: DC

## 2023-02-16 MED ORDER — ROSUVASTATIN CALCIUM 10 MG PO TABS
10.0000 mg | ORAL_TABLET | Freq: Every day | ORAL | 3 refills | Status: DC
Start: 2023-02-16 — End: 2024-02-20

## 2023-02-16 MED ORDER — OZEMPIC (0.25 OR 0.5 MG/DOSE) 2 MG/3ML ~~LOC~~ SOPN
0.2500 mg | PEN_INJECTOR | SUBCUTANEOUS | 0 refills | Status: DC
Start: 2023-02-16 — End: 2024-02-20

## 2023-02-16 MED ORDER — ESCITALOPRAM OXALATE 10 MG PO TABS
10.0000 mg | ORAL_TABLET | Freq: Every day | ORAL | 0 refills | Status: DC
Start: 2023-02-16 — End: 2024-02-20

## 2023-02-16 NOTE — Telephone Encounter (Signed)
Patient called in to let pcp know her insurance is requiring a prior authorization for Semaglutide,0.25 or 0.5MG /DOS, (OZEMPIC, 0.25 OR 0.5 MG/DOSE,) 2 MG/3ML SOPN . Please advise.

## 2023-02-16 NOTE — Patient Instructions (Addendum)
It was great seeing you today!  Plan discussed at today's visit: -For ankle, be sure to rest, elevated it and use ice for swelling. -Start Ozempic 0.25 mg weekly injection for diabetes -Restart cholesterol medication, Crestor 10 mg -Lexapro increased to 10 mg  -Please call to schedule mammogram -Cologuard ordered to be mailed to your house  Follow up in: 1 month   Take care and let us know if you have any questions or concerns prior to your next visit.  Dr. Caralee Ates

## 2023-02-17 NOTE — Telephone Encounter (Signed)
Approved through [place] [employer] Approval #[id number] Today through 02/12/24

## 2023-03-23 NOTE — Progress Notes (Deleted)
Established Office Visit  Subjective:     Patient ID: Kelly [name], female    DOB: 01-25-1971, 52 y.o.   MRN: [id number]  No chief complaint on file.   HPI Patient is in today for follow up. Patient having some right ankle swelling and pain for the last 3 days.  Diabetes, Type 2: -Last A1c 4/24 10.5% -Medications: Started on Ozempic 0.25 mg at LOV, tried to prescribe Mounjaro, was not covered, also had an insurance issue with Trulicity. Gabapentin 100 mg BID for neuropathy  -Had been on insulin but patient non-compliant  -Checking BG at home: Not checking -Eye exam: Due -Foot exam: UTD 4/24 -Microalbumin: UTD 4/24 -Statin: yes -PNA vaccine: Prevnar 23 in 2020 -Denies symptoms of hypoglycemia, polyuria, polydipsia.  Hypertension: -Medications: Lisinopril 40 mg and HCTZ 12.5 mg -Patient is compliant with above medications and reports no side effects. -Denies any SOB, CP, vision changes, LE edema or symptoms of hypotension  HLD/Hx of TIA: -Medications: Restarted Crestor 10 mg at LOV -Last lipid panel: Lipid Panel     Component Value Date/Time   CHOL 202 (H) 11/18/2021 1425   TRIG 124 11/18/2021 1425   HDL 53 11/18/2021 1425   CHOLHDL 3.8 11/18/2021 1425   VLDL 37 06/15/2019 0614   LDLCALC 126 (H) 11/18/2021 1425    MDD: -Mood status: worse -Current treatment: Hydroxyzine as needed, Lexapro 10 mg (increased at LOV) -Satisfied with current treatment?: no Previous psychiatric medications: none Depressed mood: yes Anxious mood: yes     02/16/2023    3:10 PM 01/12/2023    2:41 PM 09/02/2022    3:33 PM 05/02/2022    7:57 AM 11/18/2021    1:40 PM  Depression screen PHQ 2/9  Decreased Interest 2 0 0 0 0  Down, Depressed, Hopeless 2 0 0 1 0  PHQ - 2 Score 4 0 0 1 0  Altered sleeping 2 0 0  0  Tired, decreased energy 1 0 0  0  Change in appetite 0 0 0  0  Feeling bad or failure about yourself  0 0 0  0  Trouble concentrating 0 0 0  0  Moving slowly or  fidgety/restless 0 0 0  0  Suicidal thoughts 0 0 0  0  PHQ-9 Score 7 0 0  0  Difficult doing work/chores Somewhat difficult Not difficult at all Not difficult at all  Not difficult at all      Review of Systems  Constitutional:  Negative for chills and fever.  Eyes:  Negative for blurred vision.  Respiratory:  Negative for shortness of breath.   Cardiovascular:  Negative for chest pain.        Objective:    There were no vitals taken for this visit. BP Readings from Last 3 Encounters:  02/16/23 (!) 148/82  01/12/23 136/80  11/11/22 (!) 166/103   Wt Readings from Last 3 Encounters:  02/16/23 181 lb (82.1 kg)  01/12/23 181 lb 12.8 oz (82.5 kg)  11/11/22 180 lb 5.4 oz (81.8 kg)      Physical Exam Constitutional:      Appearance: Normal appearance.  HENT:     Head: Normocephalic and atraumatic.  Eyes:     Conjunctiva/sclera: Conjunctivae normal.  Cardiovascular:     Rate and Rhythm: Normal rate and regular rhythm.  Pulmonary:     Effort: Pulmonary effort is normal.     Breath sounds: Normal breath sounds.  Musculoskeletal:  General: Swelling and tenderness present.     Left lower leg: No edema.     Comments: Right ATF ligament pain with swelling  Skin:    General: Skin is warm and dry.  Neurological:     General: No focal deficit present.     Mental Status: Kelly [name] is alert. Mental status is at baseline.  Psychiatric:        Mood and Affect: Mood normal.        Behavior: Behavior normal.     No results found for any visits on 03/24/23.      Assessment & Plan:   1. Type 2 diabetes mellitus with hyperglycemia, without long-term current use of insulin (HCC)/Neuropathy: Unable to get Trulicity, will switch to Ozempic 0.25 mg weekly. Continue Gabapentin 100 mg BID, refilled. POC glucose today 234.  - Semaglutide,0.25 or 0.5MG /DOS, (OZEMPIC, 0.25 OR 0.5 MG/DOSE,) 2 MG/3ML SOPN; Inject 0.25 mg into the skin once a week.  Dispense: 3 mL; Refill: 0 -  gabapentin (NEURONTIN) 100 MG capsule; Take 1 capsule (100 mg total) by mouth 2 (two) times daily.  Dispense: 60 capsule; Refill: 0 - POCT Glucose (CBG)  2. Mixed hyperlipidemia: Restart Crestor 10 mg.  - rosuvastatin (CRESTOR) 10 MG tablet; Take 1 tablet (10 mg total) by mouth at bedtime.  Dispense: 90 tablet; Refill: 3  3. Anxiety: Increase Lexapro to 10 mg.   - escitalopram (LEXAPRO) 10 MG tablet; Take 1 tablet (10 mg total) by mouth daily.  Dispense: 90 tablet; Refill: 0  4. Encounter for screening mammogram for malignant neoplasm of breast: Mammogram ordered.   - MM 3D SCREENING MAMMOGRAM BILATERAL BREAST; Future  5. Colon cancer screening: Colon cancer screening ordered.   - Cologuard  6. Sprain of anterior talofibular ligament of right ankle, initial encounter: Recommend rest, ice, elevation. Work note given.   No follow-ups on file.  Margarita Mail, DO

## 2023-03-24 ENCOUNTER — Ambulatory Visit: Payer: Medicaid Other | Admitting: Internal Medicine

## 2023-04-10 ENCOUNTER — Emergency Department: Payer: MEDICAID

## 2023-04-10 ENCOUNTER — Emergency Department
Admission: EM | Admit: 2023-04-10 | Discharge: 2023-04-10 | Disposition: A | Discharge status: Home / Self Care | Payer: MEDICAID | Attending: Emergency Medicine | Admitting: Emergency Medicine

## 2023-04-10 ENCOUNTER — Other Ambulatory Visit: Payer: Self-pay

## 2023-04-10 DIAGNOSIS — I10 Essential (primary) hypertension: Secondary | ICD-10-CM | POA: Insufficient documentation

## 2023-04-10 DIAGNOSIS — E119 Type 2 diabetes mellitus without complications: Secondary | ICD-10-CM | POA: Insufficient documentation

## 2023-04-10 DIAGNOSIS — R2243 Localized swelling, mass and lump, lower limb, bilateral: Secondary | ICD-10-CM | POA: Diagnosis not present

## 2023-04-10 DIAGNOSIS — M7989 Other specified soft tissue disorders: Secondary | ICD-10-CM

## 2023-04-10 LAB — BASIC METABOLIC PANEL
Anion gap: 8 (ref 5–15)
BUN: 19 mg/dL (ref 6–20)
CO2: 25 mmol/L (ref 22–32)
Calcium: 9.3 mg/dL (ref 8.9–10.3)
Chloride: 102 mmol/L (ref 98–111)
Creatinine, Ser: 0.85 mg/dL (ref 0.44–1.00)
GFR, Estimated: >60 mL/min (ref >60)
Glucose, Bld: 276 mg/dL — ABNORMAL HIGH (ref 70–99)
Potassium: 3.8 mmol/L (ref 3.5–5.1)
Sodium: 135 mmol/L (ref 135–145)

## 2023-04-10 LAB — CBC
HCT: 35.5 % — ABNORMAL LOW (ref 36.0–46.0)
Hemoglobin: 12 g/dL (ref 12.0–15.0)
MCH: 27.9 pg (ref 26.0–34.0)
MCHC: 33.8 g/dL (ref 30.0–36.0)
MCV: 82.6 fL (ref 80.0–100.0)
Platelets: 227 10*3/uL (ref 150–400)
RBC: 4.3 MIL/uL (ref 3.87–5.11)
RDW: 12.6 % (ref 11.5–15.5)
WBC: 5.1 10*3/uL (ref 4.0–10.5)
nRBC: 0 % (ref 0.0–0.2)

## 2023-04-10 NOTE — ED Notes (Signed)
See triage note  Presents with swelling to both ankles   States she noticed this about 2 weeks ago  Denies any injury  Good pulses

## 2023-04-10 NOTE — ED Provider Notes (Signed)
[facility] Provider Note    Event Date/Time   First MD Initiated Contact with Patient 04/10/23 [phone]     (approximate)   History   Leg Swelling   HPI  Kelly [name] is a 52 y.o. female with a past medical history of type 2 diabetes, hypertension, anxiety who presents today for evaluation of bilateral ankle swelling.  She feels that her right ankle is more swollen than her left ankle.  Patient reports that this has been ongoing for approximately 1 week.  She denies any injury.  She denies history of PE or DVT.  She has not had any shortness of breath.  No dyspnea on exertion or orthopnea.  Patient Active Problem List   Diagnosis Date Noted   Chronic active hepatitis C (HCC) 09/30/2019   Type 2 diabetes mellitus with hyperglycemia (HCC) 09/30/2019   Recurrent major depressive episodes, moderate (HCC) 06/15/2019   Transient cerebral ischemia 06/14/2019   Diabetes mellitus without complication (HCC) 07/11/2017   Hypertension 07/11/2017   Anxiety 01/19/2012          Physical Exam   Triage Vital Signs: ED Triage Vitals  Encounter Vitals Group     BP 04/10/23 0741 (!) 145/115     Systolic BP Percentile --      Diastolic BP Percentile --      Pulse Rate 04/10/23 0741 88     Resp 04/10/23 0741 18     Temp 04/10/23 0737 98.2 F (36.8 C)     Temp src --      SpO2 04/10/23 0741 100 %     Weight 04/10/23 0739 182 lb (82.6 kg)     Height 04/10/23 0739 5\' 6"  (1.676 m)     Head Circumference --      Peak Flow --      Pain Score 04/10/23 0739 7     Pain Loc --      Pain Education --      Exclude from Growth Chart --     Most recent vital signs: Vitals:   04/10/23 0741 04/10/23 0950  BP: (!) 145/115 (!) 140/94  Pulse: 88 80  Resp: 18 18  Temp:    SpO2: 100% 100%    Physical Exam Vitals and nursing note reviewed.  Constitutional:      General: Awake and alert. No acute distress.    Appearance: Normal appearance. The patient is normal  weight.  HENT:     Head: Normocephalic and atraumatic.     Mouth: Mucous membranes are moist.  Eyes:     General: PERRL. Normal EOMs        Right eye: No discharge.        Left eye: No discharge.     Conjunctiva/sclera: Conjunctivae normal.  Cardiovascular:     Rate and Rhythm: Normal rate and regular rhythm.     Pulses: Normal pulses.  Pulmonary:     Effort: Pulmonary effort is normal. No respiratory distress.     Breath sounds: Normal breath sounds.  No rales, no JVD Abdominal:     Abdomen is soft. There is no abdominal tenderness. No rebound or guarding. No distention. Musculoskeletal:        General: No swelling. Normal range of motion.     Cervical back: Normal range of motion and neck supple.  Bilateral lower extremity edema, right greater than left, nonpitting.  Normal distal pulses.  Normal capillary refill.  No erythema or skin color changes.  No calf tenderness.  Full and normal range of motion of hip, ankle, knee. Skin:    General: Skin is warm and dry.     Capillary Refill: Capillary refill takes less than 2 seconds.     Findings: No rash.  Neurological:     Mental Status: The patient is awake and alert.      ED Results / Procedures / Treatments   Labs (all labs ordered are listed, but only abnormal results are displayed) Labs Reviewed  CBC - Abnormal; Notable for the following components:      Result Value   HCT 35.5 (*)    All other components within normal limits  BASIC METABOLIC PANEL - Abnormal; Notable for the following components:   Glucose, Bld 276 (*)    All other components within normal limits     EKG     RADIOLOGY I independently reviewed and interpreted imaging and agree with radiologists findings.     PROCEDURES:  Critical Care performed:   Procedures   MEDICATIONS ORDERED IN ED: Medications - No data to display   IMPRESSION / MDM / ASSESSMENT AND PLAN / ED COURSE  I reviewed the triage vital signs and the nursing  notes.   Differential diagnosis includes, but is not limited to, medication side effect, proteinuria, DVT, dependent edema.  Patient is awake and alert, hemodynamically stable and afebrile.  Labs obtained in triage are overall reassuring.  Duplex ultrasound is negative for DVT.  I reviewed the patient's chart.  She was recently seen by the nephrology clinic on 03/20/2023 due to proteinuria.  Her proteinuria was thought to most likely be secondary to her diabetes as her A1c is uncontrolled.  She is also on amlodipine which may be contributing to her leg swelling.  I feel these are the most likely etiologies of her leg swelling especially in setting of her negative DVT.  There are no infectious symptoms.  No evidence of cellulitis.  Recommended elevation, compression stockings, and better glucose control to help with her proteinuria as recommended by the nephrologist.  I also advised that she follow-up with her PCP to discuss amlodipine dosage as this may also be contributing.  We discussed return precautions and outpatient follow-up.  Patient or stands and agrees with plan.  She was discharged in stable condition.   Patient's presentation is most consistent with acute complicated illness / injury requiring diagnostic workup.     FINAL CLINICAL IMPRESSION(S) / ED DIAGNOSES   Final diagnoses:  Leg swelling     Rx / DC Orders   ED Discharge Orders     None        Note:  This document was prepared using Dragon voice recognition software and may include unintentional dictation errors.   Keturah Shavers 04/10/23 1028    Pilar Jarvis, MD 04/10/23 [phone]

## 2023-04-10 NOTE — Discharge Instructions (Signed)
I suspect that your leg swelling is due to the protein in your urine or a side effect of the amlodipine that you are on.  Please discuss this with your outpatient provider to see if there is an alternative medicine if this continues to bother you.  Your blood work and ultrasound did not reveal acute findings or DVT.  It was a pleasure caring for you today.

## 2023-04-10 NOTE — ED Triage Notes (Signed)
Pt to ED for bilateral ankle swelling x1 week. Denies injuries. Reports right ankle more swollen than left. Swelling noted. Denies shob

## 2023-07-25 ENCOUNTER — Other Ambulatory Visit: Payer: Self-pay

## 2023-07-25 ENCOUNTER — Emergency Department
Admission: EM | Admit: 2023-07-25 | Discharge: 2023-07-25 | Disposition: A | Discharge status: Home / Self Care | Payer: MEDICAID | Attending: Emergency Medicine | Admitting: Emergency Medicine

## 2023-07-25 ENCOUNTER — Emergency Department: Payer: MEDICAID

## 2023-07-25 ENCOUNTER — Encounter: Payer: Self-pay | Admitting: Emergency Medicine

## 2023-07-25 DIAGNOSIS — I10 Essential (primary) hypertension: Secondary | ICD-10-CM | POA: Insufficient documentation

## 2023-07-25 DIAGNOSIS — E119 Type 2 diabetes mellitus without complications: Secondary | ICD-10-CM | POA: Diagnosis not present

## 2023-07-25 DIAGNOSIS — N3000 Acute cystitis without hematuria: Secondary | ICD-10-CM | POA: Insufficient documentation

## 2023-07-25 DIAGNOSIS — Z79899 Other long term (current) drug therapy: Secondary | ICD-10-CM | POA: Insufficient documentation

## 2023-07-25 DIAGNOSIS — R109 Unspecified abdominal pain: Secondary | ICD-10-CM | POA: Diagnosis present

## 2023-07-25 LAB — LIPASE, BLOOD: Lipase: 37 U/L (ref 11–51)

## 2023-07-25 LAB — URINALYSIS, ROUTINE W REFLEX MICROSCOPIC
Bilirubin Urine: NEGATIVE
Glucose, UA: 50 mg/dL — AB
Ketones, ur: NEGATIVE mg/dL
Nitrite: NEGATIVE
Protein, ur: 100 mg/dL — AB
Specific Gravity, Urine: 1.006 (ref 1.005–1.030)
Squamous Epithelial / HPF: >50 /[HPF] (ref 0–5)
WBC, UA: >50 WBC/hpf (ref 0–5)
pH: 6 (ref 5.0–8.0)

## 2023-07-25 LAB — HEPATIC FUNCTION PANEL
ALT: 13 U/L (ref 0–44)
AST: 12 U/L — ABNORMAL LOW (ref 15–41)
Albumin: 3.6 g/dL (ref 3.5–5.0)
Alkaline Phosphatase: 52 U/L (ref 38–126)
Bilirubin, Direct: <0.1 mg/dL (ref 0.0–0.2)
Total Bilirubin: 0.6 mg/dL (ref <1.2)
Total Protein: 7.9 g/dL (ref 6.5–8.1)

## 2023-07-25 LAB — BASIC METABOLIC PANEL
Anion gap: 8 (ref 5–15)
BUN: 16 mg/dL (ref 6–20)
CO2: 24 mmol/L (ref 22–32)
Calcium: 9.1 mg/dL (ref 8.9–10.3)
Chloride: 103 mmol/L (ref 98–111)
Creatinine, Ser: 0.85 mg/dL (ref 0.44–1.00)
GFR, Estimated: >60 mL/min (ref >60)
Glucose, Bld: 252 mg/dL — ABNORMAL HIGH (ref 70–99)
Potassium: 3.6 mmol/L (ref 3.5–5.1)
Sodium: 135 mmol/L (ref 135–145)

## 2023-07-25 LAB — CBC
HCT: 36.6 % (ref 36.0–46.0)
Hemoglobin: 12.2 g/dL (ref 12.0–15.0)
MCH: 27 pg (ref 26.0–34.0)
MCHC: 33.3 g/dL (ref 30.0–36.0)
MCV: 81 fL (ref 80.0–100.0)
Platelets: 247 10*3/uL (ref 150–400)
RBC: 4.52 MIL/uL (ref 3.87–5.11)
RDW: 12.7 % (ref 11.5–15.5)
WBC: 7.3 10*3/uL (ref 4.0–10.5)
nRBC: 0 % (ref 0.0–0.2)

## 2023-07-25 MED ORDER — MORPHINE SULFATE (PF) 4 MG/ML IV SOLN: 4.0000 mg | Freq: Once | INTRAVENOUS | Status: DC

## 2023-07-25 MED ORDER — SODIUM CHLORIDE 0.9 % IV SOLN
1.0000 g | Freq: Once | INTRAVENOUS | Status: AC
  Administered 2023-07-25: 1 g via INTRAVENOUS
  Filled 2023-07-25: qty 10

## 2023-07-25 MED ORDER — HYDROMORPHONE HCL 1 MG/ML IJ SOLN
0.5000 mg | Freq: Once | INTRAMUSCULAR | Status: AC
  Administered 2023-07-25: 0.5 mg via INTRAVENOUS
  Filled 2023-07-25: qty 0.5

## 2023-07-25 MED ORDER — SODIUM CHLORIDE 0.9 % IV BOLUS
1000.0000 mL | Freq: Once | INTRAVENOUS | Status: AC
  Administered 2023-07-25: 1000 mL via INTRAVENOUS

## 2023-07-25 MED ORDER — ONDANSETRON HCL 4 MG/2ML IJ SOLN
4.0000 mg | Freq: Once | INTRAMUSCULAR | Status: AC
  Administered 2023-07-25: 4 mg via INTRAVENOUS
  Filled 2023-07-25: qty 2

## 2023-07-25 MED ORDER — OXYCODONE-ACETAMINOPHEN 5-325 MG PO TABS: 1.0000 | ORAL_TABLET | ORAL | 0 refills | Status: DC | PRN

## 2023-07-25 MED ORDER — CEPHALEXIN 500 MG PO CAPS: 500.0000 mg | ORAL_CAPSULE | Freq: Three times a day (TID) | ORAL | 0 refills | Status: DC

## 2023-07-25 MED ORDER — ONDANSETRON 4 MG PO TBDP: 4.0000 mg | ORAL_TABLET | Freq: Three times a day (TID) | ORAL | 0 refills | Status: AC | PRN

## 2023-07-25 NOTE — ED Provider Notes (Signed)
[facility] Provider Note    Event Date/Time   First MD Initiated Contact with Patient 07/25/23 0221     (approximate)   History   Groin Pain   HPI  Kelly [name] is a 52 y.o. female brought to the ED via EMS from work with a chief complaint of right-sided groin pain.  Patient awoke with nontraumatic right groin pain yesterday morning, worsened overnight.  History of kidney stones.  Denies fall/injury/trauma.  Denies fever/chills, chest pain, shortness of breath, nausea, vomiting or hematuria.     Past Medical History   Past Medical History:  Diagnosis Date   Anxiety    Diabetes mellitus without complication (HCC)    Hyperlipidemia    Hypertension      Active Problem List   Patient Active Problem List   Diagnosis Date Noted   Chronic active hepatitis C (HCC) 09/30/2019   Type 2 diabetes mellitus with hyperglycemia (HCC) 09/30/2019   Recurrent major depressive episodes, moderate (HCC) 06/15/2019   Transient cerebral ischemia 06/14/2019   Diabetes mellitus without complication (HCC) 07/11/2017   Hypertension 07/11/2017   Anxiety 01/19/2012     Past Surgical History   Past Surgical History:  Procedure Laterality Date   ABDOMINAL HYSTERECTOMY     GALLBLADDER SURGERY       Home Medications   Prior to Admission medications   Medication Sig Start Date End Date Taking? Authorizing Provider  cephALEXin (KEFLEX) 500 MG capsule Take 1 capsule (500 mg total) by mouth 3 (three) times daily. 07/25/23  Yes Irean Hong, MD  ondansetron (ZOFRAN-ODT) 4 MG disintegrating tablet Take 1 tablet (4 mg total) by mouth every 8 (eight) hours as needed for nausea or vomiting. 07/25/23  Yes Irean Hong, MD  oxyCODONE-acetaminophen (PERCOCET/ROXICET) 5-325 MG tablet Take 1 tablet by mouth every 4 (four) hours as needed for severe pain (pain score 7-10). 07/25/23  Yes Irean Hong, MD  Accu-Chek Softclix Lancets lancets Check BG TID DX:E11.65, LON:99 04/09/20    Danelle Berry, PA-C  albuterol (VENTOLIN HFA) 108 (90 Base) MCG/ACT inhaler Inhale 2 puffs into the lungs every 6 (six) hours as needed for wheezing or shortness of breath. 12/09/21   Phineas Semen, MD  Blood Glucose Monitoring Suppl (ACCU-CHEK GUIDE ME) w/Device KIT Check BG TID LON:99, DX:E11.65 04/09/20   Danelle Berry, PA-C  escitalopram (LEXAPRO) 10 MG tablet Take 1 tablet (10 mg total) by mouth daily. 02/16/23   Margarita Mail, DO  gabapentin (NEURONTIN) 100 MG capsule Take 1 capsule (100 mg total) by mouth 2 (two) times daily. 02/16/23   Margarita Mail, DO  glucose blood (ACCU-CHEK GUIDE) test strip Dx:E11.65, LON:99 check BG tid 04/09/20   Danelle Berry, PA-C  hydrochlorothiazide (HYDRODIURIL) 12.5 MG tablet Take 1 tablet (12.5 mg total) by mouth daily. 01/12/23   Margarita Mail, DO  hydrOXYzine (ATARAX) 10 MG tablet Take 1 tablet (10 mg total) by mouth every 8 (eight) hours as needed for anxiety. 01/12/23   Margarita Mail, DO  lisinopril (ZESTRIL) 40 MG tablet Take 1 tablet (40 mg total) by mouth daily. 01/12/23   Margarita Mail, DO  rosuvastatin (CRESTOR) 10 MG tablet Take 1 tablet (10 mg total) by mouth at bedtime. 02/16/23   Margarita Mail, DO  Semaglutide,0.25 or 0.5MG /DOS, (OZEMPIC, 0.25 OR 0.5 MG/DOSE,) 2 MG/3ML SOPN Inject 0.25 mg into the skin once a week. 02/16/23   Margarita Mail, DO     Allergies  Patient has no known allergies.  Family History   Family History  Problem Relation Age of Onset   Breast cancer Mother      Physical Exam  Triage Vital Signs: ED Triage Vitals  Encounter Vitals Group     BP 07/25/23 0155 (!) 191/97     Systolic BP Percentile --      Diastolic BP Percentile --      Pulse Rate 07/25/23 0155 83     Resp 07/25/23 0155 18     Temp 07/25/23 0155 98 F (36.7 C)     Temp Source 07/25/23 0155 Oral     SpO2 07/25/23 0147 98 %     Weight 07/25/23 0151 181 lb (82.1 kg)     Height 07/25/23 0151 5\' 6"  (1.676 m)     Head  Circumference --      Peak Flow --      Pain Score 07/25/23 0150 10     Pain Loc --      Pain Education --      Exclude from Growth Chart --     Updated Vital Signs: BP (!) 140/97   Pulse 75   Temp 98 F (36.7 C) (Oral)   Resp 18   Ht 5\' 6"  (1.676 m)   Wt 82.1 kg   SpO2 98%   BMI 29.21 kg/m    General: Awake, mild distress.  CV:  RRR.  Good peripheral perfusion.  Resp:  Normal effort.  CTAB. Abd:  Mild right flank and abdominal tenderness to palpation without rebound or guarding.  No distention.  Other:  No truncal vesicles.  No palpable swelling to right inguinal region.   ED Results / Procedures / Treatments  Labs (all labs ordered are listed, but only abnormal results are displayed) Labs Reviewed  URINALYSIS, ROUTINE W REFLEX MICROSCOPIC - Abnormal; Notable for the following components:      Result Value   Color, Urine YELLOW (*)    APPearance CLOUDY (*)    Glucose, UA 50 (*)    Hgb urine dipstick MODERATE (*)    Protein, ur 100 (*)    Leukocytes,Ua LARGE (*)    Bacteria, UA MANY (*)    All other components within normal limits  BASIC METABOLIC PANEL - Abnormal; Notable for the following components:   Glucose, Bld 252 (*)    All other components within normal limits  HEPATIC FUNCTION PANEL - Abnormal; Notable for the following components:   AST 12 (*)    All other components within normal limits  URINE CULTURE  CBC  LIPASE, BLOOD     EKG  None   RADIOLOGY I have independently visualized and interpreted patient's imaging study as well as noted the radiology interpretation:  CT renal colic study: Mildly thick-walled bladder, fat-containing bilateral tiny inguinal hernias  Official radiology report(s): CT Renal Stone Study  Result Date: 07/25/2023 CLINICAL DATA:  Right groin pain EXAM: CT ABDOMEN AND PELVIS WITHOUT CONTRAST TECHNIQUE: Multidetector CT imaging of the abdomen and pelvis was performed following the standard protocol without IV contrast.  RADIATION DOSE REDUCTION: This exam was performed according to the departmental dose-optimization program which includes automated exposure control, adjustment of the mA and/or kV according to patient size and/or use of iterative reconstruction technique. COMPARISON:  04/20/2017 FINDINGS: Lower chest: Lung bases are clear. Hepatobiliary: Unenhanced liver is unremarkable. Status post cholecystectomy. No intrahepatic or extrahepatic ductal dilatation. Pancreas: Within normal limits. Spleen: Within normal limits. Adrenals/Urinary Tract: Adrenal glands are within normal limits. Kidneys are within normal  limits. No renal, ureteral, or bladder calculi. No hydronephrosis. Mildly thick-walled bladder. Stomach/Bowel: Stomach is within normal limits. No evidence of bowel obstruction. Normal appendix (series 2/image 53). Scattered colonic diverticulosis, without evidence of diverticulitis. Vascular/Lymphatic: No evidence of abdominal aortic aneurysm. No suspicious abdominopelvic lymphadenopathy. Reproductive: Status post hysterectomy. Left ovary is within normal limits.  No right adnexal mass. Other: No abdominopelvic ascites. Small fat containing periumbilical hernia. Tiny fat containing bilateral inguinal hernias (series 2/image 36). Musculoskeletal: Visualized osseous structures are within normal limits. IMPRESSION: Mildly thick-walled bladder, correlate for cystitis. Tiny fat containing bilateral inguinal hernias. Additional ancillary findings as above. Electronically Signed   By: Charline Bills M.D.   On: 07/25/2023 02:34     PROCEDURES:  Critical Care performed: No  Procedures   MEDICATIONS ORDERED IN ED: Medications  cefTRIAXone (ROCEPHIN) 1 g in sodium chloride 0.9 % 100 mL IVPB (1 g Intravenous New Bag/Given 07/25/23 0333)  ondansetron (ZOFRAN) injection 4 mg (4 mg Intravenous Given 07/25/23 0246)  HYDROmorphone (DILAUDID) injection 0.5 mg (0.5 mg Intravenous Given 07/25/23 0246)  sodium chloride 0.9 %  bolus 1,000 mL (1,000 mLs Intravenous New Bag/Given 07/25/23 0246)     IMPRESSION / MDM / ASSESSMENT AND PLAN / ED COURSE  I reviewed the triage vital signs and the nursing notes.                             52 year old female presenting with nontraumatic right groin pain. Differential diagnosis includes, but is not limited to, ovarian cyst, ovarian torsion, acute appendicitis, diverticulitis, urinary tract infection/pyelonephritis, endometriosis, bowel obstruction, colitis, renal colic, gastroenteritis, hernia, etc. I personally reviewed patient's records and note her nephrology office visit from 03/20/2023 for follow-up chronic kidney disease, hypertension and diabetes.  Patient's presentation is most consistent with acute illness / injury with system symptoms.  Laboratory results unremarkable, normal WBC, normal renal function.  Will add LFTs/lipase.  Awaiting UA.  CT unremarkable.  Administer IV fluid hydration, Dilaudid for pain, Zofran for nausea.  Will reassess.  Clinical Course as of 07/25/23 0353  Tue Jul 25, 2023  0350 Patient feeling better.  Updated her on laboratory and imaging results.  Will discharge home with prescription for Keflex, pain and nausea medicine as needed.  She will follow-up closely with her PCP.  Strict return precautions given.  Patient verbalizes understanding and agrees with plan of care. [JS]    Clinical Course User Index [JS] Irean Hong, MD     FINAL CLINICAL IMPRESSION(S) / ED DIAGNOSES   Final diagnoses:  Right flank pain  Abdominal pain, unspecified abdominal location  Acute cystitis without hematuria     Rx / DC Orders   ED Discharge Orders          Ordered    cephALEXin (KEFLEX) 500 MG capsule  3 times daily        07/25/23 0352    oxyCODONE-acetaminophen (PERCOCET/ROXICET) 5-325 MG tablet  Every 4 hours PRN        07/25/23 0352    ondansetron (ZOFRAN-ODT) 4 MG disintegrating tablet  Every 8 hours PRN        07/25/23 0352              Note:  This document was prepared using Dragon voice recognition software and may include unintentional dictation errors.   Irean Hong, MD 07/25/23 [phone]

## 2023-07-25 NOTE — ED Triage Notes (Addendum)
Patient brought in tonight from work by [employer] due to worsening right sided groin pain. Patient does have hx of kidney stones but states it does not normally feel like this. Denies dysuria/flank pain at this time.

## 2023-07-25 NOTE — ED Notes (Signed)
Lab contacted about add ons

## 2023-07-25 NOTE — Discharge Instructions (Signed)
1.  Take and finish antibiotic as prescribed (Keflex 500 mg 3 times daily x 7 days). 2.  You may take Ibuprofen as needed for pain; Percocet as needed for more severe pain.  You may take Zofran as needed for nausea. 3.  Return to the ER for worsening symptoms, persistent vomiting, difficulty breathing or other concerns.

## 2023-07-27 LAB — URINE CULTURE: Culture: >=100000 — AB

## 2023-08-11 ENCOUNTER — Encounter: Payer: Self-pay | Admitting: Nurse Practitioner

## 2023-08-11 ENCOUNTER — Other Ambulatory Visit: Payer: Self-pay

## 2023-08-11 ENCOUNTER — Ambulatory Visit: Payer: MEDICAID | Admitting: Nurse Practitioner

## 2023-08-11 ENCOUNTER — Other Ambulatory Visit (HOSPITAL_COMMUNITY)
Admission: RE | Admit: 2023-08-11 | Discharge: 2023-08-11 | Disposition: A | Discharge status: Home / Self Care | Payer: MEDICAID | Source: Ambulatory Visit | Attending: Nurse Practitioner | Admitting: Nurse Practitioner

## 2023-08-11 VITALS — BP 124/72 | HR 100 | Temp 97.7°F | Resp 18 | Ht 64.0 in | Wt 182.1 lb

## 2023-08-11 DIAGNOSIS — Z202 Contact with and (suspected) exposure to infections with a predominantly sexual mode of transmission: Secondary | ICD-10-CM | POA: Insufficient documentation

## 2023-08-11 NOTE — Progress Notes (Signed)
BP 124/72   Pulse 100   Temp 97.7 F (36.5 C) (Oral)   Resp 18   Ht 5\' 4"  (1.626 m)   Wt 182 lb 1.6 oz (82.6 kg)   SpO2 98%   BMI 31.26 kg/m    Subjective:    Patient ID: Kelly [name], female    DOB: 08-Feb-1971, 52 y.o.   MRN: [id number]  HPI: Kelly [name] is a 52 y.o. female  Chief Complaint  Patient presents with   Exposure to STD   Discussed the use of AI scribe software for clinical note transcription with the patient, who gave verbal consent to proceed.  History of Present Illness   The patient presents with concern for sexually transmitted infection after a recent intimate encounter with a friend who was diagnosed with gonorrhea. The encounter occurred approximately two weeks ago, and the friend disclosed her diagnosis last week. The patient has noticed sores in her mouth for about a week, which are not painful. She also reports a slight vaginal discharge with an odor, but no other significant symptoms. The patient has a history of hysterectomy, so there is no concern for pregnancy.   Relevant past medical, surgical, family and social history reviewed and updated as indicated. Interim medical history since our last visit reviewed. Allergies and medications reviewed and updated.  Review of Systems  Ten systems reviewed and is negative except as mentioned in HPI       Objective:    BP 124/72   Pulse 100   Temp 97.7 F (36.5 C) (Oral)   Resp 18   Ht 5\' 4"  (1.626 m)   Wt 182 lb 1.6 oz (82.6 kg)   SpO2 98%   BMI 31.26 kg/m   Wt Readings from Last 3 Encounters:  08/11/23 182 lb 1.6 oz (82.6 kg)  07/25/23 181 lb (82.1 kg)  04/10/23 182 lb (82.6 kg)    Physical Exam  Constitutional: Patient appears well-developed and well-nourished. Obese  No distress.  HEENT: head atraumatic, normocephalic, pupils equal and reactive to light, neck supple, throat within normal limits, two sores the size of a pencil eraser in the mouth Cardiovascular: Normal rate,  regular rhythm and normal heart sounds.  No murmur heard. No BLE edema. Pulmonary/Chest: Effort normal and breath sounds normal. No respiratory distress. Abdominal: Soft.  There is no tenderness. Psychiatric: Patient has a normal mood and affect. behavior is normal. Judgment and thought content normal.   Results for orders placed or performed during the hospital encounter of 07/25/23  Urine Culture   Specimen: Urine, Random  Result Value Ref Range   Specimen Description      URINE, RANDOM Performed at [facility], [address] [place], [place] [address]    Special Requests      NONE Performed at [facility], [address] [place], [place] [address]    Culture >=100,000 COLONIES/mL ESCHERICHIA COLI (A)    Report Status 07/27/2023 FINAL    Organism ID, Bacteria ESCHERICHIA COLI (A)       Susceptibility   Escherichia coli - MIC*    AMPICILLIN 4 SENSITIVE Sensitive     CEFAZOLIN <=4 SENSITIVE Sensitive     CEFEPIME <=0.12 SENSITIVE Sensitive     CEFTRIAXONE <=0.25 SENSITIVE Sensitive     CIPROFLOXACIN >=4 RESISTANT Resistant     GENTAMICIN <=1 SENSITIVE Sensitive     IMIPENEM <=0.25 SENSITIVE Sensitive     NITROFURANTOIN <=16 SENSITIVE Sensitive     TRIMETH/SULFA <=  20 SENSITIVE Sensitive     AMPICILLIN/SULBACTAM <=2 SENSITIVE Sensitive     PIP/TAZO <=4 SENSITIVE Sensitive ug/mL    * >=100,000 COLONIES/mL ESCHERICHIA COLI  Urinalysis, Routine w reflex microscopic -Urine, Clean Catch  Result Value Ref Range   Color, Urine YELLOW (A) YELLOW   APPearance CLOUDY (A) CLEAR   Specific Gravity, Urine 1.006 1.005 - 1.030   pH 6.0 5.0 - 8.0   Glucose, UA 50 (A) NEGATIVE mg/dL   Hgb urine dipstick MODERATE (A) NEGATIVE   Bilirubin Urine NEGATIVE NEGATIVE   Ketones, ur NEGATIVE NEGATIVE mg/dL   Protein, ur 557 (A) NEGATIVE mg/dL   Nitrite NEGATIVE NEGATIVE   Leukocytes,Ua LARGE (A) NEGATIVE   RBC / HPF 6-10 0 - 5 RBC/hpf   WBC, UA >50 0 - 5 WBC/hpf    Bacteria, UA MANY (A) NONE SEEN   Squamous Epithelial / HPF >50 0 - 5 /HPF   WBC Clumps PRESENT   Basic metabolic panel  Result Value Ref Range   Sodium 135 135 - 145 mmol/L   Potassium 3.6 3.5 - 5.1 mmol/L   Chloride 103 98 - 111 mmol/L   CO2 24 22 - 32 mmol/L   Glucose, Bld 252 (H) 70 - 99 mg/dL   BUN 16 6 - 20 mg/dL   Creatinine, Ser 3.22 0.44 - 1.00 mg/dL   Calcium 9.1 8.9 - 02.5 mg/dL   GFR, Estimated >42 >70 mL/min   Anion gap 8 5 - 15  CBC  Result Value Ref Range   WBC 7.3 4.0 - 10.5 K/uL   RBC 4.52 3.87 - 5.11 MIL/uL   Hemoglobin 12.2 12.0 - 15.0 g/dL   HCT 62.3 76.2 - 83.1 %   MCV 81.0 80.0 - 100.0 fL   MCH 27.0 26.0 - 34.0 pg   MCHC 33.3 30.0 - 36.0 g/dL   RDW 51.7 61.6 - 07.3 %   Platelets 247 150 - 400 K/uL   nRBC 0.0 0.0 - 0.2 %  Hepatic function panel  Result Value Ref Range   Total Protein 7.9 6.5 - 8.1 g/dL   Albumin 3.6 3.5 - 5.0 g/dL   AST 12 (L) 15 - 41 U/L   ALT 13 0 - 44 U/L   Alkaline Phosphatase 52 38 - 126 U/L   Total Bilirubin 0.6 <1.2 mg/dL   Bilirubin, Direct <7.1 0.0 - 0.2 mg/dL   Indirect Bilirubin NOT CALCULATED 0.3 - 0.9 mg/dL  Lipase, blood  Result Value Ref Range   Lipase 37 11 - 51 U/L      Assessment & Plan:   Problem List Items Addressed This Visit   None Visit Diagnoses     STD exposure    -  Primary   Relevant Orders   Cervicovaginal ancillary only   HIV antibody (with reflex)   RPR   Hepatitis C antibody   Herpes simplex virus culture       Assessment and Plan    Possible Sexually Transmitted Infection Recent sexual contact with a partner diagnosed with gonorrhea. Oral sores present for one week, no pain. Mild vaginal discharge with odor. -Perform oral swab for herpes. -Collect blood samples for syphilis, hepatitis C, and HIV testing. -Perform vaginal swab for gonorrhea, chlamydia, trichomonas, and yeast testing.        Follow up plan: Return if symptoms worsen or fail to improve.

## 2023-08-14 ENCOUNTER — Other Ambulatory Visit: Payer: Self-pay | Admitting: Nurse Practitioner

## 2023-08-14 DIAGNOSIS — B379 Candidiasis, unspecified: Secondary | ICD-10-CM

## 2023-08-14 DIAGNOSIS — B9689 Other specified bacterial agents as the cause of diseases classified elsewhere: Secondary | ICD-10-CM

## 2023-08-14 LAB — CERVICOVAGINAL ANCILLARY ONLY
Bacterial Vaginitis (gardnerella): POSITIVE — AB
Candida Glabrata: NEGATIVE
Candida Vaginitis: POSITIVE — AB
Chlamydia: NEGATIVE
Comment: NEGATIVE
Comment: NEGATIVE
Comment: NEGATIVE
Comment: NEGATIVE
Comment: NEGATIVE
Comment: NORMAL
Neisseria Gonorrhea: NEGATIVE
Trichomonas: NEGATIVE

## 2023-08-14 MED ORDER — METRONIDAZOLE 500 MG PO TABS
500.0000 mg | ORAL_TABLET | Freq: Three times a day (TID) | ORAL | 0 refills | Status: AC
Start: 2023-08-14 — End: 2023-08-21

## 2023-08-14 MED ORDER — FLUCONAZOLE 150 MG PO TABS: 150.0000 mg | ORAL_TABLET | ORAL | 0 refills | Status: DC | PRN

## 2023-08-15 LAB — HERPES SIMPLEX VIRUS CULTURE
MICRO NUMBER:: 15770408
SPECIMEN QUALITY:: ADEQUATE

## 2023-08-15 LAB — HCV RNA,QUANTITATIVE REAL TIME PCR
HCV Quantitative Log: <1.18 {Log}
HCV RNA, PCR, QN: <15 [IU]/mL

## 2023-08-15 LAB — RPR: RPR Ser Ql: NONREACTIVE

## 2023-08-15 LAB — HEPATITIS C ANTIBODY: Hepatitis C Ab: REACTIVE — AB

## 2023-08-15 LAB — HIV ANTIBODY (ROUTINE TESTING W REFLEX): HIV 1&2 Ab, 4th Generation: NONREACTIVE

## 2023-09-25 ENCOUNTER — Ambulatory Visit: Payer: MEDICAID | Admitting: Internal Medicine

## 2023-10-02 ENCOUNTER — Ambulatory Visit: Payer: MEDICAID | Admitting: Physician Assistant

## 2023-10-02 NOTE — Progress Notes (Deleted)
 Name: Kelly [name]   MRN: [id number]    DOB: November 28, 1970   Date:10/02/2023       Progress Note  Subjective  Chief Complaint  No chief complaint on file.   HPI  Patient presents for annual CPE.  Diet: *** Exercise: ***  Last Eye Exam: {HM options:31805} Last Dental Exam: {HM options:31805}  Flowsheet Row Office Visit from 08/11/20[facility] in [facility]  AUDIT-C Score 0      Depression: Phq 9 is  {Desc; negative/positive:13464}    08/11/2023    9:40 AM 02/16/2023    3:10 PM 01/12/2023    2:41 PM 09/02/2022    3:33 PM 05/02/2022    7:57 AM  Depression screen PHQ 2/9  Decreased Interest 0 2 0 0 0  Down, Depressed, Hopeless 0 2 0 0 1  PHQ - 2 Score 0 4 0 0 1  Altered sleeping  2 0 0   Tired, decreased energy  1 0 0   Change in appetite  0 0 0   Feeling bad or failure about yourself   0 0 0   Trouble concentrating  0 0 0   Moving slowly or fidgety/restless  0 0 0   Suicidal thoughts  0 0 0   PHQ-9 Score  7 0 0   Difficult doing work/chores  Somewhat difficult Not difficult at all Not difficult at all    Hypertension: BP Readings from Last 3 Encounters:  08/11/23 124/72  07/25/23 (!) 140/97  04/10/23 (!) 140/94   Obesity: Wt Readings from Last 3 Encounters:  08/11/23 182 lb 1.6 oz (82.6 kg)  07/25/23 181 lb (82.1 kg)  04/10/23 182 lb (82.6 kg)   BMI Readings from Last 3 Encounters:  08/11/23 31.26 kg/m  07/25/23 29.21 kg/m  04/10/23 29.38 kg/m     Vaccines:  RSV: {vaccine options:31805} HPV: {vaccine options:31805} Tdap: completed Shingrix: {vaccine options:31805} Pneumonia: {vaccine options:31805} Flu: {vaccine options:31805} COVID-19: {vaccine options:31805}  Hep C Screening: {HM options:31805} STD testing and prevention (HIV/chl/gon/syphilis):  Intimate partner violence: {Desc; negative/positive:13464} screen  Sexual History : Menstrual History/LMP/Abnormal Bleeding:  Discussed importance of follow up if any  post-menopausal bleeding: {Response; yes/no/na:63}  Incontinence Symptoms: {Desc; negative/positive:13464} for symptoms   Breast cancer:  - Last Mammogram: *** - BRCA gene screening:   Osteoporosis Prevention : Discussed high calcium  and vitamin D supplementation, weight bearing exercises Bone density :{Response; yes/no/na:63}   Cervical cancer screening: {pap options:31806}  Skin cancer: Discussed monitoring for atypical lesions  Colorectal cancer: ***   Lung cancer:  Low Dose CT Chest recommended if Age 8-80 years, 20 pack-year currently smoking OR have quit w/in 15years. Patient {DOES NOT does:27190::does not} qualify for screen   ECG: ***  Advanced Care Planning: A voluntary discussion about advance care planning including the explanation and discussion of advance directives.  Discussed health care proxy and Living will, and the patient was able to identify a health care proxy as ***.  Patient {DOES_DOES WNU:81435} have a living will and power of attorney of health care   Patient Active Problem List   Diagnosis Date Noted   Chronic active hepatitis C (HCC) 09/30/2019   Type 2 diabetes mellitus with hyperglycemia (HCC) 09/30/2019   Recurrent major depressive episodes, moderate (HCC) 06/15/2019   Transient cerebral ischemia 06/14/2019   Diabetes mellitus without complication (HCC) 07/11/2017   Hypertension 07/11/2017   Anxiety 01/19/2012    Past Surgical History:  Procedure Laterality Date   ABDOMINAL HYSTERECTOMY  GALLBLADDER SURGERY      Family History  Problem Relation Age of Onset   Breast cancer Mother     Social History   Socioeconomic History   Marital status: Single    Spouse name: Not on file   Number of children: 1   Years of education: Not on file   Highest education level: Not on file  Occupational History   Occupation: Public Affairs Consultant    Comment: cleaning  Tobacco Use   Smoking status: Never   Smokeless tobacco: Never  Vaping Use    Vaping status: Never Used  Substance and Sexual Activity   Alcohol use: Yes    Comment: Socially    Drug use: No   Sexual activity: Yes    Partners: Male    Birth control/protection: Surgical  Other Topics Concern   Not on file  Social History Narrative   Daughter lives with patient   Social Drivers of Health   Financial Resource Strain: Low Risk  (07/28/2020)   Overall Financial Resource Strain (CARDIA)    Difficulty of Paying Living Expenses: Not hard at all  Food Insecurity: No Food Insecurity (11/18/2021)   Hunger Vital Sign    Worried About Running Out of Food in the Last Year: Never true    Ran Out of Food in the Last Year: Never true  Transportation Needs: No Transportation Needs (11/18/2021)   PRAPARE - Administrator, Civil Service (Medical): No    Lack of Transportation (Non-Medical): No  Physical Activity: Inactive (11/18/2021)   Exercise Vital Sign    Days of Exercise per Week: 0 days    Minutes of Exercise per Session: 0 min  Stress: No Stress Concern Present (11/18/2021)   [employer] of Occupational Health - Occupational Stress Questionnaire    Feeling of Stress : Not at all  Social Connections: Unknown (11/18/2021)   Social Connection and Isolation Panel [NHANES]    Frequency of Communication with Friends and Family: Three times a week    Frequency of Social Gatherings with Friends and Family: Twice a week    Attends Religious Services: Never    Database Administrator or Organizations: No    Attends Banker Meetings: Never    Marital Status: Not on file  Intimate Partner Violence: Not At Risk (11/18/2021)   Humiliation, Afraid, Rape, and Kick questionnaire    Fear of Current or Ex-Partner: No    Emotionally Abused: No    Physically Abused: No    Sexually Abused: No     Current Outpatient Medications:    Accu-Chek Softclix Lancets lancets, Check BG TID DX:E11.65, LON:99 (Patient not taking: Reported on 08/11/2023), Disp: 100 each,  Rfl: 12   albuterol  (VENTOLIN  HFA) 108 (90 Base) MCG/ACT inhaler, Inhale 2 puffs into the lungs every 6 (six) hours as needed for wheezing or shortness of breath., Disp: 8 g, Rfl: 2   Blood Glucose Monitoring Suppl (ACCU-CHEK GUIDE ME) w/Device KIT, Check BG TID LON:99, DX:E11.65 (Patient not taking: Reported on 08/11/2023), Disp: 1 kit, Rfl: 0   cephALEXin  (KEFLEX ) 500 MG capsule, Take 1 capsule (500 mg total) by mouth 3 (three) times daily. (Patient not taking: Reported on 08/11/2023), Disp: 21 capsule, Rfl: 0   escitalopram  (LEXAPRO ) 10 MG tablet, Take 1 tablet (10 mg total) by mouth daily., Disp: 90 tablet, Rfl: 0   fluconazole  (DIFLUCAN ) 150 MG tablet, Take 1 tablet (150 mg total) by mouth every 3 (three) days as needed (for vaginal  itching/yeast infection sx)., Disp: 2 tablet, Rfl: 0   gabapentin  (NEURONTIN ) 100 MG capsule, Take 1 capsule (100 mg total) by mouth 2 (two) times daily., Disp: 60 capsule, Rfl: 0   glucose blood (ACCU-CHEK GUIDE) test strip, Dx:E11.65, LON:99 check BG tid (Patient not taking: Reported on 08/11/2023), Disp: 100 each, Rfl: 12   hydrochlorothiazide  (HYDRODIURIL ) 12.5 MG tablet, Take 1 tablet (12.5 mg total) by mouth daily., Disp: 90 tablet, Rfl: 1   hydrOXYzine  (ATARAX ) 10 MG tablet, Take 1 tablet (10 mg total) by mouth every 8 (eight) hours as needed for anxiety., Disp: 270 tablet, Rfl: 1   lisinopril  (ZESTRIL ) 40 MG tablet, Take 1 tablet (40 mg total) by mouth daily., Disp: 90 tablet, Rfl: 1   ondansetron  (ZOFRAN -ODT) 4 MG disintegrating tablet, Take 1 tablet (4 mg total) by mouth every 8 (eight) hours as needed for nausea or vomiting., Disp: 15 tablet, Rfl: 0   oxyCODONE -acetaminophen  (PERCOCET/ROXICET) 5-325 MG tablet, Take 1 tablet by mouth every 4 (four) hours as needed for severe pain (pain score 7-10). (Patient not taking: Reported on 08/11/2023), Disp: 15 tablet, Rfl: 0   rosuvastatin  (CRESTOR ) 10 MG tablet, Take 1 tablet (10 mg total) by mouth at bedtime., Disp:  90 tablet, Rfl: 3   Semaglutide ,0.25 or 0.5MG /DOS, (OZEMPIC , 0.25 OR 0.5 MG/DOSE,) 2 MG/3ML SOPN, Inject 0.25 mg into the skin once a week., Disp: 3 mL, Rfl: 0  No Known Allergies   ROS  ***  Objective  There were no vitals filed for this visit.  There is no height or weight on file to calculate BMI.  Physical Exam ***  {Show previous labs (optional):23779}  Assessment & Plan  There are no diagnoses linked to this encounter.  -USPSTF grade A and B recommendations reviewed with patient; age-appropriate recommendations, preventive care, screening tests, etc discussed and encouraged; healthy living encouraged; see AVS for patient education given to patient -Discussed importance of 150 minutes of physical activity weekly, eat two servings of fish weekly, eat one serving of tree nuts ( cashews, pistachios, pecans, almonds.SABRA) every other day, eat 6 servings of fruit/vegetables daily and drink plenty of water and avoid sweet beverages.   -Reviewed Health Maintenance: {yes wn:[id number]}

## 2023-10-03 ENCOUNTER — Encounter: Payer: MEDICAID | Admitting: Internal Medicine

## 2024-01-15 ENCOUNTER — Other Ambulatory Visit: Payer: Self-pay | Admitting: Internal Medicine

## 2024-01-15 DIAGNOSIS — I10 Essential (primary) hypertension: Secondary | ICD-10-CM

## 2024-01-16 NOTE — Telephone Encounter (Signed)
 Requested Prescriptions  Pending Prescriptions Disp Refills   lisinopril  (ZESTRIL ) 40 MG tablet [Pharmacy Med Name: LISINOPRIL  40 MG TABLET] 30 tablet 0    Sig: TAKE 1 TABLET BY MOUTH EVERY DAY     Cardiovascular:  ACE Inhibitors Failed - 01/16/2024  3:31 PM      Failed - Valid encounter within last 6 months    Recent Outpatient Visits   None            Passed - Cr in normal range and within 180 days    Creat  Date Value Ref Range Status  11/18/2021 0.90 0.50 - 1.03 mg/dL Final   Creatinine, Ser  Date Value Ref Range Status  07/25/2023 0.85 0.44 - 1.00 mg/dL Final   Creatinine, Urine  Date Value Ref Range Status  01/12/2023 59 20 - 275 mg/dL Final         Passed - K in normal range and within 180 days    Potassium  Date Value Ref Range Status  07/25/2023 3.6 3.5 - 5.1 mmol/L Final  01/01/2015 3.7 mmol/L Final    Comment:    3.5-5.1 NOTE: New Reference Range  11/25/14          Passed - Patient is not pregnant      Passed - Last BP in normal range    BP Readings from Last 1 Encounters:  08/11/23 124/72          Courtesy refill.

## 2024-02-20 ENCOUNTER — Encounter: Payer: Self-pay | Admitting: Internal Medicine

## 2024-02-20 ENCOUNTER — Other Ambulatory Visit (HOSPITAL_COMMUNITY)
Admission: RE | Admit: 2024-02-20 | Discharge: 2024-02-20 | Disposition: A | Discharge status: Home / Self Care | Payer: MEDICAID | Source: Ambulatory Visit | Attending: Internal Medicine | Admitting: Internal Medicine

## 2024-02-20 ENCOUNTER — Other Ambulatory Visit: Payer: Self-pay

## 2024-02-20 ENCOUNTER — Ambulatory Visit (INDEPENDENT_AMBULATORY_CARE_PROVIDER_SITE_OTHER): Payer: MEDICAID | Admitting: Internal Medicine

## 2024-02-20 VITALS — BP 130/80 | HR 90 | Temp 97.9°F | Resp 16 | Ht 64.0 in | Wt 175.8 lb

## 2024-02-20 DIAGNOSIS — B351 Tinea unguium: Secondary | ICD-10-CM

## 2024-02-20 DIAGNOSIS — Z0000 Encounter for general adult medical examination without abnormal findings: Secondary | ICD-10-CM | POA: Diagnosis present

## 2024-02-20 DIAGNOSIS — Z0001 Encounter for general adult medical examination with abnormal findings: Secondary | ICD-10-CM

## 2024-02-20 DIAGNOSIS — E782 Mixed hyperlipidemia: Secondary | ICD-10-CM | POA: Diagnosis not present

## 2024-02-20 DIAGNOSIS — Z113 Encounter for screening for infections with a predominantly sexual mode of transmission: Secondary | ICD-10-CM

## 2024-02-20 DIAGNOSIS — E1165 Type 2 diabetes mellitus with hyperglycemia: Secondary | ICD-10-CM

## 2024-02-20 DIAGNOSIS — I10 Essential (primary) hypertension: Secondary | ICD-10-CM

## 2024-02-20 MED ORDER — OZEMPIC (0.25 OR 0.5 MG/DOSE) 2 MG/3ML ~~LOC~~ SOPN: 0.5000 mg | PEN_INJECTOR | SUBCUTANEOUS | 1 refills | Status: DC

## 2024-02-20 MED ORDER — ROSUVASTATIN CALCIUM 10 MG PO TABS
10.0000 mg | ORAL_TABLET | Freq: Every day | ORAL | 3 refills | Status: AC
Start: 2024-02-20 — End: ?

## 2024-02-20 MED ORDER — LISINOPRIL 40 MG PO TABS: 40.0000 mg | ORAL_TABLET | Freq: Every day | ORAL | 1 refills | Status: DC

## 2024-02-20 NOTE — Addendum Note (Signed)
 Addended by: Rockney Cid on: 02/20/2024 03:58 PM   Modules accepted: Level of Service

## 2024-02-20 NOTE — Progress Notes (Signed)
 Name: Kelly [name]   MRN: [id number]    DOB: 03/05/1971   Date:02/20/2024       Progress Note  Subjective  Chief Complaint  Chief Complaint  Patient presents with   Annual Exam    HPI  Patient presents for annual CPE and follow up on chronic medical conditions.   Discussed the use of AI scribe software for clinical note transcription with the patient, who gave verbal consent to proceed.  History of Present Illness Kelly [name] is a 53 year old female with type 2 diabetes mellitus who presents for a routine physical exam and medication review.  She is on a low dose of Ozempic  for diabetes, with her last A1c at 11.4 in January. She experiences numbness and tingling in her feet and notices a darkening of her toenail.  She has high cholesterol and was previously on Crestor , which she has run out of. Her cholesterol levels remain elevated. She takes lisinopril  for hypertension and was previously on chlorothiazide.  She discontinued Lexapro  for anxiety and depression to assess her condition without it.  She requests testing for HIV and other STDs, including syphilis, gonorrhea, chlamydia, and trichomonas.   Diet: Regular Exercise: 5 days 2 hours  Last Eye Exam: will schedule Last Dental Exam: will schedule  Flowsheet Row Office Visit from 02/20/2024 in [facility]  AUDIT-C Score 0      Depression: Phq 9 is  negative    02/20/2024    1:34 PM 08/11/2023    9:40 AM 02/16/2023    3:10 PM 01/12/2023    2:41 PM 09/02/2022    3:33 PM  Depression screen PHQ 2/9  Decreased Interest 0 0 2 0 0  Down, Depressed, Hopeless 0 0 2 0 0  PHQ - 2 Score 0 0 4 0 0  Altered sleeping 0  2 0 0  Tired, decreased energy 0  1 0 0  Change in appetite 0  0 0 0  Feeling bad or failure about yourself  0  0 0 0  Trouble concentrating 0  0 0 0  Moving slowly or fidgety/restless 0  0 0 0  Suicidal thoughts 0  0 0 0  PHQ-9 Score 0  7 0 0  Difficult doing work/chores Not  difficult at all  Somewhat difficult Not difficult at all Not difficult at all   Hypertension: BP Readings from Last 3 Encounters:  02/20/24 130/80  08/11/23 124/72  07/25/23 (!) 140/97   Obesity: Wt Readings from Last 3 Encounters:  02/20/24 175 lb 12.8 oz (79.7 kg)  08/11/23 182 lb 1.6 oz (82.6 kg)  07/25/23 181 lb (82.1 kg)   BMI Readings from Last 3 Encounters:  02/20/24 30.18 kg/m  08/11/23 31.26 kg/m  07/25/23 29.21 kg/m     Vaccines: reviewed with the patient.   Hep C Screening: completed STD testing and prevention (HIV/chl/gon/syphilis): yes Intimate partner violence: negative screen  Sexual History : active Menstrual History/LMP/Abnormal Bleeding: history of total Hysterectomy 2010 due to fibroids  Discussed importance of follow up if any post-menopausal bleeding: no  Incontinence Symptoms: negative for symptoms   Breast cancer:  - Last Mammogram:  will schedule  Osteoporosis Prevention : Discussed high calcium  and vitamin D supplementation, weight bearing exercises Bone density :not applicable   Cervical cancer screening: not applicable due to hysterectomy  Skin cancer: Discussed monitoring for atypical lesions  Colorectal cancer: will schedule   Lung cancer:  Low Dose CT Chest recommended if  Age 9-80 years, 20 pack-year currently smoking OR have quit w/in 15years. Patient does not qualify for screen      Patient Active Problem List   Diagnosis Date Noted   Chronic active hepatitis C (HCC) 09/30/2019   Type 2 diabetes mellitus with hyperglycemia (HCC) 09/30/2019   Recurrent major depressive episodes, moderate (HCC) 06/15/2019   Transient cerebral ischemia 06/14/2019   Diabetes mellitus without complication (HCC) 07/11/2017   Hypertension 07/11/2017   Anxiety 01/19/2012    Past Surgical History:  Procedure Laterality Date   ABDOMINAL HYSTERECTOMY     GALLBLADDER SURGERY      Family History  Problem Relation Age of Onset   Breast cancer  Mother     Social History   Socioeconomic History   Marital status: Single    Spouse name: Not on file   Number of children: 1   Years of education: Not on file   Highest education level: Not on file  Occupational History   Occupation: Public affairs consultant    Comment: cleaning  Tobacco Use   Smoking status: Never   Smokeless tobacco: Never  Vaping Use   Vaping status: Never Used  Substance and Sexual Activity   Alcohol use: Not Currently    Comment: Socially    Drug use: No   Sexual activity: Yes    Partners: Male    Birth control/protection: Surgical  Other Topics Concern   Not on file  Social History Narrative   Daughter lives with patient   Social Drivers of Health   Financial Resource Strain: Low Risk  (02/20/2024)   Overall Financial Resource Strain (CARDIA)    Difficulty of Paying Living Expenses: Not hard at all  Food Insecurity: Unknown (02/20/2024)   Hunger Vital Sign    Worried About Running Out of Food in the Last Year: Never true    Ran Out of Food in the Last Year: Not on file  Transportation Needs: No Transportation Needs (02/20/2024)   PRAPARE - Administrator, Civil Service (Medical): No    Lack of Transportation (Non-Medical): No  Physical Activity: Inactive (11/18/2021)   Exercise Vital Sign    Days of Exercise per Week: 0 days    Minutes of Exercise per Session: 0 min  Stress: No Stress Concern Present (02/20/2024)   [employer] of Occupational Health - Occupational Stress Questionnaire    Feeling of Stress : Only a little  Social Connections: Socially Isolated (02/20/2024)   Social Connection and Isolation Panel [NHANES]    Frequency of Communication with Friends and Family: More than three times a week    Frequency of Social Gatherings with Friends and Family: More than three times a week    Attends Religious Services: Never    Database administrator or Organizations: No    Attends Banker Meetings: Never    Marital  Status: Never married  Intimate Partner Violence: Not At Risk (02/20/2024)   Humiliation, Afraid, Rape, and Kick questionnaire    Fear of Current or Ex-Partner: No    Emotionally Abused: No    Physically Abused: No    Sexually Abused: No     Current Outpatient Medications:    albuterol  (VENTOLIN  HFA) 108 (90 Base) MCG/ACT inhaler, Inhale 2 puffs into the lungs every 6 (six) hours as needed for wheezing or shortness of breath., Disp: 8 g, Rfl: 2   escitalopram  (LEXAPRO ) 10 MG tablet, Take 1 tablet (10 mg total) by mouth daily., Disp: 90 tablet,  Rfl: 0   gabapentin  (NEURONTIN ) 100 MG capsule, Take 1 capsule (100 mg total) by mouth 2 (two) times daily., Disp: 60 capsule, Rfl: 0   hydrochlorothiazide  (HYDRODIURIL ) 12.5 MG tablet, Take 1 tablet (12.5 mg total) by mouth daily., Disp: 90 tablet, Rfl: 1   hydrOXYzine  (ATARAX ) 10 MG tablet, Take 1 tablet (10 mg total) by mouth every 8 (eight) hours as needed for anxiety., Disp: 270 tablet, Rfl: 1   ondansetron  (ZOFRAN -ODT) 4 MG disintegrating tablet, Take 1 tablet (4 mg total) by mouth every 8 (eight) hours as needed for nausea or vomiting., Disp: 15 tablet, Rfl: 0   rosuvastatin  (CRESTOR ) 10 MG tablet, Take 1 tablet (10 mg total) by mouth at bedtime., Disp: 90 tablet, Rfl: 3   Semaglutide ,0.25 or 0.5MG /DOS, (OZEMPIC , 0.25 OR 0.5 MG/DOSE,) 2 MG/3ML SOPN, Inject 0.25 mg into the skin once a week., Disp: 3 mL, Rfl: 0   Accu-Chek Softclix Lancets lancets, Check BG TID DX:E11.65, LON:99 (Patient not taking: Reported on 02/20/2024), Disp: 100 each, Rfl: 12   Blood Glucose Monitoring Suppl (ACCU-CHEK GUIDE ME) w/Device KIT, Check BG TID LON:99, DX:E11.65 (Patient not taking: Reported on 02/20/2024), Disp: 1 kit, Rfl: 0   cephALEXin  (KEFLEX ) 500 MG capsule, Take 1 capsule (500 mg total) by mouth 3 (three) times daily. (Patient not taking: Reported on 02/20/2024), Disp: 21 capsule, Rfl: 0   fluconazole  (DIFLUCAN ) 150 MG tablet, Take 1 tablet (150 mg total) by mouth  every 3 (three) days as needed (for vaginal itching/yeast infection sx). (Patient not taking: Reported on 02/20/2024), Disp: 2 tablet, Rfl: 0   glucose blood (ACCU-CHEK GUIDE) test strip, Dx:E11.65, LON:99 check BG tid (Patient not taking: Reported on 02/20/2024), Disp: 100 each, Rfl: 12   lisinopril  (ZESTRIL ) 40 MG tablet, TAKE 1 TABLET BY MOUTH EVERY DAY, Disp: 30 tablet, Rfl: 0   oxyCODONE -acetaminophen  (PERCOCET/ROXICET) 5-325 MG tablet, Take 1 tablet by mouth every 4 (four) hours as needed for severe pain (pain score 7-10). (Patient not taking: Reported on 02/20/2024), Disp: 15 tablet, Rfl: 0  No Known Allergies   Review of Systems  Neurological:  Positive for tingling.    Objective  Vitals:   02/20/24 1341  BP: 130/80  Pulse: 90  Resp: 16  Temp: 97.9 F (36.6 C)  TempSrc: Oral  SpO2: 99%  Weight: 175 lb 12.8 oz (79.7 kg)  Height: 5\' 4"  (1.626 m)    Body mass index is 30.18 kg/m.  Physical Exam Constitutional:      Appearance: Normal appearance.  HENT:     Head: Normocephalic and atraumatic.     Mouth/Throat:     Mouth: Mucous membranes are moist.     Pharynx: Oropharynx is clear.  Eyes:     Extraocular Movements: Extraocular movements intact.     Conjunctiva/sclera: Conjunctivae normal.     Pupils: Pupils are equal, round, and reactive to light.  Cardiovascular:     Rate and Rhythm: Normal rate and regular rhythm.     Pulses:          Dorsalis pedis pulses are 2+ on the right side and 2+ on the left side.  Pulmonary:     Effort: Pulmonary effort is normal.     Breath sounds: Normal breath sounds.  Musculoskeletal:     Right foot: Normal range of motion. No deformity, bunion, Charcot foot, foot drop or prominent metatarsal heads.     Left foot: Normal range of motion. No deformity, bunion, Charcot foot, foot drop or prominent metatarsal  heads.  Feet:     Right foot:     Protective Sensation: 6 sites tested.  6 sites sensed.     Skin integrity: Skin integrity  normal.     Toenail Condition: Right toenails are normal.     Left foot:     Protective Sensation: 6 sites tested.  6 sites sensed.     Skin integrity: Skin integrity normal.     Toenail Condition: Left toenails are normal.  Skin:    General: Skin is warm and dry.  Neurological:     General: No focal deficit present.     Mental Status: She is alert. Mental status is at baseline.  Psychiatric:        Mood and Affect: Mood normal.        Behavior: Behavior normal.     Last CBC Lab Results  Component Value Date   WBC 7.3 07/25/2023   HGB 12.2 07/25/2023   HCT 36.6 07/25/2023   MCV 81.0 07/25/2023   MCH 27.0 07/25/2023   RDW 12.7 07/25/2023   PLT 247 07/25/2023   Last metabolic panel Lab Results  Component Value Date   GLUCOSE 252 (H) 07/25/2023   NA 135 07/25/2023   K 3.6 07/25/2023   CL 103 07/25/2023   CO2 24 07/25/2023   BUN 16 07/25/2023   CREATININE 0.85 07/25/2023   GFRNONAA >60 07/25/2023   CALCIUM  9.1 07/25/2023   PROT 7.9 07/25/2023   ALBUMIN 3.6 07/25/2023   BILITOT 0.6 07/25/2023   ALKPHOS 52 07/25/2023   AST 12 (L) 07/25/2023   ALT 13 07/25/2023   ANIONGAP 8 07/25/2023   Last lipids Lab Results  Component Value Date   CHOL 202 (H) 11/18/2021   HDL 53 11/18/2021   LDLCALC 126 (H) 11/18/2021   TRIG 124 11/18/2021   CHOLHDL 3.8 11/18/2021   Last hemoglobin A1c Lab Results  Component Value Date   HGBA1C 10.5 (A) 01/12/2023   Last thyroid  functions Lab Results  Component Value Date   TSH 1.446 07/12/2017   Last vitamin D No results found for: "25OHVITD2", "25OHVITD3", "VD25OH" Last vitamin B12 and Folate No results found for: "VITAMINB12", "FOLATE"    Assessment & Plan  Assessment and Plan Assessment & Plan Type 2 Diabetes Mellitus w/Hyperglycemia  Poorly controlled with A1c of 11.4%. Possible diabetic neuropathy indicated by numbness and tingling in feet. Discussed importance of glycemic control to prevent complications. - Increase  Ozempic  to 0.5 mg weekly. - Order A1c, kidney, liver, and electrolyte labs. - Perform diabetic foot exam. - Schedule follow-up in 3 months to recheck A1c and adjust treatment as needed.  Hyperlipidemia Previously high cholesterol levels. Restarting Crestor  necessary for management. - Restart Crestor  for cholesterol management.  Hypertension Controlled with lisinopril . Lisinopril  preferred for renal protection in diabetes context. - Restart lisinopril  for blood pressure management.  Onychomycosis Possible onychomycosis with toenail darkening. Discussed treatment options and importance of blood sugar control. - Recommend over-the-counter topical terbinafine (Lamisil). - Emphasize consistent application for 6-12 months.  General Health Maintenance Up to date on tetanus and pneumonia vaccines. Mammogram and colon cancer screening due. - Schedule mammogram. - Arrange colonoscopy for colon cancer screening.  Sexually Transmitted Infection Screening Requested STI screening including HIV testing. Agreed to testing as part of routine labs. - Order HIV and syphilis tests. - Perform vaginal swab for gonorrhea, chlamydia, and trichomonas.  - Ambulatory referral to Gastroenterology - MM 3D SCREENING MAMMOGRAM BILATERAL BREAST; Future - Comprehensive Metabolic Panel (CMET) - HgB  A1c - HM Diabetes Foot Exam - Semaglutide ,0.25 or 0.5MG /DOS, (OZEMPIC , 0.25 OR 0.5 MG/DOSE,) 2 MG/3ML SOPN; Inject 0.5 mg into the skin once a week.  Dispense: 6 mL; Refill: 1 - lisinopril  (ZESTRIL ) 40 MG tablet; Take 1 tablet (40 mg total) by mouth daily.  Dispense: 90 tablet; Refill: 1 - rosuvastatin  (CRESTOR ) 10 MG tablet; Take 1 tablet (10 mg total) by mouth at bedtime.  Dispense: 90 tablet; Refill: 3 - HIV antibody (with reflex) - RPR - Cervicovaginal ancillary only   -USPSTF grade A and B recommendations reviewed with patient; age-appropriate recommendations, preventive care, screening tests, etc discussed  and encouraged; healthy living encouraged; see AVS for patient education given to patient -Discussed importance of 150 minutes of physical activity weekly, eat two servings of fish weekly, eat one serving of tree nuts ( cashews, pistachios, pecans, almonds.Aaron Aas) every other day, eat 6 servings of fruit/vegetables daily and drink plenty of water and avoid sweet beverages.   -Reviewed Health Maintenance: Yes.

## 2024-02-21 ENCOUNTER — Other Ambulatory Visit (HOSPITAL_COMMUNITY): Payer: Self-pay

## 2024-02-21 ENCOUNTER — Telehealth: Payer: Self-pay | Admitting: Pharmacy Technician

## 2024-02-21 LAB — COMPREHENSIVE METABOLIC PANEL WITH GFR
AG Ratio: 1.1 (calc) (ref 1.0–2.5)
ALT: 11 U/L (ref 6–29)
AST: 12 U/L (ref 10–35)
Albumin: 4.2 g/dL (ref 3.6–5.1)
Alkaline phosphatase (APISO): 61 U/L (ref 37–153)
BUN: 13 mg/dL (ref 7–25)
CO2: 27 mmol/L (ref 20–32)
Calcium: 9.7 mg/dL (ref 8.6–10.4)
Chloride: 103 mmol/L (ref 98–110)
Creat: 1 mg/dL (ref 0.50–1.03)
Globulin: 3.7 g/dL (ref 1.9–3.7)
Glucose, Bld: 248 mg/dL — ABNORMAL HIGH (ref 65–99)
Potassium: 4.8 mmol/L (ref 3.5–5.3)
Sodium: 138 mmol/L (ref 135–146)
Total Bilirubin: 0.4 mg/dL (ref 0.2–1.2)
Total Protein: 7.9 g/dL (ref 6.1–8.1)
eGFR: 68 mL/min/{1.73_m2} (ref >=60)

## 2024-02-21 LAB — RPR: RPR Ser Ql: NONREACTIVE

## 2024-02-21 LAB — HEMOGLOBIN A1C
Hgb A1c MFr Bld: 10.9 % — ABNORMAL HIGH (ref <5.7)
Mean Plasma Glucose: 266 mg/dL
eAG (mmol/L): 14.7 mmol/L

## 2024-02-21 LAB — HIV ANTIBODY (ROUTINE TESTING W REFLEX): HIV 1&2 Ab, 4th Generation: NONREACTIVE

## 2024-02-21 NOTE — Telephone Encounter (Signed)
 Pharmacy Patient Advocate Encounter   Received notification from CoverMyMeds that prior authorization for Ozempic  (0.25 or 0.5 MG/DOSE) 2MG /3ML pen-injectors is required/requested.   Insurance verification completed.   The patient is insured through [employer] .   Per test claim: PA required; PA submitted to above mentioned insurance via CoverMyMeds Key/confirmation #/EOC [id number] Status is pending

## 2024-02-22 ENCOUNTER — Other Ambulatory Visit (HOSPITAL_COMMUNITY): Payer: Self-pay

## 2024-02-22 LAB — CERVICOVAGINAL ANCILLARY ONLY
Bacterial Vaginitis (gardnerella): POSITIVE — AB
Candida Glabrata: NEGATIVE
Candida Vaginitis: NEGATIVE
Chlamydia: NEGATIVE
Comment: NEGATIVE
Comment: NEGATIVE
Comment: NEGATIVE
Comment: NEGATIVE
Comment: NEGATIVE
Comment: NORMAL
Neisseria Gonorrhea: NEGATIVE
Trichomonas: NEGATIVE

## 2024-02-22 NOTE — Telephone Encounter (Signed)
 Pharmacy Patient Advocate Encounter  Received notification from [facility] that Prior Authorization for Ozempic  (0.25 or 0.5 MG/DOSE) 2MG /3ML pen-injectors has been APPROVED from 02/22/24 to 02/21/25. Ran test claim, Copay is $4.00. This test claim was processed through [facility]- copay amounts may vary at other pharmacies due to pharmacy/plan contracts, or as the patient moves through the different stages of their insurance plan.   PA #/Case ID/Reference #: [id number]

## 2024-02-23 ENCOUNTER — Other Ambulatory Visit (HOSPITAL_COMMUNITY): Payer: Self-pay

## 2024-02-23 ENCOUNTER — Ambulatory Visit: Payer: Self-pay | Admitting: Internal Medicine

## 2024-03-01 ENCOUNTER — Telehealth: Payer: Self-pay

## 2024-03-01 ENCOUNTER — Other Ambulatory Visit: Payer: Self-pay | Admitting: Internal Medicine

## 2024-03-01 DIAGNOSIS — B9689 Other specified bacterial agents as the cause of diseases classified elsewhere: Secondary | ICD-10-CM

## 2024-03-01 MED ORDER — METRONIDAZOLE 500 MG PO TABS: 500.0000 mg | ORAL_TABLET | Freq: Two times a day (BID) | ORAL | 0 refills | Status: AC

## 2024-03-01 NOTE — Telephone Encounter (Signed)
 Pt notified- verbalized understanding.

## 2024-03-01 NOTE — Telephone Encounter (Signed)
 Copied from CRM [id number]. Topic: Clinical - Medication Question >> Mar 01, 2024  8:18 AM Carlatta H wrote: Reason for CRM: Patient is asking for a medication to called in for a yeast infection or BV//Symptom for 1 week//Please call patient advise//

## 2024-05-22 ENCOUNTER — Ambulatory Visit: Payer: MEDICAID | Admitting: Internal Medicine

## 2024-07-15 ENCOUNTER — Ambulatory Visit: Payer: Self-pay

## 2024-07-15 NOTE — Telephone Encounter (Signed)
 FYI Only or Action Required?: FYI only for provider.  Patient was last seen in primary care on 02/20/2024 by Bernardo Fend, DO.  Called Nurse Triage reporting Abdominal Pain and Vaginal Discharge.  Symptoms began a week ago.  Interventions attempted: Nothing.  Symptoms are: gradually worsening.  Triage Disposition: See Physician Within 24 Hours  Patient/caregiver understands and will follow disposition?: Yes                            Copied from CRM [id number]. Topic: Clinical - Pink Word Triage >> Jul 15, 2024  8:25 AM Everette C wrote: Pink Word triggered transfer to Nurse Triage. See Triage Message for details. >> Jul 15, 2024  8:40 AM Antwanette L wrote: Patient is calling back and would to come in today at 3:30pm >> Jul 15, 2024  8:26 AM Everette C wrote: Reason for Triage: The patient shares that they have began to experience stomach discomfort, vaginal discharge and an odor that is atypical. The patient shares that they've experienced symptoms for roughly one week. Please contact further Reason for Disposition  [1] Mild lower abdominal pain comes and goes (cramps) AND [2] lasts > 24 hours  Answer Assessment - Initial Assessment Questions 1. DISCHARGE: Describe the discharge. (e.g., white, yellow, green, gray, foamy, cottage cheese-like)     Thick, brown 2. ODOR: Is there a bad odor?     Yes, started a week ago 3. ONSET: When did the discharge begin?     2 days ago 4. RASH: Is there a rash in the genital area? If Yes, ask: Describe it. (e.g., redness, blisters, sores, bumps)     Denies 5. ABDOMEN PAIN: Are you having any abdomen pain? If Yes, ask: What does it feel like?  (e.g., crampy, dull, intermittent, constant)      Mild, crampy, intermittent  6. ABDOMEN PAIN SEVERITY: If present, ask: How bad is it? (e.g., Scale 1-10; mild, moderate, or severe)     Rates pain 5-6 7. CAUSE: What do you think is causing the discharge?  Have you had the same problem before? What happened then?     Requesting STD testing 8. OTHER SYMPTOMS: Do you have any other symptoms? (e.g., fever, itching, urination pain, vaginal bleeding, vaginal foreign body)     Denies fever, denies painful urination, denies bleeding, denies vaginal pain, denies itching   9. PREGNANCY: Is there any chance you are pregnant? When was your last menstrual period?     Denies  Protocols used: Vaginal Discharge-A-AH

## 2024-07-16 ENCOUNTER — Ambulatory Visit: Payer: MEDICAID | Admitting: Internal Medicine

## 2024-07-16 ENCOUNTER — Other Ambulatory Visit (HOSPITAL_COMMUNITY)
Admission: RE | Admit: 2024-07-16 | Discharge: 2024-07-16 | Disposition: A | Discharge status: Home / Self Care | Payer: MEDICAID | Source: Ambulatory Visit | Attending: Internal Medicine | Admitting: Internal Medicine

## 2024-07-16 ENCOUNTER — Other Ambulatory Visit: Payer: Self-pay

## 2024-07-16 ENCOUNTER — Encounter: Payer: Self-pay | Admitting: Internal Medicine

## 2024-07-16 VITALS — BP 128/84 | HR 88 | Temp 97.8°F | Resp 16 | Ht 64.0 in | Wt 183.7 lb

## 2024-07-16 DIAGNOSIS — N898 Other specified noninflammatory disorders of vagina: Secondary | ICD-10-CM | POA: Insufficient documentation

## 2024-07-16 DIAGNOSIS — Z113 Encounter for screening for infections with a predominantly sexual mode of transmission: Secondary | ICD-10-CM | POA: Diagnosis present

## 2024-07-16 DIAGNOSIS — E1165 Type 2 diabetes mellitus with hyperglycemia: Secondary | ICD-10-CM | POA: Diagnosis not present

## 2024-07-16 DIAGNOSIS — I10 Essential (primary) hypertension: Secondary | ICD-10-CM | POA: Diagnosis not present

## 2024-07-16 DIAGNOSIS — F419 Anxiety disorder, unspecified: Secondary | ICD-10-CM | POA: Diagnosis not present

## 2024-07-16 DIAGNOSIS — Z7985 Long-term (current) use of injectable non-insulin antidiabetic drugs: Secondary | ICD-10-CM

## 2024-07-16 DIAGNOSIS — E782 Mixed hyperlipidemia: Secondary | ICD-10-CM

## 2024-07-16 DIAGNOSIS — K1370 Unspecified lesions of oral mucosa: Secondary | ICD-10-CM

## 2024-07-16 MED ORDER — HYDROXYZINE PAMOATE 25 MG PO CAPS: 25.0000 mg | ORAL_CAPSULE | Freq: Every evening | ORAL | 0 refills | Status: AC | PRN

## 2024-07-16 NOTE — Progress Notes (Signed)
 Established Office Visit  Subjective:     Patient ID: Kelly [name], female    DOB: 1971-06-27, 53 y.o.   MRN: [id number]  Chief Complaint  Patient presents with   Vaginal Discharge    Odor  for 1 week   Mouth Lesions    2 spots inside mouth    Vaginal Discharge The patient's primary symptoms include vaginal discharge.  Mouth Lesions  Associated symptoms include mouth sores.   Patient is in today for oral lesions and changes in vaginal discharge.   Discussed the use of AI scribe software for clinical note transcription with the patient, who gave verbal consent to proceed.  History of Present Illness Kelly [name] is a 52 year old female who presents with oral sores and vaginal discharge.  She has had two painful oral sores for approximately one week, with the right side being slightly worse. A swab of the sores has been taken for further analysis.  She experiences changes in vaginal discharge, described as 'whitish brownish' with a fishy smell. A vaginal swab has been performed to check for bacterial vaginosis, yeast, trichomonas, gonorrhea, and other infections.  She has elevated A1c levels, with a previous reading of 10.9 in June. She was prescribed Ozempic  but discontinued it due to severe headaches. She is currently taking lisinopril  and Crestor  for cholesterol. She also uses an inhaler and hydroxyzine  for anxiety and sleep issues. She is not sleeping well and has requested an increase in her hydroxyzine  dosage from 10 mg to 25 mg.   Diabetes, Type 2: -Last A1c 6/25 10.9% -Medications: Tried to prescribe Mounjaro , was not covered, also had an insurance issue with Trulicity . Had been on Ozempic  over the summer but stopped it due to headaches.  -Checking BG at home: Not checking -Eye exam: Due -Foot exam: UTD  -Microalbumin: Due -Statin: Yes -PNA vaccine: Prevnar 23 in 2020 -Denies symptoms of hypoglycemia, polyuria, polydipsia.  Hypertension: -Medications:  Lisinopril  40 mg  -Patient is compliant with above medications and reports no side effects. -Denies any SOB, CP, vision changes, LE edema or symptoms of hypotension  HLD/Hx of TIA: -Medications: Crestor  10 mg -Last lipid panel: Lipid Panel     Component Value Date/Time   CHOL 202 (H) 11/18/2021 1425   TRIG 124 11/18/2021 1425   HDL 53 11/18/2021 1425   CHOLHDL 3.8 11/18/2021 1425   VLDL 37 06/15/2019 0614   LDLCALC 126 (H) 11/18/2021 1425    MDD: -Mood status: uncontrolled -Current treatment: Hydroxyzine  as needed but states she needs the dose increased, still having trouble sleeping -Had been on Lexapro , uncertain why this was discontinued  -Satisfied with current treatment?: no Previous psychiatric medications: none Depressed mood: yes Anxious mood: yes     02/20/2024    1:34 PM 08/11/2023    9:40 AM 02/16/2023    3:10 PM 01/12/2023    2:41 PM 09/02/2022    3:33 PM  Depression screen PHQ 2/9  Decreased Interest 0 0 2 0 0  Down, Depressed, Hopeless 0 0 2 0 0  PHQ - 2 Score 0 0 4 0 0  Altered sleeping 0  2 0 0  Tired, decreased energy 0  1 0 0  Change in appetite 0  0 0 0  Feeling bad or failure about yourself  0  0 0 0  Trouble concentrating 0  0 0 0  Moving slowly or fidgety/restless 0  0 0 0  Suicidal thoughts 0  0 0 0  PHQ-9 Score 0  7 0 0  Difficult doing work/chores Not difficult at all  Somewhat difficult Not difficult at all Not difficult at all   Health Maintenance: -Blood work due -Mammogram due, ordered previously, can call to schedule   Review of Systems  HENT:  Positive for mouth sores.   Genitourinary:  Positive for vaginal discharge.  All other systems reviewed and are negative.       Objective:    BP 128/84 (Cuff Size: Large)   Pulse 88   Temp 97.8 F (36.6 C) (Oral)   Resp 16   Ht 5' 4 (1.626 m)   Wt 183 lb 11.2 oz (83.3 kg)   SpO2 99%   BMI 31.53 kg/m  BP Readings from Last 3 Encounters:  07/16/24 128/84  02/20/24 130/80   08/11/23 124/72   Wt Readings from Last 3 Encounters:  07/16/24 183 lb 11.2 oz (83.3 kg)  02/20/24 175 lb 12.8 oz (79.7 kg)  08/11/23 182 lb 1.6 oz (82.6 kg)      Physical Exam Constitutional:      Appearance: Normal appearance.  HENT:     Head: Normocephalic and atraumatic.     Mouth/Throat:     Lips: Lesions present.     Mouth: Mucous membranes are moist.  Eyes:     Conjunctiva/sclera: Conjunctivae normal.  Cardiovascular:     Rate and Rhythm: Normal rate and regular rhythm.  Pulmonary:     Effort: Pulmonary effort is normal.     Breath sounds: Normal breath sounds.  Skin:    General: Skin is warm and dry.  Neurological:     General: No focal deficit present.     Mental Status: She is alert. Mental status is at baseline.  Psychiatric:        Mood and Affect: Mood normal.        Behavior: Behavior normal.     No results found for any visits on 07/16/24.      Assessment & Plan:   Assessment & Plan Oral mucosal ulcers Presence of two oral bumps with soreness. Differential includes aphthous ulcers, syphilis, HSV-related lesions. - Screen for syphilis and HSV with swab sample from the ulcer.  Abnormal vaginal discharge Brownish discharge with fishy odor. Differential includes bacterial vaginosis, yeast infection, trichomoniasis. - Await results of vaginal swab to determine cause.  Type 2 diabetes mellitus, uncontrolled A1c was 10.9 in June, indicating poor control. Stopped Ozempic  due to headaches. Discussed potential insulin  need if A1c remains high. - Order A1c test to assess current control. - Perform urine test for kidney function. - Discuss medication options based on A1c, including insulin  therapy.  Insomnia and anxiety Significant insomnia and anxiety, especially at night. Current hydroxyzine  10 mg insufficient. - Prescribe hydroxyzine  25 mg for management.  Hyperlipidemia Managed with Crestor . - Check lipid panel to assess cholesterol  levels.  General Health Maintenance Flu vaccine received. Tetanus and pneumonia vaccinations up to date. Mammogram ordered in June, not completed. - Encourage scheduling of mammogram at [facility] breast cancer screening place.  - HgB A1c - Urine Microalbumin w/creat. ratio - CBC w/Diff/Platelet - Comprehensive Metabolic Panel (CMET) - Lipid Profile - hydrOXYzine  (VISTARIL ) 25 MG capsule; Take 1 capsule (25 mg total) by mouth at bedtime as needed for anxiety.  Dispense: 90 capsule; Refill: 0 - Cytology (oral, anal, urethral) ancillary only - Cervicovaginal ancillary only - HIV antibody (with reflex) - RPR - HSV 1/2 Ab (IgM), IFA w/rflx Titer   Return in about  4 weeks (around 08/13/2024).  Sharyle Fischer, DO

## 2024-07-17 ENCOUNTER — Ambulatory Visit: Payer: Self-pay | Admitting: Internal Medicine

## 2024-07-17 DIAGNOSIS — B9689 Other specified bacterial agents as the cause of diseases classified elsewhere: Secondary | ICD-10-CM

## 2024-07-17 DIAGNOSIS — E1165 Type 2 diabetes mellitus with hyperglycemia: Secondary | ICD-10-CM

## 2024-07-17 DIAGNOSIS — B009 Herpesviral infection, unspecified: Secondary | ICD-10-CM

## 2024-07-17 DIAGNOSIS — B379 Candidiasis, unspecified: Secondary | ICD-10-CM

## 2024-07-17 LAB — CERVICOVAGINAL ANCILLARY ONLY
Bacterial Vaginitis (gardnerella): POSITIVE — AB
Candida Glabrata: NEGATIVE
Candida Vaginitis: POSITIVE — AB
Chlamydia: NEGATIVE
Comment: NEGATIVE
Comment: NEGATIVE
Comment: NEGATIVE
Comment: NEGATIVE
Comment: NEGATIVE
Comment: NORMAL
Neisseria Gonorrhea: NEGATIVE
Trichomonas: NEGATIVE

## 2024-07-17 LAB — CBC WITH DIFFERENTIAL/PLATELET
Absolute Lymphocytes: 2641 {cells}/uL (ref 850–3900)
Absolute Monocytes: 484 {cells}/uL (ref 200–950)
Basophils Absolute: 37 {cells}/uL (ref 0–200)
Basophils Relative: 0.6 %
Eosinophils Absolute: 279 {cells}/uL (ref 15–500)
Eosinophils Relative: 4.5 %
HCT: 39 % (ref 35.0–45.0)
Hemoglobin: 12.4 g/dL (ref 11.7–15.5)
MCH: 26.8 pg — ABNORMAL LOW (ref 27.0–33.0)
MCHC: 31.8 g/dL — ABNORMAL LOW (ref 32.0–36.0)
MCV: 84.4 fL (ref 80.0–100.0)
MPV: 12.1 fL (ref 7.5–12.5)
Monocytes Relative: 7.8 %
Neutro Abs: 2759 {cells}/uL (ref 1500–7800)
Neutrophils Relative %: 44.5 %
Platelets: 281 Thousand/uL (ref 140–400)
RBC: 4.62 Million/uL (ref 3.80–5.10)
RDW: 13.1 % (ref 11.0–15.0)
Total Lymphocyte: 42.6 %
WBC: 6.2 Thousand/uL (ref 3.8–10.8)

## 2024-07-17 LAB — RPR: RPR Ser Ql: NONREACTIVE

## 2024-07-17 LAB — COMPREHENSIVE METABOLIC PANEL WITH GFR
AG Ratio: 1.1 (calc) (ref 1.0–2.5)
ALT: 13 U/L (ref 6–29)
AST: 13 U/L (ref 10–35)
Albumin: 3.9 g/dL (ref 3.6–5.1)
Alkaline phosphatase (APISO): 60 U/L (ref 37–153)
BUN: 14 mg/dL (ref 7–25)
CO2: 27 mmol/L (ref 20–32)
Calcium: 9.5 mg/dL (ref 8.6–10.4)
Chloride: 103 mmol/L (ref 98–110)
Creat: 0.96 mg/dL (ref 0.50–1.03)
Globulin: 3.4 g/dL (ref 1.9–3.7)
Glucose, Bld: 237 mg/dL — ABNORMAL HIGH (ref 65–99)
Potassium: 4.1 mmol/L (ref 3.5–5.3)
Sodium: 138 mmol/L (ref 135–146)
Total Bilirubin: 0.4 mg/dL (ref 0.2–1.2)
Total Protein: 7.3 g/dL (ref 6.1–8.1)
eGFR: 71 mL/min/1.73m2 (ref >=60)

## 2024-07-17 LAB — MICROALBUMIN / CREATININE URINE RATIO
Creatinine, Urine: 58 mg/dL (ref 20–275)
Microalb Creat Ratio: 2366 mg/g{creat} — ABNORMAL HIGH (ref <30)
Microalb, Ur: 137.2 mg/dL

## 2024-07-17 LAB — LIPID PANEL
Cholesterol: 201 mg/dL — ABNORMAL HIGH (ref <200)
HDL: 57 mg/dL (ref >=50)
LDL Cholesterol (Calc): 118 mg/dL — ABNORMAL HIGH
Non-HDL Cholesterol (Calc): 144 mg/dL — ABNORMAL HIGH (ref <130)
Total CHOL/HDL Ratio: 3.5 (calc) (ref <5.0)
Triglycerides: 148 mg/dL (ref <150)

## 2024-07-17 LAB — HERPES SIMPLEX VIRUS 1 AND 2 (IGG),REFLEX HSV-2 INHIBITION
HSV 1 IGG,TYPE SPECIFIC AB: 39.9 {index} — ABNORMAL HIGH
HSV 2 IGG,TYPE SPECIFIC AB: 22 {index} — ABNORMAL HIGH

## 2024-07-17 LAB — HEMOGLOBIN A1C
Hgb A1c MFr Bld: 10 % — ABNORMAL HIGH (ref <5.7)
Mean Plasma Glucose: 240 mg/dL
eAG (mmol/L): 13.3 mmol/L

## 2024-07-17 LAB — CYTOLOGY, (ORAL, ANAL, URETHRAL) ANCILLARY ONLY
Chlamydia: NEGATIVE
Comment: NEGATIVE
Comment: NEGATIVE
Comment: NORMAL
Neisseria Gonorrhea: NEGATIVE
Trichomonas: NEGATIVE

## 2024-07-17 LAB — HIV ANTIBODY (ROUTINE TESTING W REFLEX)
HIV 1&2 Ab, 4th Generation: NONREACTIVE
HIV FINAL INTERPRETATION: NEGATIVE

## 2024-07-17 MED ORDER — FLUCONAZOLE 150 MG PO TABS: 150.0000 mg | ORAL_TABLET | Freq: Once | ORAL | 0 refills | Status: AC

## 2024-07-17 MED ORDER — METRONIDAZOLE 500 MG PO TABS: 500.0000 mg | ORAL_TABLET | Freq: Two times a day (BID) | ORAL | 0 refills | Status: AC

## 2024-07-19 MED ORDER — METFORMIN HCL ER 750 MG PO TB24: 750.0000 mg | ORAL_TABLET | Freq: Every day | ORAL | 0 refills | Status: DC

## 2024-07-19 MED ORDER — VALACYCLOVIR HCL 1 G PO TABS: 1000.0000 mg | ORAL_TABLET | Freq: Two times a day (BID) | ORAL | 0 refills | Status: AC

## 2024-08-22 ENCOUNTER — Ambulatory Visit: Payer: MEDICAID | Admitting: Internal Medicine

## 2024-08-28 ENCOUNTER — Ambulatory Visit: Payer: Self-pay

## 2024-08-28 NOTE — Telephone Encounter (Signed)
 FYI Only or Action Required?: FYI only for provider: appointment scheduled on 08/29/2024.  Patient was last seen in primary care on 07/16/2024 by Bernardo Fend, DO.  Called Nurse Triage reporting No chief complaint on file..  Symptoms began a week ago.  Interventions attempted: Nothing.  Symptoms are: unchanged.  Triage Disposition: See PCP When Office is Open (Within 3 Days)  Patient/caregiver understands and will follow disposition?: Yes  Copied from CRM #[id number]. Topic: Clinical - Red Word Triage >> Aug 28, 2024 10:56 AM Joesph NOVAK wrote: Red Word that prompted transfer to Nurse Triage: itching. odor, a little bit of burning when urinating Reason for Disposition  Bad smelling vaginal discharge  Answer Assessment - Initial Assessment Questions 1. DISCHARGE: Describe the discharge. (e.g., white, yellow, green, gray, foamy, cottage cheese-like)     Brown color, thin 2. ODOR: Is there a bad odor?     yes 3. ONSET: When did the discharge begin?     08/21/2024 4. RASH: Is there a rash in the genital area? If Yes, ask: Describe it. (e.g., redness, blisters, sores, bumps)     Denies 5. ABDOMEN PAIN: Are you having any abdomen pain? If Yes, ask: What does it feel like?  (e.g., crampy, dull, intermittent, constant)      Denies 6. ABDOMEN PAIN SEVERITY: If present, ask: How bad is it? (e.g., Scale 1-10; mild, moderate, or severe)     0 7. CAUSE: What do you think is causing the discharge? Have you had the same problem before? What happened then?     Has Constant BV 8. OTHER SYMPTOMS: Do you have any other symptoms? (e.g., fever, itching, urination pain, vaginal bleeding, vaginal foreign body)     Burning when urinating,  Protocols used: Vaginal Discharge-A-AH

## 2024-08-29 ENCOUNTER — Telehealth: Payer: Self-pay | Admitting: Internal Medicine

## 2024-08-29 ENCOUNTER — Other Ambulatory Visit: Payer: Self-pay

## 2024-08-29 ENCOUNTER — Other Ambulatory Visit (HOSPITAL_COMMUNITY)
Admission: RE | Admit: 2024-08-29 | Discharge: 2024-08-29 | Disposition: A | Payer: MEDICAID | Source: Ambulatory Visit | Attending: Internal Medicine | Admitting: Internal Medicine

## 2024-08-29 ENCOUNTER — Telehealth: Payer: Self-pay | Admitting: Pharmacy Technician

## 2024-08-29 ENCOUNTER — Other Ambulatory Visit (HOSPITAL_COMMUNITY): Payer: Self-pay

## 2024-08-29 ENCOUNTER — Ambulatory Visit: Payer: MEDICAID | Admitting: Internal Medicine

## 2024-08-29 ENCOUNTER — Encounter: Payer: Self-pay | Admitting: Internal Medicine

## 2024-08-29 VITALS — BP 128/80 | HR 98 | Resp 16 | Ht 64.0 in | Wt 184.1 lb

## 2024-08-29 DIAGNOSIS — E1129 Type 2 diabetes mellitus with other diabetic kidney complication: Secondary | ICD-10-CM

## 2024-08-29 DIAGNOSIS — N898 Other specified noninflammatory disorders of vagina: Secondary | ICD-10-CM | POA: Insufficient documentation

## 2024-08-29 DIAGNOSIS — R809 Proteinuria, unspecified: Secondary | ICD-10-CM | POA: Diagnosis not present

## 2024-08-29 DIAGNOSIS — B009 Herpesviral infection, unspecified: Secondary | ICD-10-CM

## 2024-08-29 DIAGNOSIS — Z7984 Long term (current) use of oral hypoglycemic drugs: Secondary | ICD-10-CM

## 2024-08-29 DIAGNOSIS — I10 Essential (primary) hypertension: Secondary | ICD-10-CM

## 2024-08-29 MED ORDER — METFORMIN HCL ER 750 MG PO TB24: 750.0000 mg | ORAL_TABLET | Freq: Every day | ORAL | 1 refills | Status: AC

## 2024-08-29 MED ORDER — LISINOPRIL 40 MG PO TABS: 40.0000 mg | ORAL_TABLET | Freq: Every day | ORAL | 1 refills | Status: AC

## 2024-08-29 MED ORDER — TIRZEPATIDE 2.5 MG/0.5ML ~~LOC~~ SOAJ: 2.5000 mg | SUBCUTANEOUS | 1 refills | Status: AC

## 2024-08-29 MED ORDER — VALACYCLOVIR HCL 1 G PO TABS: 1000.0000 mg | ORAL_TABLET | Freq: Every day | ORAL | 0 refills | Status: AC

## 2024-08-29 NOTE — Progress Notes (Signed)
 Acute Office Visit  Subjective:     Patient ID: Kelly [name], female    DOB: [birth year], 52 y.o.   MRN: [id number]  Chief Complaint  Patient presents with   Vaginal Discharge    odor    Vaginal Discharge The patient's primary symptoms include vaginal discharge. Pertinent negatives include no chills, dysuria, fever, frequency, hematuria or urgency.   Patient is in today for change in vaginal discharge.   Discussed the use of AI scribe software for clinical note transcription with the patient, who gave verbal consent to proceed.  History of Present Illness Kelly [name] is a 53 year old female who presents for STD screening and management of her diabetes.  She reports vaginal odor similar to prior bacterial vaginosis with thicker discharge. She requests screening for gonorrhea, chlamydia, bacterial vaginosis, yeast, and trichomonas. She had positive HSV type 1 and type 2 tests in October and is aware of these results. She denies dysuria but notes foamy urine.  She has diabetes with A1c 10.0% in October and a prior blood glucose of 237 mg/dL. She was on Ozempic  but stopped due to headaches and is considering restarting. She is currently taking metformin  and lisinopril .   Review of Systems  Constitutional:  Negative for chills and fever.  Genitourinary:  Positive for vaginal discharge. Negative for dysuria, frequency, hematuria and urgency.        Objective:    BP 128/80 (Cuff Size: Large)   Pulse 98   Resp 16   Ht 5' 4 (1.626 m)   Wt 184 lb 1.6 oz (83.5 kg)   SpO2 98%   BMI 31.60 kg/m  BP Readings from Last 3 Encounters:  08/29/24 128/80  07/16/24 128/84  02/20/24 130/80   Wt Readings from Last 3 Encounters:  08/29/24 184 lb 1.6 oz (83.5 kg)  07/16/24 183 lb 11.2 oz (83.3 kg)  02/20/24 175 lb 12.8 oz (79.7 kg)      Physical Exam Constitutional:      Appearance: Normal appearance.  HENT:     Head: Normocephalic and atraumatic.  Eyes:      Conjunctiva/sclera: Conjunctivae normal.  Cardiovascular:     Rate and Rhythm: Normal rate and regular rhythm.  Pulmonary:     Effort: Pulmonary effort is normal.     Breath sounds: Normal breath sounds.  Skin:    General: Skin is warm and dry.  Neurological:     General: No focal deficit present.     Mental Status: She is alert. Mental status is at baseline.  Psychiatric:        Mood and Affect: Mood normal.        Behavior: Behavior normal.     No results found for any visits on 08/29/24.      Assessment & Plan:   Assessment & Plan Vaginal discharge and evaluation for sexually transmitted infections Recurrent discharge with odor suggests bacterial vaginosis. Differential includes yeast infection and trichomonas. Previous tests for HIV and syphilis were negative. - Performed swab test for gonorrhea, chlamydia, BV, yeast, and trichomonas. - Consider full STD panel if changes in sexual partners or comprehensive screening desired.  Type 2 diabetes mellitus with hyperglycemia A1c at 10.0 indicates poor control. Ozempic  discontinued due to headaches. Mounjaro  considered pending insurance. Insulin  possible if Mounjaro  not tolerated or covered. High microalbumin levels indicate risk for diabetic kidney disease. - Prescribed Mounjaro  1 mg weekly for two months. - Scheduled follow-up for A1c recheck on January 28th or later. -  Continue metformin . - Ensure daily lisinopril  for kidney protection.  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 Discussed outbreak nature and transmission. Emphasized early antiviral treatment to prevent progression. - Prescribed Valtrex  1 gram daily for five days at symptom onset. - Educated on HSV outbreak signs and early treatment importance.  Essential hypertension Blood pressure controlled at 120/80 mmHg with current regimen. - Continue lisinopril .  - Cervicovaginal ancillary only - valACYclovir  (VALTREX ) 1000 MG tablet;  Take 1 tablet (1,000 mg total) by mouth daily for 5 days.  Dispense: 5 tablet; Refill: 0 - tirzepatide  (MOUNJARO ) 2.5 MG/0.5ML Pen; Inject 2.5 mg into the skin once a week.  Dispense: 2 mL; Refill: 1 - metFORMIN  (GLUCOPHAGE -XR) 750 MG 24 hr tablet; Take 1 tablet (750 mg total) by mouth daily with breakfast.  Dispense: 90 tablet; Refill: 1 - lisinopril  (ZESTRIL ) 40 MG tablet; Take 1 tablet (40 mg total) by mouth daily.  Dispense: 90 tablet; Refill: 1    Return for please move 1/9 apt to after 1/28 for a1c thank you .  Sharyle Fischer, DO

## 2024-08-29 NOTE — Telephone Encounter (Signed)
 Already started

## 2024-08-29 NOTE — Telephone Encounter (Signed)
 Pharmacy Patient Advocate Encounter   Received notification from Onbase that prior authorization for Mounjaro  2.5MG /0.5ML auto-injectors is required/requested.   Insurance verification completed.   The patient is insured through [employer].   Per test claim: PA required; PA started via CoverMyMeds. KEY [id number] . Waiting for clinical questions to populate.

## 2024-08-29 NOTE — Telephone Encounter (Signed)
 Copied from CRM #[id number]. Topic: Clinical - Medication Prior Auth >> Aug 29, 2024  3:11 PM Kelly [name] wrote: Reason for CRM: Pt called stated pharmacy need a prior authorization for tirzepatide  (MOUNJARO ) 2.5 MG/0.5ML Pen.

## 2024-08-30 ENCOUNTER — Other Ambulatory Visit (HOSPITAL_COMMUNITY): Payer: Self-pay

## 2024-08-30 LAB — CERVICOVAGINAL ANCILLARY ONLY
Bacterial Vaginitis (gardnerella): NEGATIVE
Candida Glabrata: NEGATIVE
Candida Vaginitis: POSITIVE — AB
Chlamydia: NEGATIVE
Comment: NEGATIVE
Comment: NEGATIVE
Comment: NEGATIVE
Comment: NEGATIVE
Comment: NEGATIVE
Comment: NORMAL
Neisseria Gonorrhea: NEGATIVE
Trichomonas: NEGATIVE

## 2024-08-30 NOTE — Telephone Encounter (Signed)
 Pharmacy Patient Advocate Encounter  Received notification from [facility] that Prior Authorization for Mounjaro  2.5MG /0.5ML auto-injectors has been APPROVED from 08/28/24 to 08/28/25. Ran test claim, Copay is $4.00. This test claim was processed through [facility]- copay amounts may vary at other pharmacies due to pharmacy/plan contracts, or as the patient moves through the different stages of their insurance plan.   PA #/Case ID/Reference #: [id number]

## 2024-09-02 ENCOUNTER — Ambulatory Visit: Payer: Self-pay | Admitting: Internal Medicine

## 2024-09-02 DIAGNOSIS — B379 Candidiasis, unspecified: Secondary | ICD-10-CM

## 2024-09-02 MED ORDER — FLUCONAZOLE 150 MG PO TABS: 150.0000 mg | ORAL_TABLET | Freq: Once | ORAL | 0 refills | Status: AC

## 2024-09-27 ENCOUNTER — Ambulatory Visit: Payer: MEDICAID | Admitting: Internal Medicine

## 2024-10-16 ENCOUNTER — Ambulatory Visit: Payer: MEDICAID | Admitting: Internal Medicine
